# Patient Record
Sex: Male | Born: 1955 | Race: White | Hispanic: No | Marital: Single | Smoking: Current every day smoker
Health system: Southern US, Community
[De-identification: ages and names within clinical notes are randomized; demographics above are authoritative.]

## PROBLEM LIST (undated history)

## (undated) DIAGNOSIS — I4819 Other persistent atrial fibrillation: Secondary | ICD-10-CM

## (undated) DIAGNOSIS — K7460 Unspecified cirrhosis of liver: Secondary | ICD-10-CM

## (undated) DIAGNOSIS — I499 Cardiac arrhythmia, unspecified: Secondary | ICD-10-CM

## (undated) DIAGNOSIS — I10 Essential (primary) hypertension: Secondary | ICD-10-CM

## (undated) DIAGNOSIS — I495 Sick sinus syndrome: Secondary | ICD-10-CM

## (undated) DIAGNOSIS — B1920 Unspecified viral hepatitis C without hepatic coma: Secondary | ICD-10-CM

## (undated) DIAGNOSIS — S2249XA Multiple fractures of ribs, unspecified side, initial encounter for closed fracture: Secondary | ICD-10-CM

## (undated) DIAGNOSIS — J449 Chronic obstructive pulmonary disease, unspecified: Secondary | ICD-10-CM

## (undated) DIAGNOSIS — I4891 Unspecified atrial fibrillation: Secondary | ICD-10-CM

## (undated) DIAGNOSIS — I700 Atherosclerosis of aorta: Secondary | ICD-10-CM

## (undated) DIAGNOSIS — W19XXXA Unspecified fall, initial encounter: Secondary | ICD-10-CM

## (undated) DIAGNOSIS — J189 Pneumonia, unspecified organism: Secondary | ICD-10-CM

## (undated) DIAGNOSIS — I739 Peripheral vascular disease, unspecified: Secondary | ICD-10-CM

## (undated) DIAGNOSIS — R0600 Dyspnea, unspecified: Secondary | ICD-10-CM

## (undated) DIAGNOSIS — Z720 Tobacco use: Secondary | ICD-10-CM

## (undated) DIAGNOSIS — E278 Other specified disorders of adrenal gland: Secondary | ICD-10-CM

## (undated) DIAGNOSIS — C220 Liver cell carcinoma: Secondary | ICD-10-CM

## (undated) DIAGNOSIS — S2239XA Fracture of one rib, unspecified side, initial encounter for closed fracture: Secondary | ICD-10-CM

## (undated) HISTORY — DX: Unspecified viral hepatitis C without hepatic coma: B19.20

## (undated) HISTORY — DX: Tobacco use: Z72.0

## (undated) HISTORY — DX: Other persistent atrial fibrillation: I48.19

## (undated) HISTORY — DX: Unspecified cirrhosis of liver: K74.60

## (undated) SURGERY — Surgical Case
Anesthesia: *Unknown

---

## 1987-09-04 HISTORY — PX: OTHER SURGICAL HISTORY: SHX169

## 2009-09-03 HISTORY — PX: COLONOSCOPY: SHX174

## 2010-03-27 ENCOUNTER — Ambulatory Visit: Payer: Self-pay | Admitting: Gastroenterology

## 2010-03-27 DIAGNOSIS — M1990 Unspecified osteoarthritis, unspecified site: Secondary | ICD-10-CM

## 2010-03-27 DIAGNOSIS — R151 Fecal smearing: Secondary | ICD-10-CM | POA: Insufficient documentation

## 2010-03-27 DIAGNOSIS — Z8679 Personal history of other diseases of the circulatory system: Secondary | ICD-10-CM

## 2010-03-27 DIAGNOSIS — R972 Elevated prostate specific antigen [PSA]: Secondary | ICD-10-CM

## 2010-03-27 DIAGNOSIS — K625 Hemorrhage of anus and rectum: Secondary | ICD-10-CM

## 2010-03-28 ENCOUNTER — Encounter: Payer: Self-pay | Admitting: Gastroenterology

## 2010-03-28 ENCOUNTER — Encounter: Payer: Self-pay | Admitting: Urgent Care

## 2010-03-29 ENCOUNTER — Ambulatory Visit: Payer: Self-pay | Admitting: Gastroenterology

## 2010-03-29 ENCOUNTER — Ambulatory Visit (HOSPITAL_COMMUNITY): Admission: RE | Admit: 2010-03-29 | Discharge: 2010-03-29 | Payer: Self-pay | Admitting: Gastroenterology

## 2010-03-30 ENCOUNTER — Encounter: Payer: Self-pay | Admitting: Gastroenterology

## 2010-10-03 NOTE — Letter (Signed)
Summary: Internal Other Jarrett Ables for TCS  Internal Other Jarrett Ables for TCS   Imported By: Cloria Spring LPN 60/45/4098 11:91:47  _____________________________________________________________________  External Attachment:    Type:   Image     Comment:   External Document

## 2010-10-03 NOTE — Letter (Signed)
Summary: referral from debra allen,fnp  referral from debra allen,fnp   Imported By: Rosine Beat 03/28/2010 10:08:03  _____________________________________________________________________  External Attachment:    Type:   Image     Comment:   External Document
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
## 2010-10-03 NOTE — Medication Information (Signed)
Summary: Tax adviser   Imported By: Rexene Alberts 03/30/2010 10:35:02  _____________________________________________________________________  External Attachment:    Type:   Image     Comment:   External Document  Appended Document: RX Folder-change anusol    Prescriptions: HYDROCORTISONE ACETATE 25 MG SUPP (HYDROCORTISONE ACETATE) one pr two times a day for 30 days  #60 x 0   Entered and Authorized by:   Leanna Battles. Dixon Boos   Signed by:   Leanna Battles Aarion Metzgar PA-C on 03/30/2010   Method used:   Electronically to        [facility]  [address] (retail)       [address]       [place], [place]  [address]       Ph: [phone]       Fax: [phone]   RxID:   [id number]     Appended Document: RX Folder Please call pt. He had a two simple polyps removed from his colon. TCS in 5 years. High fiber diet.   Appended Document: RX Folder LM to call.  Appended Document: RX Folder Pt informed.  Appended Document: RX Folder reminder in computer

## 2013-05-27 ENCOUNTER — Emergency Department (HOSPITAL_COMMUNITY)
Admission: EM | Admit: 2013-05-27 | Discharge: 2013-05-28 | Disposition: A | Discharge status: Home / Self Care | Payer: Self-pay | Attending: Emergency Medicine | Admitting: Emergency Medicine

## 2013-05-27 ENCOUNTER — Encounter (HOSPITAL_COMMUNITY): Payer: Self-pay

## 2013-05-27 DIAGNOSIS — Y939 Activity, unspecified: Secondary | ICD-10-CM | POA: Insufficient documentation

## 2013-05-27 DIAGNOSIS — W010XXA Fall on same level from slipping, tripping and stumbling without subsequent striking against object, initial encounter: Secondary | ICD-10-CM | POA: Insufficient documentation

## 2013-05-27 DIAGNOSIS — S43109A Unspecified dislocation of unspecified acromioclavicular joint, initial encounter: Secondary | ICD-10-CM | POA: Insufficient documentation

## 2013-05-27 DIAGNOSIS — Y929 Unspecified place or not applicable: Secondary | ICD-10-CM | POA: Insufficient documentation

## 2013-05-27 DIAGNOSIS — S43004A Unspecified dislocation of right shoulder joint, initial encounter: Secondary | ICD-10-CM

## 2013-05-27 HISTORY — DX: Essential (primary) hypertension: I10
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
## 2013-05-27 NOTE — ED Notes (Signed)
Pt reports he fell onto his right shoulder area approx 1 hour ago, having pain and decreased range of motion to same.

## 2013-05-28 ENCOUNTER — Emergency Department (HOSPITAL_COMMUNITY): Payer: Self-pay

## 2013-05-28 MED ORDER — HYDROCODONE-ACETAMINOPHEN 5-325 MG PO TABS: 2.0000 | ORAL_TABLET | ORAL | Status: DC | PRN

## 2013-05-28 MED ORDER — HYDROCODONE-ACETAMINOPHEN 5-325 MG PO TABS
2.0000 | ORAL_TABLET | Freq: Once | ORAL | Status: AC
  Administered 2013-05-28: 2 via ORAL
  Filled 2013-05-28: qty 2

## 2015-02-05 ENCOUNTER — Emergency Department (HOSPITAL_COMMUNITY): Payer: Medicaid Other

## 2015-02-05 ENCOUNTER — Encounter (HOSPITAL_COMMUNITY): Payer: Self-pay | Admitting: *Deleted

## 2015-02-05 ENCOUNTER — Emergency Department (HOSPITAL_COMMUNITY)
Admission: EM | Admit: 2015-02-05 | Discharge: 2015-02-05 | Disposition: A | Discharge status: Home / Self Care | Payer: Medicaid Other | Attending: Emergency Medicine | Admitting: Emergency Medicine

## 2015-02-05 DIAGNOSIS — Z89512 Acquired absence of left leg below knee: Secondary | ICD-10-CM | POA: Diagnosis not present

## 2015-02-05 DIAGNOSIS — R079 Chest pain, unspecified: Secondary | ICD-10-CM

## 2015-02-05 DIAGNOSIS — I10 Essential (primary) hypertension: Secondary | ICD-10-CM | POA: Diagnosis not present

## 2015-02-05 DIAGNOSIS — S42002A Fracture of unspecified part of left clavicle, initial encounter for closed fracture: Secondary | ICD-10-CM

## 2015-02-05 DIAGNOSIS — S42022A Displaced fracture of shaft of left clavicle, initial encounter for closed fracture: Secondary | ICD-10-CM | POA: Diagnosis not present

## 2015-02-05 DIAGNOSIS — Y92410 Unspecified street and highway as the place of occurrence of the external cause: Secondary | ICD-10-CM | POA: Diagnosis not present

## 2015-02-05 DIAGNOSIS — Y9389 Activity, other specified: Secondary | ICD-10-CM | POA: Diagnosis not present

## 2015-02-05 DIAGNOSIS — Z720 Tobacco use: Secondary | ICD-10-CM | POA: Insufficient documentation

## 2015-02-05 DIAGNOSIS — S2242XA Multiple fractures of ribs, left side, initial encounter for closed fracture: Secondary | ICD-10-CM | POA: Insufficient documentation

## 2015-02-05 DIAGNOSIS — S2232XA Fracture of one rib, left side, initial encounter for closed fracture: Secondary | ICD-10-CM

## 2015-02-05 DIAGNOSIS — Y998 Other external cause status: Secondary | ICD-10-CM | POA: Insufficient documentation

## 2015-02-05 DIAGNOSIS — S29001A Unspecified injury of muscle and tendon of front wall of thorax, initial encounter: Secondary | ICD-10-CM | POA: Diagnosis present

## 2015-02-05 LAB — CBC WITH DIFFERENTIAL/PLATELET
BASOS PCT: 1 % (ref 0–1)
Basophils Absolute: 0.1 10*3/uL (ref 0.0–0.1)
Eosinophils Absolute: 0 10*3/uL (ref 0.0–0.7)
Eosinophils Relative: 0 % (ref 0–5)
HEMATOCRIT: 50.3 % (ref 39.0–52.0)
Hemoglobin: 17.4 g/dL — ABNORMAL HIGH (ref 13.0–17.0)
LYMPHS ABS: 1.5 10*3/uL (ref 0.7–4.0)
Lymphocytes Relative: 15 % (ref 12–46)
MCH: 38.2 pg — AB (ref 26.0–34.0)
MCHC: 34.6 g/dL (ref 30.0–36.0)
MCV: 110.5 fL — AB (ref 78.0–100.0)
Monocytes Absolute: 0.7 10*3/uL (ref 0.1–1.0)
Monocytes Relative: 7 % (ref 3–12)
Neutro Abs: 8 10*3/uL — ABNORMAL HIGH (ref 1.7–7.7)
Neutrophils Relative %: 78 % — ABNORMAL HIGH (ref 43–77)
PLATELETS: 162 10*3/uL (ref 150–400)
RBC: 4.55 MIL/uL (ref 4.22–5.81)
RDW: 13.1 % (ref 11.5–15.5)
WBC: 10.3 10*3/uL (ref 4.0–10.5)

## 2015-02-05 LAB — BASIC METABOLIC PANEL
Anion gap: 14 (ref 5–15)
BUN: 7 mg/dL (ref 6–20)
CALCIUM: 8.8 mg/dL — AB (ref 8.9–10.3)
CHLORIDE: 104 mmol/L (ref 101–111)
CO2: 22 mmol/L (ref 22–32)
CREATININE: 0.99 mg/dL (ref 0.61–1.24)
GFR calc Af Amer: >60 mL/min (ref >60)
GFR calc non Af Amer: >60 mL/min (ref >60)
GLUCOSE: 178 mg/dL — AB (ref 65–99)
POTASSIUM: 3.8 mmol/L (ref 3.5–5.1)
Sodium: 140 mmol/L (ref 135–145)

## 2015-02-05 LAB — ETHANOL: ALCOHOL ETHYL (B): 361 mg/dL — AB (ref <5)

## 2015-02-05 MED ORDER — MORPHINE SULFATE 4 MG/ML IJ SOLN
4.0000 mg | Freq: Once | INTRAMUSCULAR | Status: AC
  Administered 2015-02-05: 4 mg via INTRAVENOUS

## 2015-02-05 MED ORDER — MORPHINE SULFATE 4 MG/ML IJ SOLN
4.0000 mg | INTRAMUSCULAR | Status: DC | PRN
  Administered 2015-02-05: 4 mg via INTRAVENOUS
  Filled 2015-02-05 (×2): qty 1

## 2015-02-05 MED ORDER — OXYCODONE-ACETAMINOPHEN 5-325 MG PO TABS: 2.0000 | ORAL_TABLET | ORAL | Status: DC | PRN

## 2015-02-05 MED ORDER — ONDANSETRON HCL 4 MG/2ML IJ SOLN
4.0000 mg | Freq: Once | INTRAMUSCULAR | Status: AC
  Administered 2015-02-05: 4 mg via INTRAVENOUS
  Filled 2015-02-05: qty 2
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
## 2015-02-05 NOTE — ED Notes (Signed)
Crepitus noted on left side.
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
## 2015-02-05 NOTE — Discharge Instructions (Signed)
Clavicle Fracture °The clavicle, also called the collarbone, is the long bone that connects your shoulder to your rib cage. You can feel your collarbone at the top of your shoulders and rib cage. A clavicle fracture is a broken clavicle. It is a common injury that can happen at any age.  °CAUSES °Common causes of a clavicle fracture include: °· A direct blow to your shoulder. °· A car accident. °· A fall, especially if you try to break your fall with an outstretched arm. °RISK FACTORS °You may be at increased risk if: °· You are younger than 25 years or older than 75 years. Most clavicle fractures happen to people who are younger than 25 years. °· You are a male. °· You play contact sports. °SIGNS AND SYMPTOMS °A fractured clavicle is painful. It also makes it hard to move your arm. Other signs and symptoms may include: °· A shoulder that drops downward and forward. °· Pain when trying to lift your shoulder. °· Bruising, swelling, and tenderness over your clavicle. °· A grinding noise when you try to move your shoulder. °· A bump over your clavicle. °DIAGNOSIS °Your health care provider can usually diagnose a clavicle fracture by asking about your injury and examining your shoulder and clavicle. He or she may take an X-ray to determine the position of your clavicle. °TREATMENT °Treatment depends on the position of your clavicle after the fracture: °· If the broken ends of the bone are not out of place, your health care provider may put your arm in a sling or wrap a support bandage around your chest (figure-of-eight wrap). °· If the broken ends of the bone are out of place, you may need surgery. Surgery may involve placing screws, pins, or plates to keep your clavicle stable while it heals. Healing may take about 3 months. °When your health care provider thinks your fracture has healed enough, you may have to do physical therapy to regain normal movement and build up your arm strength. °HOME CARE INSTRUCTIONS   °· Apply ice to the injured area: °¨ Put ice in a plastic bag. °¨ Place a towel between your skin and the bag. °¨ Leave the ice on for 20 minutes, 2-3 times a day. °· If you have a wrap or splint: °¨ Wear it all the time, and remove it only to take a bath or shower. °¨ When you bathe or shower, keep your shoulder in the same position as when the sling or wrap is on. °¨ Do not lift your arm. °· If you have a figure-of-eight wrap: °¨ Another person must tighten it every day. °¨ It should be tight enough to hold your shoulders back. °¨ Allow enough room to place your index finger between your body and the strap. °¨ Loosen the wrap immediately if you feel numbness or tingling in your hands. °· Only take medicines as directed by your health care provider. °· Avoid activities that make the injury or pain worse for 4-6 weeks after surgery. °· Keep all follow-up appointments. °SEEK MEDICAL CARE IF:  °Your medicine is not helping to relieve pain and swelling. °SEEK IMMEDIATE MEDICAL CARE IF:  °Your arm is numb, cold, or pale, even when the splint is loose. °MAKE SURE YOU:  °· Understand these instructions. °· Will watch your condition. °· Will get help right away if you are not doing well or get worse. °Document Released: 05/30/2005 Document Revised: 08/25/2013 Document Reviewed: 07/13/2013 °[employer]® Patient Information ©2015 [employer]. This information is   not intended to replace advice given to you by your health care provider. Make sure you discuss any questions you have with your health care provider.  Rib Fracture A rib fracture is a break or crack in one of the bones of the ribs. The ribs are like a cage that goes around your upper chest. A broken or cracked rib is often painful, but most do not cause other problems. Most rib fractures heal on their own in 1-3 months. HOME CARE  Avoid activities that cause pain to the injured area. Protect your injured area.  Slowly increase activity as told by your  doctor.  Take medicine as told by your doctor.  Put ice on the injured area for the first 1-2 days after you have been treated or as told by your doctor.  Put ice in a plastic bag.  Place a towel between your skin and the bag.  Leave the ice on for 15-20 minutes at a time, every 2 hours while you are awake.  Do deep breathing as told by your doctor. You may be told to:  Take deep breaths many times a day.  Cough many times a day while hugging a pillow.  Use a device (incentive spirometer) to perform deep breathing many times a day.  Drink enough fluids to keep your pee (urine) clear or pale yellow.   Do not wear a rib belt or binder. These do not allow you to breathe deeply. GET HELP RIGHT AWAY IF:   You have a fever.  You have trouble breathing.   You cannot stop coughing.  You cough up thick or bloody spit (mucus).   You feel sick to your stomach (nauseous), throw up (vomit), or have belly (abdominal) pain.   Your pain gets worse and medicine does not help.  MAKE SURE YOU:   Understand these instructions.  Will watch your condition.  Will get help right away if you are not doing well or get worse. Document Released: 05/29/2008 Document Revised: 12/15/2012 Document Reviewed: 10/22/2012 [facility] Patient Information 2015 [place], [place]. This information is not intended to replace advice given to you by your health care provider. Make sure you discuss any questions you have with your health care provider.

## 2015-03-21 ENCOUNTER — Encounter: Payer: Self-pay | Admitting: Gastroenterology

## 2015-03-21 ENCOUNTER — Encounter: Payer: Self-pay | Admitting: Internal Medicine

## 2015-12-08 ENCOUNTER — Other Ambulatory Visit (HOSPITAL_COMMUNITY)
Admission: RE | Admit: 2015-12-08 | Discharge: 2015-12-08 | Disposition: A | Discharge status: Home / Self Care | Payer: Medicaid Other | Source: Ambulatory Visit | Attending: Internal Medicine | Admitting: Internal Medicine

## 2015-12-08 DIAGNOSIS — B1710 Acute hepatitis C without hepatic coma: Secondary | ICD-10-CM | POA: Insufficient documentation

## 2015-12-11 LAB — HEPATITIS C GENOTYPE

## 2016-03-23 ENCOUNTER — Emergency Department (HOSPITAL_COMMUNITY): Payer: Medicaid Other

## 2016-03-23 ENCOUNTER — Inpatient Hospital Stay (HOSPITAL_COMMUNITY)
Admission: EM | Admit: 2016-03-23 | Discharge: 2016-03-25 | DRG: 310 | Disposition: A | Discharge status: Home / Self Care | Payer: Medicaid Other | Attending: Internal Medicine | Admitting: Internal Medicine

## 2016-03-23 ENCOUNTER — Encounter (HOSPITAL_COMMUNITY): Payer: Self-pay | Admitting: Emergency Medicine

## 2016-03-23 DIAGNOSIS — M1990 Unspecified osteoarthritis, unspecified site: Secondary | ICD-10-CM | POA: Diagnosis present

## 2016-03-23 DIAGNOSIS — Z86711 Personal history of pulmonary embolism: Secondary | ICD-10-CM | POA: Diagnosis not present

## 2016-03-23 DIAGNOSIS — E876 Hypokalemia: Secondary | ICD-10-CM | POA: Diagnosis present

## 2016-03-23 DIAGNOSIS — Z8679 Personal history of other diseases of the circulatory system: Secondary | ICD-10-CM | POA: Diagnosis not present

## 2016-03-23 DIAGNOSIS — I952 Hypotension due to drugs: Secondary | ICD-10-CM | POA: Diagnosis present

## 2016-03-23 DIAGNOSIS — I4891 Unspecified atrial fibrillation: Principal | ICD-10-CM | POA: Diagnosis present

## 2016-03-23 DIAGNOSIS — Z8249 Family history of ischemic heart disease and other diseases of the circulatory system: Secondary | ICD-10-CM

## 2016-03-23 DIAGNOSIS — Z89512 Acquired absence of left leg below knee: Secondary | ICD-10-CM | POA: Diagnosis not present

## 2016-03-23 DIAGNOSIS — Z720 Tobacco use: Secondary | ICD-10-CM | POA: Diagnosis present

## 2016-03-23 DIAGNOSIS — I10 Essential (primary) hypertension: Secondary | ICD-10-CM | POA: Diagnosis present

## 2016-03-23 DIAGNOSIS — T461X5A Adverse effect of calcium-channel blockers, initial encounter: Secondary | ICD-10-CM | POA: Diagnosis present

## 2016-03-23 DIAGNOSIS — F17210 Nicotine dependence, cigarettes, uncomplicated: Secondary | ICD-10-CM | POA: Diagnosis present

## 2016-03-23 DIAGNOSIS — I959 Hypotension, unspecified: Secondary | ICD-10-CM | POA: Diagnosis not present

## 2016-03-23 HISTORY — DX: Unspecified atrial fibrillation: I48.91

## 2016-03-23 LAB — BASIC METABOLIC PANEL
ANION GAP: 6 (ref 5–15)
BUN: 12 mg/dL (ref 6–20)
CHLORIDE: 104 mmol/L (ref 101–111)
CO2: 24 mmol/L (ref 22–32)
Calcium: 8.6 mg/dL — ABNORMAL LOW (ref 8.9–10.3)
Creatinine, Ser: 1.27 mg/dL — ABNORMAL HIGH (ref 0.61–1.24)
GFR calc non Af Amer: 60 mL/min — ABNORMAL LOW (ref >60)
Glucose, Bld: 98 mg/dL (ref 65–99)
POTASSIUM: 3.2 mmol/L — AB (ref 3.5–5.1)
Sodium: 134 mmol/L — ABNORMAL LOW (ref 135–145)

## 2016-03-23 LAB — CBC
HCT: 45.6 % (ref 39.0–52.0)
HEMOGLOBIN: 16.1 g/dL (ref 13.0–17.0)
MCH: 35.3 pg — AB (ref 26.0–34.0)
MCHC: 35.3 g/dL (ref 30.0–36.0)
MCV: 100 fL (ref 78.0–100.0)
Platelets: 236 10*3/uL (ref 150–400)
RBC: 4.56 MIL/uL (ref 4.22–5.81)
RDW: 13.4 % (ref 11.5–15.5)
WBC: 7.3 10*3/uL (ref 4.0–10.5)

## 2016-03-23 LAB — TROPONIN I: Troponin I: <0.03 ng/mL (ref <0.03)

## 2016-03-23 MED ORDER — DILTIAZEM LOAD VIA INFUSION
10.0000 mg | Freq: Once | INTRAVENOUS | Status: AC
  Administered 2016-03-23: 10 mg via INTRAVENOUS

## 2016-03-23 MED ORDER — DILTIAZEM LOAD VIA INFUSION
10.0000 mg | Freq: Once | INTRAVENOUS | Status: AC
  Administered 2016-03-23: 10 mg via INTRAVENOUS
  Filled 2016-03-23: qty 10

## 2016-03-23 MED ORDER — SODIUM CHLORIDE 0.9% FLUSH
3.0000 mL | Freq: Two times a day (BID) | INTRAVENOUS | Status: DC
  Administered 2016-03-24 – 2016-03-25 (×2): 3 mL via INTRAVENOUS

## 2016-03-23 MED ORDER — VITAMIN D 1000 UNITS PO TABS
2000.0000 [IU] | ORAL_TABLET | Freq: Every day | ORAL | Status: DC
Start: 2016-03-24 — End: 2016-03-25
  Administered 2016-03-24 – 2016-03-25 (×2): 2000 [IU] via ORAL
  Filled 2016-03-23 (×2): qty 2

## 2016-03-23 MED ORDER — MONTELUKAST SODIUM 10 MG PO TABS
10.0000 mg | ORAL_TABLET | Freq: Every day | ORAL | Status: DC
  Administered 2016-03-24 – 2016-03-25 (×2): 10 mg via ORAL
  Filled 2016-03-23 (×2): qty 1

## 2016-03-23 MED ORDER — SODIUM CHLORIDE 0.9 % IV SOLN
INTRAVENOUS | Status: DC
  Administered 2016-03-23: 23:00:00 via INTRAVENOUS

## 2016-03-23 MED ORDER — ENOXAPARIN SODIUM 80 MG/0.8ML ~~LOC~~ SOLN
1.0000 mg/kg | Freq: Two times a day (BID) | SUBCUTANEOUS | Status: DC
  Administered 2016-03-23 – 2016-03-24 (×2): 70 mg via SUBCUTANEOUS
  Filled 2016-03-23 (×2): qty 0.8

## 2016-03-23 MED ORDER — ONDANSETRON HCL 4 MG PO TABS: 4.0000 mg | ORAL_TABLET | Freq: Four times a day (QID) | ORAL | Status: DC | PRN

## 2016-03-23 MED ORDER — ATENOLOL 25 MG PO TABS
100.0000 mg | ORAL_TABLET | Freq: Every day | ORAL | Status: DC
  Filled 2016-03-23: qty 4

## 2016-03-23 MED ORDER — VITAMIN D 50 MCG (2000 UT) PO CAPS: 1.0000 | ORAL_CAPSULE | Freq: Every day | ORAL | Status: DC

## 2016-03-23 MED ORDER — ONDANSETRON HCL 4 MG/2ML IJ SOLN: 4.0000 mg | Freq: Four times a day (QID) | INTRAMUSCULAR | Status: DC | PRN

## 2016-03-23 MED ORDER — PNEUMOCOCCAL VAC POLYVALENT 25 MCG/0.5ML IJ INJ
0.5000 mL | INJECTION | INTRAMUSCULAR | Status: AC
Start: 2016-03-24 — End: 2016-03-24
  Administered 2016-03-24: 0.5 mL via INTRAMUSCULAR
  Filled 2016-03-23: qty 0.5

## 2016-03-23 MED ORDER — ENOXAPARIN SODIUM 80 MG/0.8ML ~~LOC~~ SOLN: 1.0000 mg/kg | Freq: Two times a day (BID) | SUBCUTANEOUS | Status: DC

## 2016-03-23 MED ORDER — DEXTROSE 5 % IV SOLN
5.0000 mg/h | INTRAVENOUS | Status: DC
  Administered 2016-03-23 – 2016-03-24 (×2): 5 mg/h via INTRAVENOUS
  Filled 2016-03-23 (×2): qty 100

## 2016-03-23 MED ORDER — POTASSIUM CHLORIDE CRYS ER 20 MEQ PO TBCR
40.0000 meq | EXTENDED_RELEASE_TABLET | Freq: Once | ORAL | Status: AC
  Administered 2016-03-23: 40 meq via ORAL
  Filled 2016-03-23: qty 2

## 2016-03-23 MED ORDER — ALBUTEROL SULFATE (2.5 MG/3ML) 0.083% IN NEBU: 3.0000 mL | INHALATION_SOLUTION | RESPIRATORY_TRACT | Status: DC | PRN

## 2016-03-23 MED ORDER — SODIUM CHLORIDE 0.9 % IV BOLUS (SEPSIS)
1000.0000 mL | Freq: Once | INTRAVENOUS | Status: AC
  Administered 2016-03-23: 1000 mL via INTRAVENOUS

## 2016-03-23 MED ORDER — ASPIRIN EC 81 MG PO TBEC
81.0000 mg | DELAYED_RELEASE_TABLET | Freq: Every day | ORAL | Status: DC
  Administered 2016-03-23 – 2016-03-25 (×3): 81 mg via ORAL
  Filled 2016-03-23 (×3): qty 1

## 2016-03-23 NOTE — ED Provider Notes (Signed)
CSN: [id number]     Arrival date & time 03/23/16  1821 History   First MD Initiated Contact with Patient 03/23/16 1846     Chief Complaint  Patient presents with  . Weakness     (Consider location/radiation/quality/duration/timing/severity/associated sxs/prior Treatment) HPI...Alejandro KitchenMarland KitchenPatient went to primary care doctor this week for routine testing for generalized weakness for 2 months. An EKG was performed. He was instructed to come to the emergency department for an abnormal EKG. He has never been in atrial fibrillation before. No chest pain or dyspnea. No prodromal illnesses. Nothing makes symptoms better or worse.  Past Medical History  Diagnosis Date  . Hypertension    Past Surgical History  Procedure Laterality Date  . Left bka     Family History  Problem Relation Age of Onset  . Hypertension Mother    Social History  Substance Use Topics  . Smoking status: Current Every Day Smoker -- 1.00 packs/day    Types: Cigarettes  . Smokeless tobacco: None  . Alcohol Use: No    Review of Systems    Allergies  Review of patient's allergies indicates no known allergies.  Home Medications   Prior to Admission medications   Medication Sig Start Date End Date Taking? Authorizing Provider  atenolol-chlorthalidone (TENORETIC) 100-25 MG tablet Take 1 tablet by mouth daily.   Yes Historical Provider, MD  Cholecalciferol (VITAMIN D) 2000 units CAPS Take 1 capsule by mouth daily.   Yes Historical Provider, MD  hydrOXYzine (VISTARIL) 50 MG capsule Take 50 mg by mouth 2 (two) times daily as needed (for rest).  03/19/16  Yes Historical Provider, MD  montelukast (SINGULAIR) 10 MG tablet Take 10 mg by mouth daily.   Yes Historical Provider, MD  Multiple Vitamins-Minerals (CENTRUM SILVER 50+MEN) TABS Take 1 tablet by mouth daily.   Yes Historical Provider, MD  PROAIR HFA 108 (90 Base) MCG/ACT inhaler Inhale 1-2 puffs into the lungs every 4 (four) hours as needed. 03/19/16  Yes Historical  Provider, MD   BP 105/84 mmHg  Pulse 138  Temp(Src) 98.5 F (36.9 C) (Oral)  Resp 15  Ht 5\' 9"  (1.753 m)  Wt 159 lb (72.122 kg)  BMI 23.47 kg/m2  SpO2 96% Physical Exam  Constitutional: He is oriented to person, place, and time. He appears well-developed and well-nourished.  HENT:  Head: Normocephalic and atraumatic.  Eyes: Conjunctivae are normal.  Neck: Neck supple.  Cardiovascular:  Tachycardic, irregularly irregular rhythm.  Pulmonary/Chest: Effort normal and breath sounds normal.  Abdominal: Soft. Bowel sounds are normal.  Musculoskeletal: Normal range of motion.  Neurological: He is alert and oriented to person, place, and time.  Skin: Skin is warm and dry.  Psychiatric: He has a normal mood and affect. His behavior is normal.  Nursing note and vitals reviewed.   ED Course  Procedures (including critical care time) Labs Review Labs Reviewed  BASIC METABOLIC PANEL - Abnormal; Notable for the following:    Sodium 134 (*)    Potassium 3.2 (*)    Creatinine, Ser 1.27 (*)    Calcium 8.6 (*)    GFR calc non Af Amer 60 (*)    All other components within normal limits  CBC - Abnormal; Notable for the following:    MCH 35.3 (*)    All other components within normal limits  TROPONIN I    Imaging Review No results found. I have personally reviewed and evaluated these images and lab results as part of my medical decision-making.   EKG  Interpretation None     CRITICAL CARE Performed by: Nat Christen Total critical care time: 30 minutes Critical care time was exclusive of separately billable procedures and treating other patients. Critical care was necessary to treat or prevent imminent or life-threatening deterioration. Critical care was time spent personally by me on the following activities: development of treatment plan with patient and/or surrogate as well as nursing, discussions with consultants, evaluation of patient's response to treatment, examination of  patient, obtaining history from patient or surrogate, ordering and performing treatments and interventions, ordering and review of laboratory studies, ordering and review of radiographic studies, pulse oximetry and re-evaluation of patient's condition. MDM   Final diagnoses:  Atrial fibrillation with rapid ventricular response ([place])    Patient is in atrial fibrillation with RVR. We'll start intravenous Cardizem with bolus and drip. Admit to general medicine. He is hemodynamically stable.    Nat Christen, MD 03/23/16 2023
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
## 2016-03-23 NOTE — ED Notes (Signed)
Sent by Dr Marlou Sa.  Pt states MD told him to come to er today, that he saw something that didn't look right on his ekg.  Pt denies cp or sob.  States he went to dr for generalized weakness he has been having for approx 2 months.

## 2016-03-24 ENCOUNTER — Inpatient Hospital Stay (HOSPITAL_COMMUNITY): Payer: Medicaid Other

## 2016-03-24 DIAGNOSIS — Z720 Tobacco use: Secondary | ICD-10-CM | POA: Diagnosis present

## 2016-03-24 DIAGNOSIS — I4891 Unspecified atrial fibrillation: Secondary | ICD-10-CM

## 2016-03-24 DIAGNOSIS — I10 Essential (primary) hypertension: Secondary | ICD-10-CM | POA: Diagnosis present

## 2016-03-24 DIAGNOSIS — I959 Hypotension, unspecified: Secondary | ICD-10-CM | POA: Diagnosis not present

## 2016-03-24 DIAGNOSIS — I952 Hypotension due to drugs: Secondary | ICD-10-CM

## 2016-03-24 DIAGNOSIS — E876 Hypokalemia: Secondary | ICD-10-CM

## 2016-03-24 LAB — CBC
HCT: 40.3 % (ref 39.0–52.0)
HEMOGLOBIN: 14.1 g/dL (ref 13.0–17.0)
MCH: 35.4 pg — AB (ref 26.0–34.0)
MCHC: 35 g/dL (ref 30.0–36.0)
MCV: 101.3 fL — ABNORMAL HIGH (ref 78.0–100.0)
PLATELETS: 177 10*3/uL (ref 150–400)
RBC: 3.98 MIL/uL — ABNORMAL LOW (ref 4.22–5.81)
RDW: 13.7 % (ref 11.5–15.5)
WBC: 6.3 10*3/uL (ref 4.0–10.5)

## 2016-03-24 LAB — COMPREHENSIVE METABOLIC PANEL
ALBUMIN: 2.9 g/dL — AB (ref 3.5–5.0)
ALK PHOS: 67 U/L (ref 38–126)
ALT: 26 U/L (ref 17–63)
ANION GAP: 3 — AB (ref 5–15)
AST: 33 U/L (ref 15–41)
BILIRUBIN TOTAL: 0.5 mg/dL (ref 0.3–1.2)
BUN: 9 mg/dL (ref 6–20)
CALCIUM: 8.1 mg/dL — AB (ref 8.9–10.3)
CO2: 28 mmol/L (ref 22–32)
CREATININE: 0.84 mg/dL (ref 0.61–1.24)
Chloride: 106 mmol/L (ref 101–111)
GFR calc Af Amer: >60 mL/min (ref >60)
GFR calc non Af Amer: >60 mL/min (ref >60)
GLUCOSE: 74 mg/dL (ref 65–99)
Potassium: 3.4 mmol/L — ABNORMAL LOW (ref 3.5–5.1)
Sodium: 137 mmol/L (ref 135–145)
TOTAL PROTEIN: 6.1 g/dL — AB (ref 6.5–8.1)

## 2016-03-24 LAB — ECHOCARDIOGRAM COMPLETE
CHL CUP MV DEC (S): 116
E decel time: 116 msec
E/e' ratio: 6.6
FS: 19 % — AB (ref 28–44)
Height: 69 in
IV/PV OW: 0.97
LA diam index: 1.83 cm/m2
LA vol A4C: 46 ml
LA vol: 39.4 mL
LASIZE: 34 mm
LAVOLIN: 21.2 mL/m2
LEFT ATRIUM END SYS DIAM: 34 mm
LV TDI E'MEDIAL: 11.2
LV e' LATERAL: 12.2 cm/s
LVEEAVG: 6.6
LVEEMED: 6.6
MV pk E vel: 80.5 m/s
MVPG: 3 mmHg
PW: 14.4 mm — AB (ref 0.6–1.1)
TAPSE: 18.9 mm
TDI e' lateral: 12.2
Weight: 2504.43 oz

## 2016-03-24 LAB — MAGNESIUM: MAGNESIUM: 1.6 mg/dL — AB (ref 1.7–2.4)

## 2016-03-24 LAB — MRSA PCR SCREENING: MRSA by PCR: NEGATIVE

## 2016-03-24 LAB — TROPONIN I

## 2016-03-24 LAB — TSH: TSH: 0.811 u[IU]/mL (ref 0.350–4.500)

## 2016-03-24 MED ORDER — ENOXAPARIN SODIUM 40 MG/0.4ML ~~LOC~~ SOLN
40.0000 mg | SUBCUTANEOUS | Status: DC
  Administered 2016-03-25: 40 mg via SUBCUTANEOUS
  Filled 2016-03-24: qty 0.4

## 2016-03-24 MED ORDER — POTASSIUM CHLORIDE IN NACL 20-0.9 MEQ/L-% IV SOLN
INTRAVENOUS | Status: DC
  Administered 2016-03-24 – 2016-03-25 (×2): via INTRAVENOUS

## 2016-03-24 MED ORDER — POTASSIUM CHLORIDE CRYS ER 20 MEQ PO TBCR
20.0000 meq | EXTENDED_RELEASE_TABLET | Freq: Two times a day (BID) | ORAL | Status: DC
  Administered 2016-03-24 – 2016-03-25 (×3): 20 meq via ORAL
  Filled 2016-03-24 (×3): qty 1

## 2016-03-24 MED ORDER — NICOTINE 21 MG/24HR TD PT24
21.0000 mg | MEDICATED_PATCH | Freq: Every day | TRANSDERMAL | Status: DC
  Administered 2016-03-24 – 2016-03-25 (×2): 21 mg via TRANSDERMAL
  Filled 2016-03-24 (×2): qty 1

## 2016-03-24 MED ORDER — SODIUM CHLORIDE 0.9 % IV SOLN
INTRAVENOUS | Status: DC
  Administered 2016-03-24: 01:00:00 via INTRAVENOUS

## 2016-03-24 MED ORDER — SODIUM CHLORIDE 0.9 % IV BOLUS (SEPSIS)
1000.0000 mL | Freq: Once | INTRAVENOUS | Status: AC
  Administered 2016-03-24: 1000 mL via INTRAVENOUS

## 2016-03-24 MED ORDER — DIGOXIN 0.25 MG/ML IJ SOLN
0.2500 mg | Freq: Once | INTRAMUSCULAR | Status: AC
  Administered 2016-03-24: 0.25 mg via INTRAVENOUS
  Filled 2016-03-24: qty 2

## 2016-03-24 NOTE — Progress Notes (Signed)
**Note De-Identified Jasan Doughtie Obfuscation** EKG complete and reported to RN
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
## 2016-03-24 NOTE — Progress Notes (Signed)
*  PRELIMINARY RESULTS* Echocardiogram 2D Echocardiogram has been performed.  Alejandro Rice 03/24/2016, 10:00 AM

## 2016-03-25 ENCOUNTER — Encounter (HOSPITAL_COMMUNITY): Payer: Self-pay | Admitting: Internal Medicine

## 2016-03-25 LAB — BASIC METABOLIC PANEL
Anion gap: 3 — ABNORMAL LOW (ref 5–15)
BUN: 10 mg/dL (ref 6–20)
CO2: 28 mmol/L (ref 22–32)
CREATININE: 0.82 mg/dL (ref 0.61–1.24)
Calcium: 8.5 mg/dL — ABNORMAL LOW (ref 8.9–10.3)
Chloride: 107 mmol/L (ref 101–111)
Glucose, Bld: 80 mg/dL (ref 65–99)
POTASSIUM: 4.1 mmol/L (ref 3.5–5.1)
SODIUM: 138 mmol/L (ref 135–145)

## 2016-03-25 MED ORDER — ATENOLOL 25 MG PO TABS: 25.0000 mg | ORAL_TABLET | Freq: Every day | ORAL | 3 refills | Status: DC

## 2016-03-25 MED ORDER — DILTIAZEM HCL ER COATED BEADS 120 MG PO CP24
120.0000 mg | ORAL_CAPSULE | Freq: Every day | ORAL | Status: DC
  Administered 2016-03-25: 120 mg via ORAL
  Filled 2016-03-25: qty 1

## 2016-03-25 MED ORDER — ASPIRIN 81 MG PO TBEC: 81.0000 mg | DELAYED_RELEASE_TABLET | Freq: Every day | ORAL | Status: AC

## 2016-03-25 MED ORDER — MAGNESIUM OXIDE 400 (241.3 MG) MG PO TABS
200.0000 mg | ORAL_TABLET | Freq: Two times a day (BID) | ORAL | Status: DC
  Administered 2016-03-25: 200 mg via ORAL
  Filled 2016-03-25: qty 1

## 2016-03-25 MED ORDER — MAGNESIUM OXIDE 400 (241.3 MG) MG PO TABS: 400.0000 mg | ORAL_TABLET | Freq: Every day | ORAL | Status: DC

## 2016-03-25 MED ORDER — DILTIAZEM HCL ER COATED BEADS 180 MG PO CP24: 180.0000 mg | ORAL_CAPSULE | Freq: Every day | ORAL | 3 refills | Status: DC
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
## 2016-03-25 NOTE — Progress Notes (Signed)
**Note De-identified Binnie Droessler Obfuscation** EKG complete; reported to RN 

## 2016-03-25 NOTE — Progress Notes (Signed)
Pt given after discussion & understanding discharge instructions. IV discontinued. Awaiting family to arrive for transportation home

## 2016-03-25 NOTE — Care Management Note (Signed)
Case Management Note  Patient Details  Name: Alejandro Rice MRN: [id number] Date of Birth: May 14, 1956  Subjective/Objective:          Spoke with patient for discharge planning.  Patient will return home and provide self care.  No CM needs identified.            Action/Plan:   Expected Discharge Date:   03/25/16               Expected Discharge Plan:  Home/Self Care  In-House Referral:  NA  Discharge planning Services  CM Consult  Post Acute Care Choice:  NA Choice offered to:     DME Arranged:    DME Agency:     HH Arranged:    HH Agency:     Status of Service:  Completed, signed off  If discussed at [employer] of Stay Meetings, dates discussed:    Additional Comments:  [name], RN 03/25/2016, 2:46 PM

## 2016-04-29 ENCOUNTER — Observation Stay (HOSPITAL_COMMUNITY)
Admission: EM | Admit: 2016-04-29 | Discharge: 2016-05-01 | Disposition: A | Discharge status: Home / Self Care | Payer: Medicaid Other | Attending: Surgery | Admitting: Surgery

## 2016-04-29 ENCOUNTER — Encounter (HOSPITAL_COMMUNITY): Payer: Self-pay | Admitting: Emergency Medicine

## 2016-04-29 ENCOUNTER — Emergency Department (HOSPITAL_COMMUNITY): Payer: Medicaid Other

## 2016-04-29 DIAGNOSIS — M1990 Unspecified osteoarthritis, unspecified site: Secondary | ICD-10-CM | POA: Insufficient documentation

## 2016-04-29 DIAGNOSIS — Z86711 Personal history of pulmonary embolism: Secondary | ICD-10-CM | POA: Diagnosis not present

## 2016-04-29 DIAGNOSIS — I4891 Unspecified atrial fibrillation: Secondary | ICD-10-CM | POA: Insufficient documentation

## 2016-04-29 DIAGNOSIS — Z79899 Other long term (current) drug therapy: Secondary | ICD-10-CM | POA: Insufficient documentation

## 2016-04-29 DIAGNOSIS — I10 Essential (primary) hypertension: Secondary | ICD-10-CM | POA: Insufficient documentation

## 2016-04-29 DIAGNOSIS — F17210 Nicotine dependence, cigarettes, uncomplicated: Secondary | ICD-10-CM | POA: Insufficient documentation

## 2016-04-29 DIAGNOSIS — Z7982 Long term (current) use of aspirin: Secondary | ICD-10-CM | POA: Insufficient documentation

## 2016-04-29 DIAGNOSIS — K353 Acute appendicitis with localized peritonitis: Secondary | ICD-10-CM | POA: Diagnosis not present

## 2016-04-29 DIAGNOSIS — Z89512 Acquired absence of left leg below knee: Secondary | ICD-10-CM | POA: Diagnosis not present

## 2016-04-29 DIAGNOSIS — R109 Unspecified abdominal pain: Secondary | ICD-10-CM | POA: Diagnosis present

## 2016-04-29 DIAGNOSIS — K3580 Unspecified acute appendicitis: Secondary | ICD-10-CM

## 2016-04-29 MED ORDER — ONDANSETRON HCL 4 MG/2ML IJ SOLN
4.0000 mg | Freq: Once | INTRAMUSCULAR | Status: AC
  Administered 2016-04-29: 4 mg via INTRAVENOUS
  Filled 2016-04-29: qty 2

## 2016-04-29 MED ORDER — FENTANYL CITRATE (PF) 100 MCG/2ML IJ SOLN
50.0000 ug | Freq: Once | INTRAMUSCULAR | Status: AC
  Administered 2016-04-29: 50 ug via INTRAVENOUS
  Filled 2016-04-29: qty 2

## 2016-04-29 MED ORDER — DIATRIZOATE MEGLUMINE & SODIUM 66-10 % PO SOLN
ORAL | Status: AC
  Administered 2016-04-30: 02:00:00
  Filled 2016-04-29: qty 30

## 2016-04-29 MED ORDER — SODIUM CHLORIDE 0.9 % IV BOLUS (SEPSIS)
500.0000 mL | Freq: Once | INTRAVENOUS | Status: AC
  Administered 2016-04-29: 500 mL via INTRAVENOUS

## 2016-04-29 MED ORDER — SODIUM CHLORIDE 0.9 % IV BOLUS (SEPSIS)
1000.0000 mL | Freq: Once | INTRAVENOUS | Status: AC
  Administered 2016-04-29: 1000 mL via INTRAVENOUS

## 2016-04-29 NOTE — ED Triage Notes (Signed)
Pt c/o lower abdominal pain for about 1 hr.

## 2016-04-29 NOTE — ED Provider Notes (Signed)
[facility] DEPT Provider Note   CSN: [id number] Arrival date & time: 04/29/16  2245  By signing my name below, I, Irene Pap, attest that this documentation has been prepared under the direction and in the presence of Rolland Porter, MD. Electronically Signed: Irene Pap, ED Scribe. 04/29/16. 3:23 AM.  Time seen 23:32 PM   History   Chief Complaint Chief Complaint  Patient presents with  . Abdominal Pain   The history is provided by the patient. No language interpreter was used.   HPI Comments: Alejandro Rice is a 60 y.o. male with a hx of A-Fib, tobacco abuse, and HTN who presents to the Emergency Department complaining of gradually worsening bilateral lower abdominal pain that radiates to the left flank onset 3 hours ago around 8 pm. He reports decreased appetite that began today. Pt reports worsening pain with palpation, movement, and coughing. He reports mild relief with laying down. Pt states that he has not eaten anything today. He has tried Pepto-Bismol for his symptoms to no relief. Pt had two normal BMs today. He denies hx of similar symptoms, hx of abdominal surgeries, fever, nausea, vomiting, or diarrhea.  PCP Dr Mont Dutton  Past Medical History:  Diagnosis Date  . Atrial fibrillation with rapid ventricular response ([place]) 03/23/2016  . Hypertension     Patient Active Problem List   Diagnosis Date Noted  . Appendicitis, acute 04/30/2016  . Essential hypertension 03/24/2016  . Tobacco abuse 03/24/2016  . Hypokalemia 03/24/2016  . Hypotension 03/24/2016  . Atrial fibrillation with rapid ventricular response ([place]) 03/23/2016  . Hx pulmonary embolism 03/23/2016  . RECTAL BLEEDING 03/27/2010  . Osteoarthritis 03/27/2010  . FECAL SMEARING 03/27/2010  . ELEVATED PROSTATE SPECIFIC ANTIGEN 03/27/2010  . History of cardiovascular disorder 03/27/2010    Past Surgical History:  Procedure Laterality Date  . left bka         Home Medications    Prior to  Admission medications   Medication Sig Start Date End Date Taking? Authorizing Provider  aspirin EC 81 MG EC tablet Take 1 tablet (81 mg total) by mouth daily. 03/25/16   Rexene Alberts, MD  atenolol (TENORMIN) 25 MG tablet Take 1 tablet (25 mg total) by mouth daily. 03/25/16   Rexene Alberts, MD  Cholecalciferol (VITAMIN D) 2000 units CAPS Take 1 capsule by mouth daily.    Historical Provider, MD  diltiazem (CARDIZEM CD) 180 MG 24 hr capsule Take 1 capsule (180 mg total) by mouth daily. 03/25/16   Rexene Alberts, MD  hydrOXYzine (VISTARIL) 50 MG capsule Take 50 mg by mouth 2 (two) times daily as needed (for rest).  03/19/16   Historical Provider, MD  magnesium oxide (MAG-OX) 400 (241.3 Mg) MG tablet Take 1 tablet (400 mg total) by mouth daily. 03/25/16   Rexene Alberts, MD  montelukast (SINGULAIR) 10 MG tablet Take 10 mg by mouth daily.    Historical Provider, MD  Multiple Vitamins-Minerals (CENTRUM SILVER 50+MEN) TABS Take 1 tablet by mouth daily.    Historical Provider, MD  PROAIR HFA 108 [phone] Base) MCG/ACT inhaler Inhale 1-2 puffs into the lungs every 4 (four) hours as needed. 03/19/16   Historical Provider, MD    Family History Family History  Problem Relation Age of Onset  . Hypertension Mother     Social History Social History  Substance Use Topics  . Smoking status: Current Every Day Smoker    Packs/day: 1.00    Types: Cigarettes  . Smokeless tobacco: Never Used  .  Alcohol use No   Pt smokes 0.25-0.5 ppd.  Pt does not drink.  Pt is on disability for his left BKA.    Allergies   Review of patient's allergies indicates no known allergies.   Review of Systems Review of Systems  Constitutional: Positive for appetite change. Negative for fever.  Gastrointestinal: Positive for abdominal pain. Negative for diarrhea, nausea and vomiting.  All other systems reviewed and are negative.    Physical Exam Updated Vital Signs BP 140/84   Pulse 84   Resp (!) 28   Ht 5\' 9"  (1.753 m)    Wt 165 lb (74.8 kg)   SpO2 93%   BMI 24.37 kg/m   Physical Exam  Constitutional: He is oriented to person, place, and time. He appears well-developed and well-nourished.  Non-toxic appearance. He does not appear ill. No distress.     HENT:  Head: Normocephalic and atraumatic.  Right Ear: External ear normal.  Left Ear: External ear normal.  Nose: Nose normal. No mucosal edema or rhinorrhea.  Mouth/Throat: Oropharynx is clear and moist and mucous membranes are normal. No dental abscesses or uvula swelling.  Tongue is dry, edentulous   Eyes: Conjunctivae and EOM are normal. Pupils are equal, round, and reactive to light.  Neck: Normal range of motion and full passive range of motion without pain. Neck supple.  Cardiovascular: Normal rate, regular rhythm and normal heart sounds.  Exam reveals no gallop and no friction rub.   No murmur heard. Pulmonary/Chest: Effort normal and breath sounds normal. No respiratory distress. He has no wheezes. He has no rhonchi. He has no rales. He exhibits no tenderness and no crepitus.  Abdominal: Soft. Normal appearance and bowel sounds are normal. He exhibits no distension. There is no tenderness. There is no rebound and no guarding.    Mild suprapubic tenderness to palpation Very tender in RLQ No guarding or rebound No Rovsing's sign No pain with right knee tapping No psoasis sign    Musculoskeletal: Normal range of motion. He exhibits no edema or tenderness.  Moves all extremities well. Is s/p left BKA  Neurological: He is alert and oriented to person, place, and time. He has normal strength. No cranial nerve deficit.  Skin: Skin is warm, dry and intact. No rash noted. No erythema. No pallor.  Psychiatric: He has a normal mood and affect. His speech is normal and behavior is normal. His mood appears not anxious.  Nursing note and vitals reviewed.    ED Treatments / Results      Labs (all labs ordered are listed, but only abnormal results  are displayed) Results for orders placed or performed during the hospital encounter of 04/29/16  CBC with Differential  Result Value Ref Range   WBC 15.1 (H) 4.0 - 10.5 K/uL   RBC 4.77 4.22 - 5.81 MIL/uL   Hemoglobin 17.2 (H) 13.0 - 17.0 g/dL   HCT 49.7 39.0 - 52.0 %   MCV 104.2 (H) 78.0 - 100.0 fL   MCH 36.1 (H) 26.0 - 34.0 pg   MCHC 34.6 30.0 - 36.0 g/dL   RDW 13.8 11.5 - 15.5 %   Platelets 220 150 - 400 K/uL   Neutrophils Relative % 86 %   Neutro Abs 13.0 (H) 1.7 - 7.7 K/uL   Lymphocytes Relative 7 %   Lymphs Abs 1.1 0.7 - 4.0 K/uL   Monocytes Relative 7 %   Monocytes Absolute 1.1 (H) 0.1 - 1.0 K/uL   Eosinophils Relative 0 %  Eosinophils Absolute 0.0 0.0 - 0.7 K/uL   Basophils Relative 0 %   Basophils Absolute 0.0 0.0 - 0.1 K/uL  Basic metabolic panel  Result Value Ref Range   Sodium 138 135 - 145 mmol/L   Potassium 4.2 3.5 - 5.1 mmol/L   Chloride 99 (L) 101 - 111 mmol/L   CO2 25 22 - 32 mmol/L   Glucose, Bld 92 65 - 99 mg/dL   BUN 14 6 - 20 mg/dL   Creatinine, Ser 0.87 0.61 - 1.24 mg/dL   Calcium 9.4 8.9 - 10.3 mg/dL   GFR calc non Af Amer >60 >60 mL/min   GFR calc Af Amer >60 >60 mL/min   Anion gap 14 5 - 15   Laboratory interpretation all normal except leukocytosis    EKG  EKG Interpretation  Date/Time:  Monday April 30 2016 03:08:39 EDT Ventricular Rate:  86 PR Interval:    QRS Duration: 80 QT Interval:  364 QTC Calculation: 436 R Axis:   72 Text Interpretation:  Sinus rhythm Low voltage, precordial leads Borderline T abnormalities, anterior leads No significant change since last tracing 25 Mar 2016 Confirmed by Zaeden Lastinger  MD-I, Willmar Stockinger ([id number]) on 04/30/2016 3:23:36 AM       Radiology Ct Abdomen Pelvis W Contrast  Result Date: 04/30/2016 CLINICAL DATA:  RIGHT lower quadrant pain beginning today, assess for appendicitis. Gradually worsening bilateral lower abdominal pain radiating to LEFT flank, intermittent nausea and dizziness. History of hypertension.  EXAM: CT ABDOMEN AND PELVIS WITH CONTRAST TECHNIQUE: Multidetector CT imaging of the abdomen and pelvis was performed using the standard protocol following bolus administration of intravenous contrast. CONTRAST:  138mL ISOVUE-300 IOPAMIDOL (ISOVUE-300) INJECTION 61% COMPARISON:  None. FINDINGS: LUNG BASES:  Bronchial wall thickening and bibasilar bronchiectasis. SOLID ORGANS: Punctate splenic calcified granulomas. The liver is mildly nodular GASTROINTESTINAL TRACT: The appendix is enlarged, 11 mm with proximal appendix 5 mm appendicolith. Mild periappendiceal inflammation, however the there are few adjacent bubbles of extra luminal gas. Small hiatal hernia. The stomach, small and large bowel are normal in course and caliber without inflammatory changes. KIDNEYS/ URINARY TRACT: Kidneys are orthotopic, demonstrating symmetric enhancement. 2 mm RIGHT upper pole and punctate LEFT interpolar. No hydronephrosis or solid renal masses. The unopacified ureters are normal in course and caliber. Delayed imaging through the kidneys demonstrates symmetric prompt contrast excretion within the proximal urinary collecting system. Urinary bladder is partially distended and unremarkable. PERITONEUM/RETROPERITONEUM: Aortoiliac vessels are normal in course and caliber, moderate to severe calcific atherosclerosis. No lymphadenopathy by CT size criteria. Prostate size is mildly enlarged. Trace free fluid in the RIGHT pelvis is likely reactive. No focal fluid collection. SOFT TISSUE/OSSEOUS STRUCTURES: Non-suspicious. 14 mm intramuscular cyst about the RIGHT hip, possible bursal fluid collection. Moderate fat containing RIGHT inguinal hernia. Partially imaged LEFT femur intra medullary rod. IMPRESSION: 1. Acute appendicitis, with suspected perforation. Small amount of free fluid, no abscess. 2. Early cirrhosis, recommend correlation with liver function test. 3. Bibasilar bronchiectasis, this would be better characterized on dedicated CT  chest on a nonemergent basis. Moderate to severe calcific atherosclerosis. 4. Bilateral nonobstructing nephrolithiasis. Acute findings discussed with and reconfirmed by Dr.Ellamay Fors on 04/30/2016 at 2:32 am. Electronically Signed   By: Elon Alas M.D.   On: 04/30/2016 02:34    Procedures Procedures (including critical care time)  Medications Ordered in ED Medications  piperacillin-tazobactam (ZOSYN) IVPB 3.375 g (3.375 g Intravenous New Bag/Given 04/30/16 0254)  sodium chloride 0.9 % bolus 1,000 mL (0 mLs Intravenous Stopped  04/30/16 0100)  sodium chloride 0.9 % bolus 500 mL (0 mLs Intravenous Stopped 04/30/16 0100)  fentaNYL (SUBLIMAZE) injection 50 mcg (50 mcg Intravenous Given 04/29/16 2353)  ondansetron (ZOFRAN) injection 4 mg (4 mg Intravenous Given 04/29/16 2353)  diatrizoate meglumine-sodium (GASTROGRAFIN) 66-10 % solution (  Given 04/30/16 0151)  iopamidol (ISOVUE-300) 61 % injection 100 mL (100 mLs Intravenous Contrast Given 04/30/16 0151)     Initial Impression / Assessment and Plan / ED Course   11:42 PM-Discussed treatment plan which includes IV fluids, fentanyl,  Zofran and CT scan with pt at bedside and pt agreed to plan.   I have reviewed the triage vital signs and the nursing notes.  Pertinent labs & imaging results that were available during my care of the patient were reviewed by me and considered in my medical decision making (see chart for details).  Clinical Course   2:31 AM radiologist called the results of his CT scan, there is concern because of extraluminal gas. Pt was started on zosyn IV.   2:43 AM discussed with Dr. Rosana Hoes, he has asked that medicine admit patient due to history of atrial fib and possible cardiac artery disease.  2:50 AM Patient was given his test results. He states he had asymptomatic atrial fibrillation. He denies being on a blood thinner, only aspirin 81 mg daily.  2:59 AM Dr. Rosana Hoes called back, he has reviewed his chart, states to admit  the patient to his service, med surgical bed and he will do surgery later today.    Final Clinical Impressions(s) / ED Diagnoses   Final diagnoses:  Acute appendicitis, unspecified acute appendicitis type    Plan admission  Rolland Porter, MD, FACEP    I personally performed the services described in this documentation, which was scribed in my presence. The recorded information has been reviewed and considered.  Rolland Porter, MD, Barbette Or, MD 04/30/16 [phone]

## 2016-04-30 ENCOUNTER — Observation Stay (HOSPITAL_COMMUNITY): Payer: Medicaid Other | Admitting: Anesthesiology

## 2016-04-30 ENCOUNTER — Encounter (HOSPITAL_COMMUNITY)
Admission: EM | Disposition: A | Discharge status: Home / Self Care | Payer: Self-pay | Source: Home / Self Care | Attending: Emergency Medicine

## 2016-04-30 ENCOUNTER — Emergency Department (HOSPITAL_COMMUNITY): Payer: Medicaid Other

## 2016-04-30 ENCOUNTER — Encounter (HOSPITAL_COMMUNITY): Payer: Self-pay | Admitting: Radiology

## 2016-04-30 DIAGNOSIS — I10 Essential (primary) hypertension: Secondary | ICD-10-CM | POA: Diagnosis not present

## 2016-04-30 DIAGNOSIS — I4891 Unspecified atrial fibrillation: Secondary | ICD-10-CM | POA: Diagnosis not present

## 2016-04-30 DIAGNOSIS — Z86711 Personal history of pulmonary embolism: Secondary | ICD-10-CM | POA: Diagnosis not present

## 2016-04-30 DIAGNOSIS — K3580 Unspecified acute appendicitis: Secondary | ICD-10-CM | POA: Diagnosis present

## 2016-04-30 DIAGNOSIS — K353 Acute appendicitis with localized peritonitis: Secondary | ICD-10-CM | POA: Diagnosis not present

## 2016-04-30 HISTORY — PX: LAPAROSCOPIC APPENDECTOMY: SHX408

## 2016-04-30 LAB — CBC WITH DIFFERENTIAL/PLATELET
BASOS PCT: 0 %
Basophils Absolute: 0 10*3/uL (ref 0.0–0.1)
EOS PCT: 0 %
Eosinophils Absolute: 0 10*3/uL (ref 0.0–0.7)
HCT: 49.7 % (ref 39.0–52.0)
Hemoglobin: 17.2 g/dL — ABNORMAL HIGH (ref 13.0–17.0)
Lymphocytes Relative: 7 %
Lymphs Abs: 1.1 10*3/uL (ref 0.7–4.0)
MCH: 36.1 pg — ABNORMAL HIGH (ref 26.0–34.0)
MCHC: 34.6 g/dL (ref 30.0–36.0)
MCV: 104.2 fL — ABNORMAL HIGH (ref 78.0–100.0)
MONO ABS: 1.1 10*3/uL — AB (ref 0.1–1.0)
Monocytes Relative: 7 %
Neutro Abs: 13 10*3/uL — ABNORMAL HIGH (ref 1.7–7.7)
Neutrophils Relative %: 86 %
PLATELETS: 220 10*3/uL (ref 150–400)
RBC: 4.77 MIL/uL (ref 4.22–5.81)
RDW: 13.8 % (ref 11.5–15.5)
WBC: 15.1 10*3/uL — ABNORMAL HIGH (ref 4.0–10.5)

## 2016-04-30 LAB — BASIC METABOLIC PANEL
ANION GAP: 14 (ref 5–15)
BUN: 14 mg/dL (ref 6–20)
CHLORIDE: 99 mmol/L — AB (ref 101–111)
CO2: 25 mmol/L (ref 22–32)
Calcium: 9.4 mg/dL (ref 8.9–10.3)
Creatinine, Ser: 0.87 mg/dL (ref 0.61–1.24)
GFR calc Af Amer: >60 mL/min (ref >60)
Glucose, Bld: 92 mg/dL (ref 65–99)
POTASSIUM: 4.2 mmol/L (ref 3.5–5.1)
SODIUM: 138 mmol/L (ref 135–145)

## 2016-04-30 LAB — MRSA PCR SCREENING: MRSA BY PCR: NEGATIVE

## 2016-04-30 SURGERY — APPENDECTOMY, LAPAROSCOPIC
Anesthesia: General | Site: Abdomen

## 2016-04-30 MED ORDER — NEOSTIGMINE METHYLSULFATE 10 MG/10ML IV SOLN
INTRAVENOUS | Status: DC | PRN
  Administered 2016-04-30: 4 mg via INTRAVENOUS

## 2016-04-30 MED ORDER — LACTATED RINGERS IV SOLN
INTRAVENOUS | Status: DC
  Administered 2016-04-30 (×2): via INTRAVENOUS

## 2016-04-30 MED ORDER — DEXAMETHASONE SODIUM PHOSPHATE 4 MG/ML IJ SOLN
4.0000 mg | Freq: Once | INTRAMUSCULAR | Status: AC
  Administered 2016-04-30: 4 mg via INTRAVENOUS

## 2016-04-30 MED ORDER — IBUPROFEN 600 MG PO TABS: 600.0000 mg | ORAL_TABLET | Freq: Four times a day (QID) | ORAL | Status: DC | PRN

## 2016-04-30 MED ORDER — GLYCOPYRROLATE 0.2 MG/ML IJ SOLN
INTRAMUSCULAR | Status: AC
  Filled 2016-04-30: qty 1

## 2016-04-30 MED ORDER — CHLORHEXIDINE GLUCONATE 4 % EX LIQD: 1.0000 "application " | Freq: Once | CUTANEOUS | Status: DC

## 2016-04-30 MED ORDER — ROCURONIUM BROMIDE 100 MG/10ML IV SOLN
INTRAVENOUS | Status: DC | PRN
  Administered 2016-04-30: 10 mg via INTRAVENOUS
  Administered 2016-04-30: 5 mg via INTRAVENOUS
  Administered 2016-04-30: 25 mg via INTRAVENOUS

## 2016-04-30 MED ORDER — SODIUM CHLORIDE 0.9 % IR SOLN
Status: DC | PRN
  Administered 2016-04-30: 500 mL

## 2016-04-30 MED ORDER — BUPIVACAINE IN DEXTROSE 0.75-8.25 % IT SOLN
INTRATHECAL | Status: AC
  Filled 2016-04-30: qty 2

## 2016-04-30 MED ORDER — KCL IN DEXTROSE-NACL 20-5-0.45 MEQ/L-%-% IV SOLN
INTRAVENOUS | Status: DC
  Administered 2016-04-30 – 2016-05-01 (×2): via INTRAVENOUS

## 2016-04-30 MED ORDER — PROPOFOL 10 MG/ML IV BOLUS
INTRAVENOUS | Status: DC | PRN
  Administered 2016-04-30: 130 mg via INTRAVENOUS

## 2016-04-30 MED ORDER — MIDAZOLAM HCL 2 MG/2ML IJ SOLN
1.0000 mg | INTRAMUSCULAR | Status: DC | PRN
  Administered 2016-04-30: 2 mg via INTRAVENOUS

## 2016-04-30 MED ORDER — GLYCOPYRROLATE 0.2 MG/ML IJ SOLN
INTRAMUSCULAR | Status: DC | PRN
  Administered 2016-04-30: 0.6 mg via INTRAVENOUS

## 2016-04-30 MED ORDER — LIDOCAINE HCL 1 % IJ SOLN
INTRAMUSCULAR | Status: DC | PRN
  Administered 2016-04-30: 20 mL via INTRAMUSCULAR

## 2016-04-30 MED ORDER — OXYCODONE HCL 5 MG PO TABS
5.0000 mg | ORAL_TABLET | ORAL | Status: DC | PRN
  Administered 2016-04-30 – 2016-05-01 (×3): 10 mg via ORAL
  Filled 2016-04-30 (×3): qty 2

## 2016-04-30 MED ORDER — FENTANYL CITRATE (PF) 250 MCG/5ML IJ SOLN
INTRAMUSCULAR | Status: AC
  Filled 2016-04-30: qty 5

## 2016-04-30 MED ORDER — EPHEDRINE SULFATE 50 MG/ML IJ SOLN
INTRAMUSCULAR | Status: DC | PRN
  Administered 2016-04-30 (×2): 10 mg via INTRAVENOUS

## 2016-04-30 MED ORDER — ONDANSETRON HCL 4 MG/2ML IJ SOLN
INTRAMUSCULAR | Status: AC
  Filled 2016-04-30: qty 2

## 2016-04-30 MED ORDER — FENTANYL CITRATE (PF) 100 MCG/2ML IJ SOLN
INTRAMUSCULAR | Status: AC
  Filled 2016-04-30: qty 2

## 2016-04-30 MED ORDER — PIPERACILLIN-TAZOBACTAM 3.375 G IVPB 30 MIN
3.3750 g | Freq: Once | INTRAVENOUS | Status: AC
  Administered 2016-04-30: 3.375 g via INTRAVENOUS
  Filled 2016-04-30: qty 50

## 2016-04-30 MED ORDER — ONDANSETRON HCL 4 MG/2ML IJ SOLN
4.0000 mg | Freq: Three times a day (TID) | INTRAMUSCULAR | Status: AC | PRN
  Filled 2016-04-30: qty 2

## 2016-04-30 MED ORDER — DEXTROSE 5 % IV SOLN
INTRAVENOUS | Status: AC
  Filled 2016-04-30: qty 10

## 2016-04-30 MED ORDER — LIDOCAINE HCL (CARDIAC) 10 MG/ML IV SOLN
INTRAVENOUS | Status: DC | PRN
  Administered 2016-04-30: 50 mg via INTRAVENOUS

## 2016-04-30 MED ORDER — IBUPROFEN 600 MG PO TABS
600.0000 mg | ORAL_TABLET | ORAL | Status: DC | PRN
  Administered 2016-04-30: 600 mg via ORAL
  Filled 2016-04-30 (×2): qty 1

## 2016-04-30 MED ORDER — FENTANYL CITRATE (PF) 100 MCG/2ML IJ SOLN
INTRAMUSCULAR | Status: DC | PRN
  Administered 2016-04-30 (×3): 50 ug via INTRAVENOUS
  Administered 2016-04-30: 25 ug via INTRAVENOUS
  Administered 2016-04-30: 50 ug via INTRAVENOUS
  Administered 2016-04-30 (×2): 25 ug via INTRAVENOUS
  Administered 2016-04-30: 50 ug via INTRAVENOUS
  Administered 2016-04-30: 25 ug via INTRAVENOUS

## 2016-04-30 MED ORDER — LIDOCAINE HCL (PF) 1 % IJ SOLN
INTRAMUSCULAR | Status: AC
  Filled 2016-04-30: qty 30

## 2016-04-30 MED ORDER — ATROPINE SULFATE 0.4 MG/ML IJ SOLN
INTRAMUSCULAR | Status: AC
  Filled 2016-04-30: qty 1

## 2016-04-30 MED ORDER — SODIUM CHLORIDE 0.9 % IJ SOLN
INTRAMUSCULAR | Status: AC
Start: 2016-04-30 — End: 2016-04-30
  Filled 2016-04-30: qty 10

## 2016-04-30 MED ORDER — DEXTROSE 5 % IV SOLN
1.0000 g | Freq: Once | INTRAVENOUS | Status: AC
  Administered 2016-04-30: 1 g via INTRAVENOUS

## 2016-04-30 MED ORDER — CHLORHEXIDINE GLUCONATE CLOTH 2 % EX PADS: 6.0000 | MEDICATED_PAD | Freq: Once | CUTANEOUS | Status: DC

## 2016-04-30 MED ORDER — IOPAMIDOL (ISOVUE-300) INJECTION 61%
100.0000 mL | Freq: Once | INTRAVENOUS | Status: AC | PRN
  Administered 2016-04-30: 100 mL via INTRAVENOUS

## 2016-04-30 MED ORDER — SODIUM CHLORIDE 0.9 % IV SOLN
INTRAVENOUS | Status: DC
  Administered 2016-04-30: 04:00:00 via INTRAVENOUS

## 2016-04-30 MED ORDER — FENTANYL CITRATE (PF) 100 MCG/2ML IJ SOLN
50.0000 ug | INTRAMUSCULAR | Status: DC | PRN
  Administered 2016-04-30: 50 ug via INTRAVENOUS
  Filled 2016-04-30: qty 2

## 2016-04-30 MED ORDER — ONDANSETRON HCL 4 MG/2ML IJ SOLN
4.0000 mg | Freq: Once | INTRAMUSCULAR | Status: AC
  Administered 2016-04-30: 4 mg via INTRAVENOUS

## 2016-04-30 MED ORDER — MIDAZOLAM HCL 2 MG/2ML IJ SOLN
INTRAMUSCULAR | Status: AC
  Filled 2016-04-30: qty 2

## 2016-04-30 MED ORDER — BUPIVACAINE HCL (PF) 0.5 % IJ SOLN
INTRAMUSCULAR | Status: AC
  Filled 2016-04-30: qty 30

## 2016-04-30 MED ORDER — DEXTROSE 5 % IV SOLN
2.0000 g | INTRAVENOUS | Status: DC
  Administered 2016-04-30: 2 g via INTRAVENOUS
  Filled 2016-04-30 (×3): qty 2

## 2016-04-30 MED ORDER — METRONIDAZOLE IN NACL 5-0.79 MG/ML-% IV SOLN
500.0000 mg | Freq: Three times a day (TID) | INTRAVENOUS | Status: DC
  Administered 2016-04-30 – 2016-05-01 (×3): 500 mg via INTRAVENOUS
  Filled 2016-04-30 (×3): qty 100

## 2016-04-30 MED ORDER — DEXAMETHASONE SODIUM PHOSPHATE 4 MG/ML IJ SOLN
INTRAMUSCULAR | Status: AC
  Filled 2016-04-30: qty 1

## 2016-04-30 SURGICAL SUPPLY — 46 items
BAG HAMPER (MISCELLANEOUS) ×3 IMPLANT
CHLORAPREP W/TINT 26ML (MISCELLANEOUS) ×3 IMPLANT
CLOTH BEACON ORANGE TIMEOUT ST (SAFETY) ×3 IMPLANT
COVER LIGHT HANDLE STERIS (MISCELLANEOUS) ×6 IMPLANT
CUTTER FLEX LINEAR 45M (STAPLE) ×3 IMPLANT
CUTTER LINEAR ENDO 35 ART FLEX (STAPLE) IMPLANT
DECANTER SPIKE VIAL GLASS SM (MISCELLANEOUS) ×6 IMPLANT
DERMABOND ADVANCED (GAUZE/BANDAGES/DRESSINGS) ×2
DERMABOND ADVANCED .7 DNX12 (GAUZE/BANDAGES/DRESSINGS) ×1 IMPLANT
DEVICE TROCAR PUNCTURE CLOSURE (ENDOMECHANICALS) ×3 IMPLANT
ELECT REM PT RETURN 9FT ADLT (ELECTROSURGICAL) ×3
ELECTRODE REM PT RTRN 9FT ADLT (ELECTROSURGICAL) ×1 IMPLANT
EVACUATOR SMOKE 8.L (FILTER) ×3 IMPLANT
FORMALIN 10 PREFIL 120ML (MISCELLANEOUS) ×3 IMPLANT
GLOVE BIOGEL PI IND STRL 7.0 (GLOVE) ×1 IMPLANT
GLOVE BIOGEL PI IND STRL 7.5 (GLOVE) ×1 IMPLANT
GLOVE BIOGEL PI INDICATOR 7.0 (GLOVE) ×2
GLOVE BIOGEL PI INDICATOR 7.5 (GLOVE) ×2
GLOVE ECLIPSE 7.0 STRL STRAW (GLOVE) ×3 IMPLANT
GOWN STRL REUS W/ TWL XL LVL3 (GOWN DISPOSABLE) ×1 IMPLANT
GOWN STRL REUS W/TWL LRG LVL3 (GOWN DISPOSABLE) ×3 IMPLANT
GOWN STRL REUS W/TWL XL LVL3 (GOWN DISPOSABLE) ×2
INST SET LAPROSCOPIC AP (KITS) ×3 IMPLANT
IV NS IRRIG 3000ML ARTHROMATIC (IV SOLUTION) IMPLANT
KIT ROOM TURNOVER APOR (KITS) ×3 IMPLANT
MANIFOLD NEPTUNE II (INSTRUMENTS) ×3 IMPLANT
NEEDLE INSUFFLATION 14GA 120MM (NEEDLE) ×3 IMPLANT
NS IRRIG 1000ML POUR BTL (IV SOLUTION) ×3 IMPLANT
PACK LAP CHOLE LZT030E (CUSTOM PROCEDURE TRAY) ×3 IMPLANT
PAD ARMBOARD 7.5X6 YLW CONV (MISCELLANEOUS) ×3 IMPLANT
POUCH SPECIMEN RETRIEVAL 10MM (ENDOMECHANICALS) ×3 IMPLANT
RELOAD 45 VASCULAR/THIN (ENDOMECHANICALS) IMPLANT
RELOAD STAPLE TA45 3.5 REG BLU (ENDOMECHANICALS) ×3 IMPLANT
SET BASIN LINEN APH (SET/KITS/TRAYS/PACK) ×3 IMPLANT
SET TUBE IRRIG SUCTION NO TIP (IRRIGATION / IRRIGATOR) IMPLANT
SHEARS HARMONIC ACE PLUS 36CM (ENDOMECHANICALS) ×3 IMPLANT
SLEEVE ENDOPATH XCEL 5M (ENDOMECHANICALS) ×3 IMPLANT
SUT VIC AB 4-0 PS2 27 (SUTURE) ×3 IMPLANT
SUT VICRYL 0 UR6 27IN ABS (SUTURE) ×3 IMPLANT
SUT VICRYL AB 3-0 FS1 BRD 27IN (SUTURE) ×3 IMPLANT
TRAY FOLEY CATH SILVER 16FR (SET/KITS/TRAYS/PACK) ×3 IMPLANT
TROCAR ENDO BLADELESS 12MM (ENDOMECHANICALS) ×3 IMPLANT
TROCAR XCEL NON-BLD 5MMX100MML (ENDOMECHANICALS) ×3 IMPLANT
TUBING INSUFFLATION (TUBING) ×3 IMPLANT
WARMER LAPAROSCOPE (MISCELLANEOUS) ×3 IMPLANT
YANKAUER SUCT 12FT TUBE ARGYLE (SUCTIONS) ×3 IMPLANT

## 2016-04-30 NOTE — Transfer of Care (Signed)
Immediate Anesthesia Transfer of Care Note  Patient: Alejandro Rice  Procedure(s) Performed: Procedure(s): APPENDECTOMY LAPAROSCOPIC (N/A)  Patient Location: PACU  Anesthesia Type:General  Level of Consciousness: awake and patient cooperative  Airway & Oxygen Therapy: Patient Spontanous Breathing and non-rebreather face mask  Post-op Assessment: Report given to RN and Patient moving all extremities  Post vital signs: stable  Last Vitals:  Vitals:   04/30/16 0900 04/30/16 0905  BP: 106/73   Pulse:    Resp: (!) 33 14  Temp:      Last Pain:  Vitals:   04/30/16 0810  TempSrc: Oral  PainSc:       Patients Stated Pain Goal: 2 ([phone])  Complications: No apparent anesthesia complications

## 2016-04-30 NOTE — Anesthesia Procedure Notes (Signed)
Procedure Name: Intubation Date/Time: 04/30/2016 9:18 AM Performed by: Vista Deck Pre-anesthesia Checklist: Patient identified, Patient being monitored, Timeout performed, Emergency Drugs available and Suction available Patient Re-evaluated:Patient Re-evaluated prior to inductionOxygen Delivery Method: Circle System Utilized Preoxygenation: Pre-oxygenation with 100% oxygen Intubation Type: IV induction, Rapid sequence and Cricoid Pressure applied Ventilation: Mask ventilation without difficulty Laryngoscope Size: Mac and 3 Grade View: Grade I Tube type: Oral Tube size: 7.0 mm Number of attempts: 1 Airway Equipment and Method: Stylet Placement Confirmation: ETT inserted through vocal cords under direct vision,  positive ETCO2 and breath sounds checked- equal and bilateral Secured at: 21 cm Tube secured with: Tape Dental Injury: Teeth and Oropharynx as per pre-operative assessment

## 2016-04-30 NOTE — Op Note (Signed)
SURGICAL OPERATIVE REPORT  DATE OF PROCEDURE: 04/30/2016  ATTENDING Surgeon(s): Vickie Epley, MD  ANESTHESIA: GETA (General)  PRE-OPERATIVE DIAGNOSIS: Acute perforated appendicitis with localized peritonitis (K35.3)  POST-OPERATIVE DIAGNOSIS: Acute perforated appendicitis with localized peritonitis (K35.3)  PROCEDURE(S):  1.) Laparoscopic appendectomy (cpt: [id number]) with abscess drainage for perforated appendicitis  INTRAOPERATIVE FINDINGS: Inflamed appendix with moderate inflammation and pus-draining perforation at the appendiceal base and overt pus collection adjacent to the appendix in addition to murky ascites  INTRAVENOUS FLUIDS: 1000 mL crystalloid   ESTIMATED BLOOD LOSS: Minimal (<20 mL)  URINE OUTPUT: 200 mL   SPECIMENS: Appendix  IMPLANTS: None  DRAINS: None  COMPLICATIONS: None apparent  CONDITION AT END OF PROCEDURE: Hemodynamically stable and extubated  DISPOSITION OF PATIENT: PACU  INDICATIONS FOR PROCEDURE:  Patient is a 60 y.o.  male who presented with < 24 hours lower abdominal pain that started ~9 pm last night, not associated with eating, that was reduced by laying flat without moving and became worse and focused in his RLQ > suprapubic region when he attempted to stand and walk to the bathroom. CT demonstrated acute appendicitis with extraluminal air, suggesting perforation. Patient denied any N/V, change in bowel habits, fever/chills, CP, or SOB and reported his pain was well-controlled in the ED. All risks, benefits, and alternatives to above procedure were discussed with the patient, all of patient's questions were answered to his expressed satisfaction, and informed consent was obtained and documented.  DETAILS OF PROCEDURE: Patient was brought to the operating suite and appropriately identified. General anesthesia was administered along with confirmation of appropriate pre-operative antibiotics, and endotracheal intubation was performed by  anesthetist, along with NG/OG tube for gastric decompression. In supine position, operative site was prepped and draped in usual sterile fashion, and following a brief time out, initial 5 mm incision was made in a natural skin crease just below the umbilicus. Fascia was then elevated, and a Verress needle was inserted and its proper position confirmed using saline meniscus test prior to abdominal insufflation.  Upon insufflation of the abdominal cavity with carbon dioxide to a well-tolerated pressure of 12-15 mmHg, a 5 mm peri-umbilical port followed by laparoscope were inserted and used to inspect the abdominal cavity and its contents with no injuries from insertion of the first trochar noted. Two additional trocars were inserted, a 12 mm port at the Left lower quadrant position and another 5 mm port at the suprapubic position. The table was then placed in Trendelenburg position with the Right side up, and blunt graspers were gently used to retract the bowel overlying a clearly inflamed appendix surrounded by murky ascites and overt foul-smelling pus with moderate appendiceal inflammation at the appendiceal base with a pus-draining perforation ~0.5 - 1 cm from the appendiceal-cecal junction. The appendix was gently retracted by near its tip, and the base of the appendix and mesoappendix were identified in relation to the cecum. The mesoappendix was dissected from the visceral appendix and hemostasis was achieved using a harmonic scalpel. Upon freeing the visceral appendix from the mesoappendix, an endostapler loaded with a 45 mm standard tissue load was advanced across the base of the visceral appendix, which was compressed for several seconds, and the stapler was deployed and removed from the abdominal cavity. Hemostasis was confirmed, and the specimen was extracted from the abdominal cavity in a laparoscopic specimen bag. The abscess cavity was then suctioned, irrigated, and suctioned again until clear.  The  intraperitoneal cavity was inspected with no additional findings. Endoclose laparoscopic fascial  closure device was then used to re-approximate fascia at the 12 mm Left lower quadrant port site. All ports were then removed under direct visualization, and the abdominal cavity was desuflated. All port sites were irrigated/cleaned, additional local anesthetic was injected at each incision, 3-0 Vicryl was used to re-approximate dermis at 12 mm port site(s), and subcuticular 4-0 Vicryl suture was used to re-approximate skin. Skin was then cleaned, dried, and sterile skin glue was applied. Patient was then safely able to be awakened, extubated, and transferred to PACU for post-operative monitoring and care.   I was present for all aspects of procedure, and there were no intra-operative complications apparent.
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
## 2016-04-30 NOTE — Progress Notes (Signed)
Pt resting quite in bed. Pain at 3/10 to abdominal area. Received prn Motrin. Surgical punctures clean without drainage or signs of infection. Will continue to monitor.

## 2016-04-30 NOTE — Anesthesia Postprocedure Evaluation (Signed)
Anesthesia Post Note  Patient: Alejandro Rice  Procedure(s) Performed: Procedure(s) (LRB): APPENDECTOMY LAPAROSCOPIC (N/A)  Patient location during evaluation: PACU Anesthesia Type: General Level of consciousness: awake and alert Pain management: pain level controlled Vital Signs Assessment: post-procedure vital signs reviewed and stable Respiratory status: spontaneous breathing and non-rebreather facemask Cardiovascular status: stable Anesthetic complications: no    Last Vitals:  Vitals:   04/30/16 0900 04/30/16 0905  BP: 106/73   Pulse:    Resp: (!) 33 14  Temp:      Last Pain:  Vitals:   04/30/16 0810  TempSrc: Oral  PainSc:                  Drucie Opitz

## 2016-04-30 NOTE — Discharge Instructions (Signed)
In addition to included general post-operative instructions for Laparoscopic Appendectomy,  Diet: Resume home heart healthy diet.   Activity: No heavy lifting (children, pets, laundry) or strenuous activity until follow-up, but light activity and walking are encouraged. Do not drive or drink alcohol if taking narcotic pain medications.   Wound care: May shower/get incision wet with soapy water and pat dry (do not rub incisions), but no baths or submerging incision underwater until follow-up.   Medications: Resume all home medications. For mild to moderate pain: acetaminophen Ibuprofen (Motrin, Advil) or naproxen (Alleve, Naprosyn). Minimize use of acetaminophen (Tylenol) with cirrhosis (seen on CT). Narcotic pain medications, if prescribed, can be used for severe pain, though may cause nausea, constipation, and drowsiness. If you do not need the narcotic pain medication, you do not need to fill the prescription.  Call office [phone]) at any time if any questions, worsening pain, fevers/chills, bleeding, drainage from incision site, or other concerns.

## 2016-04-30 NOTE — Anesthesia Preprocedure Evaluation (Signed)
Anesthesia Evaluation  Patient identified by MRN, date of birth, ID band Patient awake    Reviewed: Allergy & Precautions, NPO status , Patient's Chart, lab work & pertinent test results  Airway Mallampati: II  TM Distance: >3 FB     Dental  (+) Edentulous Upper, Edentulous Lower   Pulmonary Current Smoker, PE   breath sounds clear to auscultation       Cardiovascular hypertension, Pt. on home beta blockers and Pt. on medications + dysrhythmias (now rate controlled, no anticoagulation) Atrial Fibrillation  Rhythm:Regular Rate:Normal     Neuro/Psych    GI/Hepatic negative GI ROS,   Endo/Other    Renal/GU      Musculoskeletal   Abdominal   Peds  Hematology   Anesthesia Other Findings raspy smoker's voice  Reproductive/Obstetrics                             Anesthesia Physical Anesthesia Plan  ASA: III  Anesthesia Plan: General   Post-op Pain Management:    Induction: Intravenous, Rapid sequence and Cricoid pressure planned  Airway Management Planned: Oral ETT  Additional Equipment:   Intra-op Plan:   Post-operative Plan: Extubation in OR  Informed Consent: I have reviewed the patients History and Physical, chart, labs and discussed the procedure including the risks, benefits and alternatives for the proposed anesthesia with the patient or authorized representative who has indicated his/her understanding and acceptance.     Plan Discussed with:   Anesthesia Plan Comments:         Anesthesia Quick Evaluation

## 2016-05-01 MED ORDER — OXYCODONE HCL 5 MG PO TABS: 5.0000 mg | ORAL_TABLET | ORAL | 0 refills | Status: DC | PRN

## 2016-05-01 NOTE — Progress Notes (Signed)
Pt's IV catheter removed and intact. Pt's IV site clean dry and intact. Discharge instructions including medications and follow up appointments were reviewed and discussed with patient. All questions were answered and no further questions at this time. Pt verbalized understanding of discharge instructions including medications and follow up appointments. Pt in stable condition and in no acute distress at time of discharge. Pt will be escorted by nurse tech.  

## 2016-05-01 NOTE — Discharge Summary (Signed)
Physician Discharge Summary  Patient ID: Alejandro Rice MRN: [id number] DOB/AGE: [birth year] 60 y.o.  Admit date: 04/29/2016 Discharge date: 05/01/2016  Admission Diagnoses:  Discharge Diagnoses:  Active Problems:   Appendicitis, acute   Discharged Condition: good  Hospital Course: 60 year old Male presented with acute onset of abdominal pain, was found on CT imaging to have perforated acute appendicitis. Patient was admitted to observation and underwent uneventful laparoscopic appendectomy. Patient's pain has been well-controlled, diet has been well-tolerated, and discharge planning was initiated. POD #1, patient appears to be safe for discharge home with appropriate instructions and follow-up.  Consults: None  Significant Diagnostic Studies: radiology: CT scan: acute perforated appendicitis  Treatments: surgery: laparoscopic appendectomy  Discharge Exam: Blood pressure 109/76, pulse 80, temperature 98 F (36.7 C), temperature source Oral, resp. rate 18, height 5\' 9"  (1.753 m), weight 74.8 kg (165 lb), SpO2 94 %. General appearance: alert, cooperative and no distress GI: soft, non-tender; bowel sounds normal; no masses,  no organomegaly, incisions well-approximated without erythema or drainage  Disposition: 01-Home or Self Care     Medication List    TAKE these medications   aspirin 81 MG EC tablet Take 1 tablet (81 mg total) by mouth daily.   atenolol 25 MG tablet Commonly known as:  TENORMIN Take 1 tablet (25 mg total) by mouth daily.   CENTRUM SILVER 50+MEN Tabs Take 1 tablet by mouth daily.   diltiazem 180 MG 24 hr capsule Commonly known as:  CARDIZEM CD Take 1 capsule (180 mg total) by mouth daily.   hydrOXYzine 50 MG capsule Commonly known as:  VISTARIL Take 50 mg by mouth 2 (two) times daily as needed (for rest).   magnesium oxide 400 (241.3 Mg) MG tablet Commonly known as:  MAG-OX Take 1 tablet (400 mg total) by mouth daily.   montelukast 10 MG  tablet Commonly known as:  SINGULAIR Take 10 mg by mouth daily.   oxyCODONE 5 MG immediate release tablet Commonly known as:  Oxy IR/ROXICODONE Take 1-2 tablets (5-10 mg total) by mouth every 4 (four) hours as needed for moderate pain.   PROAIR HFA 108 (90 Base) MCG/ACT inhaler Generic drug:  albuterol Inhale 1-2 puffs into the lungs every 4 (four) hours as needed.   Vitamin D 2000 units Caps Take 1 capsule by mouth daily.      Follow-up Information    Vickie Epley, MD. Schedule an appointment as soon as possible for a visit in 2 week(s).   Specialty:  General Surgery Contact information: [address] [facility] [address] [phone]           Signed: Vickie Epley 05/01/2016, 9:11 AM

## 2016-05-03 ENCOUNTER — Encounter (HOSPITAL_COMMUNITY): Payer: Self-pay | Admitting: Surgery

## 2016-06-06 ENCOUNTER — Encounter: Payer: Self-pay | Admitting: Cardiology

## 2016-06-13 ENCOUNTER — Encounter: Payer: Self-pay | Admitting: Cardiovascular Disease

## 2016-06-14 ENCOUNTER — Encounter: Payer: Self-pay | Admitting: Cardiovascular Disease

## 2016-06-14 ENCOUNTER — Ambulatory Visit (INDEPENDENT_AMBULATORY_CARE_PROVIDER_SITE_OTHER): Payer: Commercial Managed Care - HMO | Admitting: Cardiovascular Disease

## 2016-06-14 VITALS — BP 122/60 | HR 59 | Ht 69.0 in | Wt 157.0 lb

## 2016-06-14 DIAGNOSIS — I480 Paroxysmal atrial fibrillation: Secondary | ICD-10-CM | POA: Diagnosis not present

## 2016-06-14 NOTE — Patient Instructions (Signed)
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
## 2016-10-02 ENCOUNTER — Observation Stay (HOSPITAL_COMMUNITY)
Admission: EM | Admit: 2016-10-02 | Discharge: 2016-10-04 | Disposition: A | Discharge status: Home / Self Care | Payer: Medicare HMO | Attending: Family Medicine | Admitting: Family Medicine

## 2016-10-02 ENCOUNTER — Emergency Department (HOSPITAL_COMMUNITY): Payer: Medicare HMO

## 2016-10-02 ENCOUNTER — Encounter (HOSPITAL_COMMUNITY): Payer: Self-pay

## 2016-10-02 DIAGNOSIS — I480 Paroxysmal atrial fibrillation: Secondary | ICD-10-CM | POA: Diagnosis not present

## 2016-10-02 DIAGNOSIS — Z89512 Acquired absence of left leg below knee: Secondary | ICD-10-CM | POA: Diagnosis not present

## 2016-10-02 DIAGNOSIS — D72829 Elevated white blood cell count, unspecified: Secondary | ICD-10-CM | POA: Insufficient documentation

## 2016-10-02 DIAGNOSIS — F17210 Nicotine dependence, cigarettes, uncomplicated: Secondary | ICD-10-CM | POA: Insufficient documentation

## 2016-10-02 DIAGNOSIS — J441 Chronic obstructive pulmonary disease with (acute) exacerbation: Secondary | ICD-10-CM | POA: Diagnosis not present

## 2016-10-02 DIAGNOSIS — I10 Essential (primary) hypertension: Secondary | ICD-10-CM | POA: Diagnosis not present

## 2016-10-02 DIAGNOSIS — R05 Cough: Secondary | ICD-10-CM | POA: Diagnosis not present

## 2016-10-02 DIAGNOSIS — J069 Acute upper respiratory infection, unspecified: Secondary | ICD-10-CM | POA: Diagnosis not present

## 2016-10-02 DIAGNOSIS — Z7982 Long term (current) use of aspirin: Secondary | ICD-10-CM | POA: Diagnosis not present

## 2016-10-02 DIAGNOSIS — R059 Cough, unspecified: Secondary | ICD-10-CM | POA: Diagnosis present

## 2016-10-02 DIAGNOSIS — Z8679 Personal history of other diseases of the circulatory system: Secondary | ICD-10-CM

## 2016-10-02 DIAGNOSIS — I4891 Unspecified atrial fibrillation: Secondary | ICD-10-CM

## 2016-10-02 DIAGNOSIS — Z8249 Family history of ischemic heart disease and other diseases of the circulatory system: Secondary | ICD-10-CM | POA: Insufficient documentation

## 2016-10-02 LAB — CBC WITH DIFFERENTIAL/PLATELET
Basophils Absolute: 0 10*3/uL (ref 0.0–0.1)
Basophils Relative: 0 %
EOS ABS: 0 10*3/uL (ref 0.0–0.7)
EOS PCT: 0 %
HCT: 51.9 % (ref 39.0–52.0)
Hemoglobin: 18.3 g/dL — ABNORMAL HIGH (ref 13.0–17.0)
LYMPHS ABS: 2 10*3/uL (ref 0.7–4.0)
Lymphocytes Relative: 10 %
MCH: 36 pg — AB (ref 26.0–34.0)
MCHC: 35.3 g/dL (ref 30.0–36.0)
MCV: 102 fL — ABNORMAL HIGH (ref 78.0–100.0)
MONO ABS: 2.2 10*3/uL — AB (ref 0.1–1.0)
MONOS PCT: 11 %
Neutro Abs: 16.6 10*3/uL — ABNORMAL HIGH (ref 1.7–7.7)
Neutrophils Relative %: 79 %
PLATELETS: 227 10*3/uL (ref 150–400)
RBC: 5.09 MIL/uL (ref 4.22–5.81)
RDW: 13.3 % (ref 11.5–15.5)
WBC: 20.9 10*3/uL — AB (ref 4.0–10.5)

## 2016-10-02 LAB — COMPREHENSIVE METABOLIC PANEL
ALT: 26 U/L (ref 17–63)
ANION GAP: 11 (ref 5–15)
AST: 27 U/L (ref 15–41)
Albumin: 3.8 g/dL (ref 3.5–5.0)
Alkaline Phosphatase: 76 U/L (ref 38–126)
BUN: 21 mg/dL — ABNORMAL HIGH (ref 6–20)
CALCIUM: 9.6 mg/dL (ref 8.9–10.3)
CHLORIDE: 96 mmol/L — AB (ref 101–111)
CO2: 26 mmol/L (ref 22–32)
Creatinine, Ser: 1.15 mg/dL (ref 0.61–1.24)
Glucose, Bld: 115 mg/dL — ABNORMAL HIGH (ref 65–99)
Potassium: 4.5 mmol/L (ref 3.5–5.1)
SODIUM: 133 mmol/L — AB (ref 135–145)
Total Bilirubin: 1.2 mg/dL (ref 0.3–1.2)
Total Protein: 8.4 g/dL — ABNORMAL HIGH (ref 6.5–8.1)

## 2016-10-02 LAB — TROPONIN I: Troponin I: <0.03 ng/mL (ref <0.03)

## 2016-10-02 MED ORDER — LEVOFLOXACIN 500 MG PO TABS
500.0000 mg | ORAL_TABLET | Freq: Once | ORAL | Status: AC
  Administered 2016-10-02: 500 mg via ORAL
  Filled 2016-10-02: qty 1

## 2016-10-02 MED ORDER — DILTIAZEM HCL 100 MG IV SOLR
5.0000 mg/h | Freq: Once | INTRAVENOUS | Status: AC
  Administered 2016-10-02: 5 mg/h via INTRAVENOUS
  Filled 2016-10-02: qty 100

## 2016-10-02 MED ORDER — DILTIAZEM HCL 25 MG/5ML IV SOLN
10.0000 mg | Freq: Once | INTRAVENOUS | Status: AC
  Administered 2016-10-02: 10 mg via INTRAVENOUS
  Filled 2016-10-02: qty 5

## 2016-10-02 MED ORDER — DILTIAZEM HCL 25 MG/5ML IV SOLN
10.0000 mg | Freq: Once | INTRAVENOUS | Status: AC
  Administered 2016-10-02: 10 mg via INTRAVENOUS

## 2016-10-02 MED ORDER — ALBUTEROL SULFATE (2.5 MG/3ML) 0.083% IN NEBU: 5.0000 mg | INHALATION_SOLUTION | Freq: Once | RESPIRATORY_TRACT | Status: DC

## 2016-10-02 MED ORDER — SODIUM CHLORIDE 0.9 % IV BOLUS (SEPSIS)
500.0000 mL | Freq: Once | INTRAVENOUS | Status: AC
  Administered 2016-10-02: 500 mL via INTRAVENOUS

## 2016-10-02 NOTE — H&P (Signed)
History and Physical    Alejandro Rice [id number] DOB: 28-Apr-1956 DOA: 10/02/2016  PCP: Rogers Blocker, MD  Patient coming from:  home  Chief Complaint:   cough  HPI: Alejandro Rice is a 61 y.o. male with medical history significant of  Afib, htn comes to ED tonight for 4 days of coughing and not feeling well.  He denies sob or chest pain.  Denies any fevers.  His cough has been productive.  Denies any nasal congestion or flu contacts.  Pt found to be in afib with rvr and referred for admission for rate control.   Review of Systems: As per HPI otherwise 10 point review of systems negative.   Past Medical History:  Diagnosis Date  . Atrial fibrillation with rapid ventricular response ([place]) 03/23/2016  . Hypertension     Past Surgical History:  Procedure Laterality Date  . LAPAROSCOPIC APPENDECTOMY N/A 04/30/2016   Procedure: APPENDECTOMY LAPAROSCOPIC;  Surgeon: Vickie Epley, MD;  Location: [facility];  Service: General;  Laterality: N/A;  . left bka       reports that he has been smoking Cigarettes.  He started smoking about 45 years ago. He has been smoking about 0.50 packs per day. He has never used smokeless tobacco. He reports that he does not drink alcohol or use drugs.  No Known Allergies  Family History  Problem Relation Age of Onset  . Hypertension Mother     Prior to Admission medications   Medication Sig Start Date End Date Taking? Authorizing Provider  aspirin EC 81 MG EC tablet Take 1 tablet (81 mg total) by mouth daily. 03/25/16  Yes Rexene Alberts, MD  Aspirin-Acetaminophen-Caffeine (GOODY HEADACHE PO) Take 1 packet by mouth daily as needed (for pain).   Yes Historical Provider, MD  atenolol (TENORMIN) 25 MG tablet Take 1 tablet (25 mg total) by mouth daily. 03/25/16  Yes Rexene Alberts, MD  Cholecalciferol (VITAMIN D) 2000 units CAPS Take 1 capsule by mouth daily.   Yes Historical Provider, MD  diltiazem (CARDIZEM CD) 180 MG 24 hr capsule Take 1 capsule (180 mg  total) by mouth daily. 03/25/16  Yes Rexene Alberts, MD  hydrOXYzine (VISTARIL) 50 MG capsule Take 50-100 mg by mouth at bedtime as needed (for rest).  03/19/16  Yes Historical Provider, MD  Magnesium 500 MG CAPS Take 1 capsule by mouth daily.   Yes Historical Provider, MD  montelukast (SINGULAIR) 10 MG tablet Take 10 mg by mouth daily.   Yes Historical Provider, MD  Multiple Vitamins-Minerals (CENTRUM SILVER 50+MEN) TABS Take 1 tablet by mouth daily.   Yes Historical Provider, MD  PROAIR HFA 108 [phone] Base) MCG/ACT inhaler Inhale 1-2 puffs into the lungs every 4 (four) hours as needed for wheezing or shortness of breath.  03/19/16  Yes Historical Provider, MD  oxyCODONE (OXY IR/ROXICODONE) 5 MG immediate release tablet Take 1-2 tablets (5-10 mg total) by mouth every 4 (four) hours as needed for moderate pain. Patient not taking: Reported on 10/02/2016 05/01/16   Vickie Epley, MD    Physical Exam: Vitals:   10/02/16 1930 10/02/16 2000 10/02/16 2030 10/02/16 2045  BP: 101/78 116/95 117/71   Pulse: 114 (!) 41 (!) 131 95  Resp: 19 25 16 21   Temp:      TempSrc:      SpO2: 93% 93% 94% 95%  Weight:      Height:        Constitutional: NAD, calm, comfortable Vitals:   10/02/16  1930 10/02/16 2000 10/02/16 2030 10/02/16 2045  BP: 101/78 116/95 117/71   Pulse: 114 (!) 41 (!) 131 95  Resp: 19 25 16 21   Temp:      TempSrc:      SpO2: 93% 93% 94% 95%  Weight:      Height:       Eyes: PERRL, lids and conjunctivae normal ENMT: Mucous membranes are moist. Posterior pharynx clear of any exudate or lesions.Normal dentition.  Neck: normal, supple, no masses, no thyromegaly Respiratory: clear to auscultation bilaterally, no wheezing, no crackles. Normal respiratory effort. No accessory muscle use.  Cardiovascular: Regular rate and rhythm, no murmurs / rubs / gallops. No extremity edema. 2+ pedal pulses. No carotid bruits.  Abdomen: no tenderness, no masses palpated. No hepatosplenomegaly. Bowel sounds  positive.  Musculoskeletal: no clubbing / cyanosis. No joint deformity upper and lower extremities. Good ROM, no contractures. Normal muscle tone.  Skin: no rashes, lesions, ulcers. No induration Neurologic: CN 2-12 grossly intact. Sensation intact, DTR normal. Strength 5/5 in all 4.  Psychiatric: Normal judgment and insight. Alert and oriented x 3. Normal mood.    Labs on Admission: I have personally reviewed following labs and imaging studies  CBC:  Recent Labs Lab 10/02/16 1702  WBC 20.9*  NEUTROABS 16.6*  HGB 18.3*  HCT 51.9  MCV 102.0*  PLT [id number]   Basic Metabolic Panel:  Recent Labs Lab 10/02/16 1702  NA 133*  K 4.5  CL 96*  CO2 26  GLUCOSE 115*  BUN 21*  CREATININE 1.15  CALCIUM 9.6   GFR: Estimated Creatinine Clearance: 65.7 mL/min (by C-G formula based on SCr of 1.15 mg/dL). Liver Function Tests:  Recent Labs Lab 10/02/16 1702  AST 27  ALT 26  ALKPHOS 76  BILITOT 1.2  PROT 8.4*  ALBUMIN 3.8    Cardiac Enzymes:  Recent Labs Lab 10/02/16 1702  TROPONINI <0.03   Radiological Exams on Admission: Dg Chest 2 View  Result Date: 10/02/2016 CLINICAL DATA:  Onset of coughing Sunday, coughing up white mucus which has become green, shortness of breath, chest tightness, hypertension, atrial fibrillation, smoker EXAM: CHEST  2 VIEW COMPARISON:  03/23/2016 FINDINGS: Normal heart size, mediastinal contours, and pulmonary vascularity. Atherosclerotic calcification aorta. Emphysematous and mild bronchitic changes compatible with COPD. Scarring at RIGHT base unchanged. No acute infiltrate, pleural effusion or pneumothorax. Probable LEFT nipple shadow, unchanged Multiple old LEFT rib fractures and nonunion of prior LEFT clavicular fracture. Bones demineralized. Fisher IMPRESSION: COPD changes with RIGHT basilar scarring. No acute abnormalities. Electronically Signed   By: Lavonia Dana M.D.   On: 10/02/2016 17:17    EKG: Independently reviewed. afib with rvr cxr  reviwed no edema or infiltrate  Assessment/Plan 61 yo male with likely CAP and afib with rvr  Principal Problem:   Atrial fibrillation with rapid ventricular response ([place])- dilt drip wean as HR comes down.  Likely due to his coughing.  Active Problems:   History of cardiovascular disorder- noted   Cough- treat empirically for CAP with levaquin   Hypertension- noted     DVT prophylaxis: scds Code Status:  full Family Communication: none  Disposition Plan:  Per day team Consults called:  none Admission status:  observation   Alanta Scobey A MD [facility]  If 7PM-7AM, please contact night-coverage [email or web address] Password [facility]  10/02/2016, 9:41 PM

## 2016-10-02 NOTE — ED Triage Notes (Signed)
Pt reports that he began coughing Sunday and was coughing up white mucous, now green mucous. Reports sob. Chest is tight from coughing

## 2016-10-02 NOTE — ED Notes (Signed)
No Cardizem in pyxis. AC made aware and bringing dose down from pharmacy.

## 2016-10-02 NOTE — ED Provider Notes (Signed)
[facility] DEPT Provider Note   CSN: [id number] Arrival date & time: 10/02/16  1627     History   Chief Complaint Chief Complaint  Patient presents with  . Cough    HPI CORDAY VALEK is a 61 y.o. male.  Patient complains of cough for a few days and some weakness   The history is provided by the patient. No language interpreter was used.  Cough  This is a new problem. The current episode started more than 2 days ago. The problem occurs constantly. The cough is productive of sputum. There has been no fever. Pertinent negatives include no chest pain and no headaches. He has tried nothing for the symptoms.    Past Medical History:  Diagnosis Date  . Atrial fibrillation with rapid ventricular response ([place]) 03/23/2016  . Hypertension     Patient Active Problem List   Diagnosis Date Noted  . Atrial fibrillation ([place]) 10/02/2016  . Appendicitis, acute 04/30/2016  . Essential hypertension 03/24/2016  . Tobacco abuse 03/24/2016  . Hypokalemia 03/24/2016  . Hypotension 03/24/2016  . Atrial fibrillation with rapid ventricular response ([place]) 03/23/2016  . Hx pulmonary embolism 03/23/2016  . RECTAL BLEEDING 03/27/2010  . Osteoarthritis 03/27/2010  . FECAL SMEARING 03/27/2010  . ELEVATED PROSTATE SPECIFIC ANTIGEN 03/27/2010  . History of cardiovascular disorder 03/27/2010    Past Surgical History:  Procedure Laterality Date  . LAPAROSCOPIC APPENDECTOMY N/A 04/30/2016   Procedure: APPENDECTOMY LAPAROSCOPIC;  Surgeon: Vickie Epley, MD;  Location: [facility];  Service: General;  Laterality: N/A;  . left bka         Home Medications    Prior to Admission medications   Medication Sig Start Date End Date Taking? Authorizing Provider  aspirin EC 81 MG EC tablet Take 1 tablet (81 mg total) by mouth daily. 03/25/16  Yes Rexene Alberts, MD  Aspirin-Acetaminophen-Caffeine (GOODY HEADACHE PO) Take 1 packet by mouth daily as needed (for pain).   Yes Historical Provider, MD    atenolol (TENORMIN) 25 MG tablet Take 1 tablet (25 mg total) by mouth daily. 03/25/16  Yes Rexene Alberts, MD  Cholecalciferol (VITAMIN D) 2000 units CAPS Take 1 capsule by mouth daily.   Yes Historical Provider, MD  diltiazem (CARDIZEM CD) 180 MG 24 hr capsule Take 1 capsule (180 mg total) by mouth daily. 03/25/16  Yes Rexene Alberts, MD  hydrOXYzine (VISTARIL) 50 MG capsule Take 50-100 mg by mouth at bedtime as needed (for rest).  03/19/16  Yes Historical Provider, MD  Magnesium 500 MG CAPS Take 1 capsule by mouth daily.   Yes Historical Provider, MD  montelukast (SINGULAIR) 10 MG tablet Take 10 mg by mouth daily.   Yes Historical Provider, MD  Multiple Vitamins-Minerals (CENTRUM SILVER 50+MEN) TABS Take 1 tablet by mouth daily.   Yes Historical Provider, MD  PROAIR HFA 108 [phone] Base) MCG/ACT inhaler Inhale 1-2 puffs into the lungs every 4 (four) hours as needed for wheezing or shortness of breath.  03/19/16  Yes Historical Provider, MD  oxyCODONE (OXY IR/ROXICODONE) 5 MG immediate release tablet Take 1-2 tablets (5-10 mg total) by mouth every 4 (four) hours as needed for moderate pain. Patient not taking: Reported on 10/02/2016 05/01/16   Vickie Epley, MD    Family History Family History  Problem Relation Age of Onset  . Hypertension Mother     Social History Social History  Substance Use Topics  . Smoking status: Current Every Day Smoker    Packs/day: 0.50  Types: Cigarettes    Start date: 06/15/1971  . Smokeless tobacco: Never Used  . Alcohol use No     Allergies   Patient has no known allergies.   Review of Systems Review of Systems  Constitutional: Negative for appetite change and fatigue.  HENT: Negative for congestion, ear discharge and sinus pressure.   Eyes: Negative for discharge.  Respiratory: Positive for cough.   Cardiovascular: Negative for chest pain.  Gastrointestinal: Negative for abdominal pain and diarrhea.  Genitourinary: Negative for frequency and  hematuria.  Musculoskeletal: Negative for back pain.  Skin: Negative for rash.  Neurological: Negative for seizures and headaches.  Psychiatric/Behavioral: Negative for hallucinations.     Physical Exam Updated Vital Signs BP 117/71   Pulse (!) 131   Temp 98.1 F (36.7 C) (Oral)   Resp 16   Ht 5\' 9"  (1.753 m)   Wt 150 lb (68 kg)   SpO2 94%   BMI 22.15 kg/m   Physical Exam  Constitutional: He is oriented to person, place, and time. He appears well-developed.  HENT:  Head: Normocephalic.  Eyes: Conjunctivae and EOM are normal. No scleral icterus.  Neck: Neck supple. No thyromegaly present.  Cardiovascular: Exam reveals no gallop and no friction rub.   No murmur heard. Patient has a rapid irregular heartbeat  Pulmonary/Chest: No stridor. He has no wheezes. He has no rales. He exhibits no tenderness.  Abdominal: He exhibits no distension. There is no tenderness. There is no rebound.  Musculoskeletal: Normal range of motion. He exhibits no edema.  Lymphadenopathy:    He has no cervical adenopathy.  Neurological: He is oriented to person, place, and time. He exhibits normal muscle tone. Coordination normal.  Skin: No rash noted. No erythema.  Psychiatric: He has a normal mood and affect. His behavior is normal.     ED Treatments / Results  Labs (all labs ordered are listed, but only abnormal results are displayed) Labs Reviewed  CBC WITH DIFFERENTIAL/PLATELET - Abnormal; Notable for the following:       Result Value   WBC 20.9 (*)    Hemoglobin 18.3 (*)    MCV 102.0 (*)    MCH 36.0 (*)    Neutro Abs 16.6 (*)    Monocytes Absolute 2.2 (*)    All other components within normal limits  COMPREHENSIVE METABOLIC PANEL - Abnormal; Notable for the following:    Sodium 133 (*)    Chloride 96 (*)    Glucose, Bld 115 (*)    BUN 21 (*)    Total Protein 8.4 (*)    All other components within normal limits  TROPONIN I    EKG  EKG Interpretation  Date/Time:  Tuesday  October 02 2016 16:44:49 EST Ventricular Rate:  147 PR Interval:    QRS Duration: 59 QT Interval:  280 QTC Calculation: 438 R Axis:   70 Text Interpretation:  Atrial fibrillation Anteroseptal infarct, old Repol abnrm suggests ischemia, diffuse leads Minimal ST elevation, lateral leads Confirmed by Manar Smalling  MD, Letticia Bhattacharyya [phone]) on 10/02/2016 7:16:19 PM       Radiology Dg Chest 2 View  Result Date: 10/02/2016 CLINICAL DATA:  Onset of coughing Sunday, coughing up white mucus which has become green, shortness of breath, chest tightness, hypertension, atrial fibrillation, smoker EXAM: CHEST  2 VIEW COMPARISON:  03/23/2016 FINDINGS: Normal heart size, mediastinal contours, and pulmonary vascularity. Atherosclerotic calcification aorta. Emphysematous and mild bronchitic changes compatible with COPD. Scarring at RIGHT base unchanged. No acute infiltrate, pleural  effusion or pneumothorax. Probable LEFT nipple shadow, unchanged Multiple old LEFT rib fractures and nonunion of prior LEFT clavicular fracture. Bones demineralized. Fisher IMPRESSION: COPD changes with RIGHT basilar scarring. No acute abnormalities. Electronically Signed   By: Lavonia Dana M.D.   On: 10/02/2016 17:17    Procedures Procedures (including critical care time)  Medications Ordered in ED Medications  sodium chloride 0.9 % bolus 500 mL (0 mLs Intravenous Stopped 10/02/16 1901)  diltiazem (CARDIZEM) 100 mg in dextrose 5 % 100 mL (1 mg/mL) infusion (10 mg/hr Intravenous Rate/Dose Change 10/02/16 1918)  diltiazem (CARDIZEM) injection 10 mg (10 mg Intravenous Given 10/02/16 1748)  diltiazem (CARDIZEM) injection 10 mg (10 mg Intravenous Given 10/02/16 1910)  levofloxacin (LEVAQUIN) tablet 500 mg (500 mg Oral Given 10/02/16 2038)     Initial Impression / Assessment and Plan / ED Course  I have reviewed the triage vital signs and the nursing notes.  Pertinent labs & imaging results that were available during my care of the patient were  reviewed by me and considered in my medical decision making (see chart for details). ,edcr CRITICAL CARE Performed by: Dontrel Smethers L Total critical care time:40 minutes Critical care time was exclusive of separately billable procedures and treating other patients. Critical care was necessary to treat or prevent imminent or life-threatening deterioration. Critical care was time spent personally by me on the following activities: development of treatment plan with patient and/or surrogate as well as nursing, discussions with consultants, evaluation of patient's response to treatment, examination of patient, obtaining history from patient or surrogate, ordering and performing treatments and interventions, ordering and review of laboratory studies, ordering and review of radiographic studies, pulse oximetry and re-evaluation of patient's condition. Patient with atrial fib with rapid rate. Patient's rate is controlled on IV Cardizem. He also has history of alcohol shortness of breath and yellow sputum production he will be treated for pneumonia. Patient is admitted to stepdown      Final Clinical Impressions(s) / ED Diagnoses   Final diagnoses:  Atrial fibrillation with RVR [facility])    New Prescriptions New Prescriptions   No medications on file     Milton Ferguson, MD 10/02/16 2044

## 2016-10-03 DIAGNOSIS — R079 Chest pain, unspecified: Secondary | ICD-10-CM

## 2016-10-03 DIAGNOSIS — I480 Paroxysmal atrial fibrillation: Secondary | ICD-10-CM | POA: Diagnosis not present

## 2016-10-03 DIAGNOSIS — Z8679 Personal history of other diseases of the circulatory system: Secondary | ICD-10-CM | POA: Diagnosis not present

## 2016-10-03 DIAGNOSIS — R05 Cough: Secondary | ICD-10-CM | POA: Diagnosis not present

## 2016-10-03 DIAGNOSIS — I4891 Unspecified atrial fibrillation: Secondary | ICD-10-CM | POA: Diagnosis not present

## 2016-10-03 DIAGNOSIS — J441 Chronic obstructive pulmonary disease with (acute) exacerbation: Secondary | ICD-10-CM

## 2016-10-03 DIAGNOSIS — I10 Essential (primary) hypertension: Secondary | ICD-10-CM

## 2016-10-03 DIAGNOSIS — R0609 Other forms of dyspnea: Secondary | ICD-10-CM

## 2016-10-03 LAB — COMPREHENSIVE METABOLIC PANEL
ALT: 22 U/L (ref 17–63)
AST: 23 U/L (ref 15–41)
Albumin: 3.4 g/dL — ABNORMAL LOW (ref 3.5–5.0)
Alkaline Phosphatase: 66 U/L (ref 38–126)
Anion gap: 9 (ref 5–15)
BILIRUBIN TOTAL: 1.5 mg/dL — AB (ref 0.3–1.2)
BUN: 17 mg/dL (ref 6–20)
CO2: 23 mmol/L (ref 22–32)
CREATININE: 0.88 mg/dL (ref 0.61–1.24)
Calcium: 8.8 mg/dL — ABNORMAL LOW (ref 8.9–10.3)
Chloride: 99 mmol/L — ABNORMAL LOW (ref 101–111)
GFR calc Af Amer: >60 mL/min (ref >60)
Glucose, Bld: 99 mg/dL (ref 65–99)
POTASSIUM: 4.3 mmol/L (ref 3.5–5.1)
Sodium: 131 mmol/L — ABNORMAL LOW (ref 135–145)
TOTAL PROTEIN: 7.3 g/dL (ref 6.5–8.1)

## 2016-10-03 LAB — CBC WITH DIFFERENTIAL/PLATELET
BASOS ABS: 0 10*3/uL (ref 0.0–0.1)
Basophils Relative: 0 %
Eosinophils Absolute: 0 10*3/uL (ref 0.0–0.7)
Eosinophils Relative: 0 %
HEMATOCRIT: 46.1 % (ref 39.0–52.0)
Hemoglobin: 15.9 g/dL (ref 13.0–17.0)
LYMPHS ABS: 1.8 10*3/uL (ref 0.7–4.0)
LYMPHS PCT: 11 %
MCH: 35 pg — AB (ref 26.0–34.0)
MCHC: 34.5 g/dL (ref 30.0–36.0)
MCV: 101.5 fL — AB (ref 78.0–100.0)
MONO ABS: 1.9 10*3/uL — AB (ref 0.1–1.0)
MONOS PCT: 11 %
NEUTROS ABS: 13.1 10*3/uL — AB (ref 1.7–7.7)
Neutrophils Relative %: 78 %
Platelets: 201 10*3/uL (ref 150–400)
RBC: 4.54 MIL/uL (ref 4.22–5.81)
RDW: 13.3 % (ref 11.5–15.5)
WBC: 16.8 10*3/uL — ABNORMAL HIGH (ref 4.0–10.5)

## 2016-10-03 LAB — MRSA PCR SCREENING: MRSA BY PCR: NEGATIVE

## 2016-10-03 LAB — PROCALCITONIN: PROCALCITONIN: 0.16 ng/mL

## 2016-10-03 MED ORDER — DILTIAZEM HCL ER COATED BEADS 180 MG PO CP24
180.0000 mg | ORAL_CAPSULE | Freq: Every day | ORAL | Status: DC
  Administered 2016-10-03 – 2016-10-04 (×2): 180 mg via ORAL
  Filled 2016-10-03 (×2): qty 1

## 2016-10-03 MED ORDER — IPRATROPIUM-ALBUTEROL 0.5-2.5 (3) MG/3ML IN SOLN: 3.0000 mL | RESPIRATORY_TRACT | Status: DC | PRN

## 2016-10-03 MED ORDER — SODIUM CHLORIDE 0.9% FLUSH
3.0000 mL | Freq: Two times a day (BID) | INTRAVENOUS | Status: DC
  Administered 2016-10-03 – 2016-10-04 (×4): 3 mL via INTRAVENOUS

## 2016-10-03 MED ORDER — SODIUM CHLORIDE 0.9 % IV BOLUS (SEPSIS)
1000.0000 mL | Freq: Once | INTRAVENOUS | Status: AC
  Administered 2016-10-03: 1000 mL via INTRAVENOUS

## 2016-10-03 MED ORDER — ASPIRIN EC 81 MG PO TBEC
81.0000 mg | DELAYED_RELEASE_TABLET | Freq: Every day | ORAL | Status: DC
  Administered 2016-10-03 – 2016-10-04 (×2): 81 mg via ORAL
  Filled 2016-10-03 (×2): qty 1

## 2016-10-03 MED ORDER — ALBUTEROL SULFATE (2.5 MG/3ML) 0.083% IN NEBU: 3.0000 mL | INHALATION_SOLUTION | RESPIRATORY_TRACT | Status: DC | PRN

## 2016-10-03 MED ORDER — ACETAMINOPHEN 650 MG RE SUPP: 650.0000 mg | Freq: Four times a day (QID) | RECTAL | Status: DC | PRN

## 2016-10-03 MED ORDER — IPRATROPIUM-ALBUTEROL 0.5-2.5 (3) MG/3ML IN SOLN
3.0000 mL | Freq: Four times a day (QID) | RESPIRATORY_TRACT | Status: DC
Start: 2016-10-03 — End: 2016-10-03
  Administered 2016-10-03: 3 mL via RESPIRATORY_TRACT
  Filled 2016-10-03: qty 3

## 2016-10-03 MED ORDER — GUAIFENESIN-DM 100-10 MG/5ML PO SYRP
5.0000 mL | ORAL_SOLUTION | ORAL | Status: DC | PRN
  Administered 2016-10-03 – 2016-10-04 (×2): 5 mL via ORAL
  Filled 2016-10-03 (×2): qty 5

## 2016-10-03 MED ORDER — IPRATROPIUM-ALBUTEROL 0.5-2.5 (3) MG/3ML IN SOLN: 3.0000 mL | Freq: Four times a day (QID) | RESPIRATORY_TRACT | Status: DC | PRN

## 2016-10-03 MED ORDER — LEVOFLOXACIN IN D5W 750 MG/150ML IV SOLN
750.0000 mg | INTRAVENOUS | Status: DC
  Administered 2016-10-03: 750 mg via INTRAVENOUS
  Filled 2016-10-03: qty 150

## 2016-10-03 MED ORDER — SODIUM CHLORIDE 0.9% FLUSH: 3.0000 mL | INTRAVENOUS | Status: DC | PRN

## 2016-10-03 MED ORDER — ACETAMINOPHEN 325 MG PO TABS: 650.0000 mg | ORAL_TABLET | Freq: Four times a day (QID) | ORAL | Status: DC | PRN

## 2016-10-03 MED ORDER — DILTIAZEM LOAD VIA INFUSION
10.0000 mg | Freq: Once | INTRAVENOUS | Status: AC
  Administered 2016-10-03: 10 mg via INTRAVENOUS
  Filled 2016-10-03: qty 10

## 2016-10-03 MED ORDER — SODIUM CHLORIDE 0.9 % IV SOLN: 250.0000 mL | INTRAVENOUS | Status: DC | PRN

## 2016-10-03 MED ORDER — ATENOLOL 25 MG PO TABS
25.0000 mg | ORAL_TABLET | Freq: Every day | ORAL | Status: DC
  Administered 2016-10-03 – 2016-10-04 (×2): 25 mg via ORAL
  Filled 2016-10-03 (×2): qty 1

## 2016-10-03 MED ORDER — DILTIAZEM HCL-DEXTROSE 100-5 MG/100ML-% IV SOLN (PREMIX)
5.0000 mg/h | INTRAVENOUS | Status: DC
  Administered 2016-10-03: 15 mg/h via INTRAVENOUS

## 2016-10-03 NOTE — Progress Notes (Signed)
Patient had 5.13s pause, after which one he converted to SR with HR in 70s, BP 107/70. Cardizem was turned off due to pause.  Patient is alert and oriented. Dr. Shanon Brow was paged about recent changes in the patient's HR.

## 2016-10-03 NOTE — Progress Notes (Signed)
PROGRESS NOTE    Alejandro Rice  [id number]  DOB: 1956-01-14  DOA: 10/02/2016 PCP: Rogers Blocker, MD  Hospital course: Alejandro Rice is a 61 y.o. male with medical history significant of  Afib, htn comes to ED tonight for 4 days of coughing and not feeling well.  He denies sob or chest pain.  Denies any fevers.  His cough has been productive.  Denies any nasal congestion or flu contacts.  Pt found to be in afib with rvr and referred for admission for rate control.  Assessment & Plan:   1. afib with RVR - Pt is off the IV diltiazem at this time, had to stop it because of 5 second pause, HR now controlled, resuming oral diltiazem home dose, requested cardiology consultation.  Transfer to telemetry.  2. Leukocytosis - secondary to URI, recheck this AM. Continue levofloxacin.  3. URI - continue levofloxacin.  4. Essential Hypertension - stable, controlled.  5. COPD with mild acute exacerbation - order nebs, levofloxacin.  6. Tobacco Abuser- counseled at bedside on cessation.   DVT prophylaxis: SCDs Code Status: full Disposition Plan: possibly home later today after cardiology sees   Consultants:  cardiology  Subjective: Pt still has productive cough but denies palpitations. Denies SOB and chest pain  Objective: Vitals:   10/03/16 0029 10/03/16 0045 10/03/16 0331 10/03/16 0400  BP:  110/85 107/70   Pulse:  (!) 152    Resp:  (!) 33    Temp: 99.5 F (37.5 C)   99.4 F (37.4 C)  TempSrc: Oral   Oral  SpO2:  94%    Weight: 68.9 kg (151 lb 14.4 oz)     Height: 5\' 9"  (1.753 m)       Intake/Output Summary (Last 24 hours) at 10/03/16 0717 Last data filed at 10/03/16 0252  Gross per 24 hour  Intake              265 ml  Output              200 ml  Net               65 ml   Filed Weights   10/02/16 1633 10/03/16 0029  Weight: 68 kg (150 lb) 68.9 kg (151 lb 14.4 oz)    Exam:  General exam: awake, alert, NAD, cooperative Respiratory system: tight bilateral with upper  airway congestion noise. No increased work of breathing. Cardiovascular system: S1 & S2 heard, RRR. No JVD, murmurs, gallops, clicks or pedal edema. Gastrointestinal system: Abdomen is nondistended, soft and nontender. Normal bowel sounds heard. Central nervous system: Alert and oriented. No focal neurological deficits. Extremities: no CCE.  Data Reviewed: Basic Metabolic Panel:  Recent Labs Lab 10/02/16 1702  NA 133*  K 4.5  CL 96*  CO2 26  GLUCOSE 115*  BUN 21*  CREATININE 1.15  CALCIUM 9.6   Liver Function Tests:  Recent Labs Lab 10/02/16 1702  AST 27  ALT 26  ALKPHOS 76  BILITOT 1.2  PROT 8.4*  ALBUMIN 3.8   No results for input(s): LIPASE, AMYLASE in the last 168 hours. No results for input(s): AMMONIA in the last 168 hours. CBC:  Recent Labs Lab 10/02/16 1702  WBC 20.9*  NEUTROABS 16.6*  HGB 18.3*  HCT 51.9  MCV 102.0*  PLT 227   Cardiac Enzymes:  Recent Labs Lab 10/02/16 1702  TROPONINI <0.03   CBG (last 3)  No results for input(s): GLUCAP in the last 72  hours. Recent Results (from the past 240 hour(s))  MRSA PCR Screening     Status: None   Collection Time: 10/03/16 12:26 AM  Result Value Ref Range Status   MRSA by PCR NEGATIVE NEGATIVE Final    Comment:        The GeneXpert MRSA Assay (FDA approved for NASAL specimens only), is one component of a comprehensive MRSA colonization surveillance program. It is not intended to diagnose MRSA infection nor to guide or monitor treatment for MRSA infections.      Studies: Dg Chest 2 View  Result Date: 10/02/2016 CLINICAL DATA:  Onset of coughing Sunday, coughing up white mucus which has become green, shortness of breath, chest tightness, hypertension, atrial fibrillation, smoker EXAM: CHEST  2 VIEW COMPARISON:  03/23/2016 FINDINGS: Normal heart size, mediastinal contours, and pulmonary vascularity. Atherosclerotic calcification aorta. Emphysematous and mild bronchitic changes compatible with  COPD. Scarring at RIGHT base unchanged. No acute infiltrate, pleural effusion or pneumothorax. Probable LEFT nipple shadow, unchanged Multiple old LEFT rib fractures and nonunion of prior LEFT clavicular fracture. Bones demineralized. Fisher IMPRESSION: COPD changes with RIGHT basilar scarring. No acute abnormalities. Electronically Signed   By: Lavonia Dana M.D.   On: 10/02/2016 17:17   Scheduled Meds: . aspirin EC  81 mg Oral Daily  . atenolol  25 mg Oral Daily  . diltiazem  180 mg Oral Daily  . sodium chloride flush  3 mL Intravenous Q12H   Continuous Infusions: . diltiazem (CARDIZEM) infusion Stopped (10/03/16 0313)    Principal Problem:   Atrial fibrillation with rapid ventricular response ([facility]) Active Problems:   History of cardiovascular disorder   Cough   Hypertension   Critical Care Time spent: 40 mins   Irwin Brakeman, MD, FAAFP [facility] Pager [phone]  If 7PM-7AM, please contact night-coverage [email or web address] Password [email or web address] 10/03/2016, 7:17 AM    LOS: 0 days

## 2016-10-03 NOTE — Progress Notes (Signed)
Patient's HR continues to stay in 150s., BP 117/ 84. Pt denies any pain or discomfort at this time. Dr. Shanon Brow is paged, awaiting for further orders.

## 2016-10-03 NOTE — Care Management Note (Signed)
Case Management Note  Patient Details  Name: Alejandro Rice MRN: [id number] Date of Birth: October 04, 1955  Subjective/Objective: Patient adm from home with a fib with RVR/? PNA. He lives alone is ind with ADL's, has cane, uses occasionally. Has PCP, friends/family drive him to appointments, he reports no issues affording medications.                    Action/Plan: Anticipate DC home with self care. CM following for any needs regarding new medications due to A fib.   Expected Discharge Date:           10/05/2016       Expected Discharge Plan:  Home/Self Care  In-House Referral:  NA  Discharge planning Services  CM Consult  Post Acute Care Choice:    Choice offered to:     DME Arranged:    DME Agency:     HH Arranged:    HH Agency:     Status of Service:  In process, will continue to follow  If discussed at Long Length of Stay Meetings, dates discussed:    Additional Comments:  Rebeckah Masih, Chauncey Reading, RN 10/03/2016, 2:33 PM

## 2016-10-03 NOTE — ED Notes (Signed)
Patient experienced coughing spell.  Patient's heart rate increased to 150.  Dr. Shanon Brow notified of patient's heart rate.  Instructed to transport patient to the floor.
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
## 2016-10-03 NOTE — Progress Notes (Signed)
Pharmacy Antibiotic Note  Alejandro Rice is a 61 y.o. male admitted on 10/02/2016 with pneumonia.  Pharmacy has been consulted for [facility] dosing.  Plan: Levaquin 750mg  IV q24hrs Switch to PO when improved / appropriate  Height: 5\' 9"  (175.3 cm) Weight: 151 lb 14.4 oz (68.9 kg) IBW/kg (Calculated) : 70.7  Temp (24hrs), Avg:99.4 F (37.4 C), Min:98.1 F (36.7 C), Max:100.1 F (37.8 C)   Recent Labs Lab 10/02/16 1702 10/03/16 0750  WBC 20.9* 16.8*  CREATININE 1.15 0.88    Estimated Creatinine Clearance: 87 mL/min (by C-G formula based on SCr of 0.88 mg/dL).    No Known Allergies  Antimicrobials this admission: Levaquin 1/30 >>    Dose adjustments this admission:  Microbiology results:  BCx: pending  UCx: pending   Sputum:    MRSA PCR:   Thank you for allowing pharmacy to be a part of this patient's care.  Hart Robinsons A 10/03/2016 11:00 AM

## 2016-10-03 NOTE — Care Management Obs Status (Signed)
[place] NOTIFICATION   Patient Details  Name: Alejandro Rice MRN: [id number] Date of Birth: 11/10/1955   Medicare Observation Status Notification Given:  Yes    Denelda Akerley, Chauncey Reading, RN 10/03/2016, 2:30 PM

## 2016-10-03 NOTE — Plan of Care (Signed)
Problem: Education: Goal: Knowledge of disease or condition will improve Outcome: Progressing Patient understands AFIB and what it can cause.

## 2016-10-04 DIAGNOSIS — Z8679 Personal history of other diseases of the circulatory system: Secondary | ICD-10-CM | POA: Diagnosis not present

## 2016-10-04 DIAGNOSIS — R05 Cough: Secondary | ICD-10-CM | POA: Diagnosis not present

## 2016-10-04 DIAGNOSIS — I10 Essential (primary) hypertension: Secondary | ICD-10-CM | POA: Diagnosis not present

## 2016-10-04 DIAGNOSIS — I480 Paroxysmal atrial fibrillation: Secondary | ICD-10-CM | POA: Diagnosis not present

## 2016-10-04 DIAGNOSIS — I4891 Unspecified atrial fibrillation: Secondary | ICD-10-CM | POA: Diagnosis not present

## 2016-10-04 LAB — CBC
HCT: 46.8 % (ref 39.0–52.0)
HEMOGLOBIN: 15.8 g/dL (ref 13.0–17.0)
MCH: 34.3 pg — AB (ref 26.0–34.0)
MCHC: 33.8 g/dL (ref 30.0–36.0)
MCV: 101.5 fL — ABNORMAL HIGH (ref 78.0–100.0)
PLATELETS: 185 10*3/uL (ref 150–400)
RBC: 4.61 MIL/uL (ref 4.22–5.81)
RDW: 12.5 % (ref 11.5–15.5)
WBC: 10.7 10*3/uL — AB (ref 4.0–10.5)

## 2016-10-04 LAB — COMPREHENSIVE METABOLIC PANEL
ALK PHOS: 74 U/L (ref 38–126)
ALT: 21 U/L (ref 17–63)
ANION GAP: 8 (ref 5–15)
AST: 26 U/L (ref 15–41)
Albumin: 3.3 g/dL — ABNORMAL LOW (ref 3.5–5.0)
BUN: 14 mg/dL (ref 6–20)
CO2: 25 mmol/L (ref 22–32)
CREATININE: 0.8 mg/dL (ref 0.61–1.24)
Calcium: 9 mg/dL (ref 8.9–10.3)
Chloride: 100 mmol/L — ABNORMAL LOW (ref 101–111)
Glucose, Bld: 85 mg/dL (ref 65–99)
Potassium: 3.9 mmol/L (ref 3.5–5.1)
SODIUM: 133 mmol/L — AB (ref 135–145)
TOTAL PROTEIN: 7.2 g/dL (ref 6.5–8.1)
Total Bilirubin: 1.1 mg/dL (ref 0.3–1.2)

## 2016-10-04 LAB — PROCALCITONIN: PROCALCITONIN: 0.17 ng/mL

## 2016-10-04 MED ORDER — GUAIFENESIN-DM 100-10 MG/5ML PO SYRP: 5.0000 mL | ORAL_SOLUTION | ORAL | 0 refills | Status: DC | PRN

## 2016-10-04 MED ORDER — PROAIR HFA 108 (90 BASE) MCG/ACT IN AERS: 1.0000 | INHALATION_SPRAY | RESPIRATORY_TRACT | 0 refills | Status: DC | PRN

## 2016-10-04 MED ORDER — LEVOFLOXACIN 500 MG PO TABS: 500.0000 mg | ORAL_TABLET | Freq: Every day | ORAL | 0 refills | Status: AC

## 2016-10-04 NOTE — Plan of Care (Signed)
Problem: Cardiac: Goal: Ability to achieve and maintain adequate cardiopulmonary perfusion will improve Outcome: Progressing Patient has maintained NSR for the last 24 hours.

## 2016-10-04 NOTE — Discharge Summary (Signed)
Physician Discharge Summary  Alejandro Rice [id number] DOB: Feb 12, 1956 DOA: 10/02/2016  PCP: Rogers Blocker, MD  Admit date: 10/02/2016 Discharge date: 10/04/2016  Admitted From: Home  Disposition:  Home   Recommendations for Outpatient Follow-up:  1. Follow up with PCP in 1 weeks and please follow up with cardiology in 2 weeks 2. Please help patient with smoking cessation program  Discharge Condition: STABLE  CODE STATUS: FULL  Diet recommendation: Heart Healthy   Brief/Interim Summary:  HPI: Alejandro Rice is a 61 y.o. male with medical history significant of  Afib, htn comes to ED tonight for 4 days of coughing and not feeling well.  He denies sob or chest pain.  Denies any fevers.  His cough has been productive.  Denies any nasal congestion or flu contacts.  Pt found to be in afib with rvr and referred for admission for rate control.  Hospital course: Alejandro Rice a 61 y.o.malewith medical history significant of Afib, htn comes to ED tonight for 4 days of coughing and not feeling well. He denies sob or chest pain. Denies any fevers. His cough has been productive. Denies any nasal congestion or flu contacts. Pt found to be in afib with rvr and referred for admission for rate control.  Assessment & Plan:   1. afib with RVR - Pt is off the IV diltiazem.  HR now controlled, resumed oral diltiazem home dose and oral atenolol 25 mg daily, cardiology consultation appreciated - see notes.   2. Leukocytosis - secondary to URI,improved with treatment and trending down. Continue levofloxacin 5 more doses.  3. URI - continue levofloxacin and cough syrup as needed.  4. Essential Hypertension - stable, controlled.  5. COPD with mild acute exacerbation - given nebs, levofloxacin in hospital. 6. Tobacco Abuser- counseled at bedside on cessation.   DVT prophylaxis: SCDs Code Status: full Disposition Plan: home with close follow up recommended  Discharge Diagnoses:  Principal  Problem:   Atrial fibrillation with rapid ventricular response (HCC) Active Problems:   History of cardiovascular disorder   Cough   Hypertension  Discharge Instructions  Discharge Instructions    Diet - low sodium heart healthy    Complete by:  As directed    Increase activity slowly    Complete by:  As directed      Allergies as of 10/04/2016   No Known Allergies     Medication List    STOP taking these medications   GOODY HEADACHE PO   oxyCODONE 5 MG immediate release tablet Commonly known as:  Oxy IR/ROXICODONE     TAKE these medications   aspirin 81 MG EC tablet Take 1 tablet (81 mg total) by mouth daily.   atenolol 25 MG tablet Commonly known as:  TENORMIN Take 1 tablet (25 mg total) by mouth daily.   CENTRUM SILVER 50+MEN Tabs Take 1 tablet by mouth daily.   diltiazem 180 MG 24 hr capsule Commonly known as:  CARDIZEM CD Take 1 capsule (180 mg total) by mouth daily.   guaiFENesin-dextromethorphan 100-10 MG/5ML syrup Commonly known as:  ROBITUSSIN DM Take 5 mLs by mouth every 4 (four) hours as needed for cough.   hydrOXYzine 50 MG capsule Commonly known as:  VISTARIL Take 50-100 mg by mouth at bedtime as needed (for rest).   levofloxacin 500 MG tablet Commonly known as:  LEVAQUIN Take 1 tablet (500 mg total) by mouth daily.   Magnesium 500 MG Caps Take 1 capsule by mouth daily.  montelukast 10 MG tablet Commonly known as:  SINGULAIR Take 10 mg by mouth daily.   PROAIR HFA 108 (90 Base) MCG/ACT inhaler Generic drug:  albuterol Inhale 1-2 puffs into the lungs every 4 (four) hours as needed for wheezing or shortness of breath.   Vitamin D 2000 units Caps Take 1 capsule by mouth daily.      Follow-up Information    Rogers Blocker, MD. Schedule an appointment as soon as possible for a visit in 1 week(s).   Specialty:  Internal Medicine Contact information: [place] [address] [phone]        Jenkins Rouge, MD. Schedule an  appointment as soon as possible for a visit in 2 week(s).   Specialty:  Cardiology Why:  Hospital Follow Up  Contact information: [id number] [place] [address] [phone]          No Known Allergies   Procedures/Studies: Dg Chest 2 View  Result Date: 10/02/2016 CLINICAL DATA:  Onset of coughing Sunday, coughing up white mucus which has become green, shortness of breath, chest tightness, hypertension, atrial fibrillation, smoker EXAM: CHEST  2 VIEW COMPARISON:  03/23/2016 FINDINGS: Normal heart size, mediastinal contours, and pulmonary vascularity. Atherosclerotic calcification aorta. Emphysematous and mild bronchitic changes compatible with COPD. Scarring at RIGHT base unchanged. No acute infiltrate, pleural effusion or pneumothorax. Probable LEFT nipple shadow, unchanged Multiple old LEFT rib fractures and nonunion of prior LEFT clavicular fracture. Bones demineralized. Fisher IMPRESSION: COPD changes with RIGHT basilar scarring. No acute abnormalities. Electronically Signed   By: Lavonia Dana M.D.   On: 10/02/2016 17:17     Subjective: Pt says he is feeling much better today.   Discharge Exam: Vitals:   10/04/16 0300 10/04/16 0400  BP: 106/79 109/73  Pulse: 71 74  Resp: (!) 34 (!) 34  Temp:  100 F (37.8 C)   Vitals:   10/04/16 0200 10/04/16 0300 10/04/16 0400 10/04/16 0500  BP: 115/77 106/79 109/73   Pulse: 71 71 74   Resp: (!) 30 (!) 34 (!) 34   Temp:   100 F (37.8 C)   TempSrc:   Oral   SpO2: 94% 94% 92%   Weight:    69.6 kg (153 lb 7 oz)  Height:       General: Pt is alert, awake, not in acute distress Cardiovascular: RRR, S1/S2 +, no rubs, no gallops Respiratory: CTA bilaterally, no wheezing, no rhonchi Abdominal: Soft, NT, ND, bowel sounds + Extremities: no edema, no cyanosis  The results of significant diagnostics from this hospitalization (including imaging, microbiology, ancillary and laboratory) are listed below for reference.      Microbiology: Recent Results (from the past 240 hour(s))  MRSA PCR Screening     Status: None   Collection Time: 10/03/16 12:26 AM  Result Value Ref Range Status   MRSA by PCR NEGATIVE NEGATIVE Final    Comment:        The GeneXpert MRSA Assay (FDA approved for NASAL specimens only), is one component of a comprehensive MRSA colonization surveillance program. It is not intended to diagnose MRSA infection nor to guide or monitor treatment for MRSA infections.      Labs: BNP (last 3 results) No results for input(s): BNP in the last 8760 hours. Basic Metabolic Panel:  Recent Labs Lab 10/02/16 1702 10/03/16 0750 10/04/16 0435  NA 133* 131* 133*  K 4.5 4.3 3.9  CL 96* 99* 100*  CO2 26 23 25  GLUCOSE 115* 99 85  BUN 21* 17 14  CREATININE 1.15 0.88 0.80  CALCIUM 9.6 8.8* 9.0   Liver Function Tests:  Recent Labs Lab 10/02/16 1702 10/03/16 0750 10/04/16 0435  AST 27 23 26   ALT 26 22 21   ALKPHOS 76 66 74  BILITOT 1.2 1.5* 1.1  PROT 8.4* 7.3 7.2  ALBUMIN 3.8 3.4* 3.3*   No results for input(s): LIPASE, AMYLASE in the last 168 hours. No results for input(s): AMMONIA in the last 168 hours. CBC:  Recent Labs Lab 10/02/16 1702 10/03/16 0750 10/04/16 0435  WBC 20.9* 16.8* 10.7*  NEUTROABS 16.6* 13.1*  --   HGB 18.3* 15.9 15.8  HCT 51.9 46.1 46.8  MCV 102.0* 101.5* 101.5*  PLT 227 201 185   Cardiac Enzymes:  Recent Labs Lab 10/02/16 1702  TROPONINI <0.03   BNP: Invalid input(s): POCBNP CBG: No results for input(s): GLUCAP in the last 168 hours. D-Dimer No results for input(s): DDIMER in the last 72 hours. Hgb A1c No results for input(s): HGBA1C in the last 72 hours. Lipid Profile No results for input(s): CHOL, HDL, LDLCALC, TRIG, CHOLHDL, LDLDIRECT in the last 72 hours. Thyroid function studies No results for input(s): TSH, T4TOTAL, T3FREE, THYROIDAB in the last 72 hours.  Invalid input(s): FREET3 Anemia work up No results for input(s):  VITAMINB12, FOLATE, FERRITIN, TIBC, IRON, RETICCTPCT in the last 72 hours. Urinalysis No results found for: COLORURINE, APPEARANCEUR, [place], [place], [place], [place], [place], [place], PROTEINUR, UROBILINOGEN, NITRITE, LEUKOCYTESUR Sepsis Labs Invalid input(s): PROCALCITONIN,  WBC,  LACTICIDVEN Microbiology Recent Results (from the past 240 hour(s))  MRSA PCR Screening     Status: None   Collection Time: 10/03/16 12:26 AM  Result Value Ref Range Status   MRSA by PCR NEGATIVE NEGATIVE Final    Comment:        The GeneXpert MRSA Assay (FDA approved for NASAL specimens only), is one component of a comprehensive MRSA colonization surveillance program. It is not intended to diagnose MRSA infection nor to guide or monitor treatment for MRSA infections.     Time coordinating discharge: Over 30 minutes  SIGNED:   Irwin Brakeman, MD  [facility] 10/04/2016, 7:41 AM Pager   If 7PM-7AM, please contact night-coverage [email or web address] Password [email or web address]

## 2016-11-15 ENCOUNTER — Encounter: Payer: Self-pay | Admitting: Gastroenterology

## 2016-12-07 ENCOUNTER — Encounter: Payer: Self-pay | Admitting: Gastroenterology

## 2016-12-07 ENCOUNTER — Ambulatory Visit (INDEPENDENT_AMBULATORY_CARE_PROVIDER_SITE_OTHER): Payer: Medicare HMO | Admitting: Gastroenterology

## 2016-12-07 VITALS — BP 156/91 | HR 93 | Temp 98.0°F | Ht 69.0 in | Wt 156.6 lb

## 2016-12-07 DIAGNOSIS — K7460 Unspecified cirrhosis of liver: Secondary | ICD-10-CM | POA: Diagnosis not present

## 2016-12-07 DIAGNOSIS — B182 Chronic viral hepatitis C: Secondary | ICD-10-CM | POA: Diagnosis not present
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
## 2016-12-07 NOTE — Patient Instructions (Signed)
1. Please have your labs and ultrasound done. We will contact you with results within 5-7 business days with results.  2. Please do not share razor blades, nail clippers, toothbrushes, needles.  3. Continue to avoid drugs and alcohol.
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
## 2016-12-10 ENCOUNTER — Telehealth: Payer: Self-pay

## 2016-12-10 NOTE — Progress Notes (Signed)
cc'd to pcp 

## 2016-12-10 NOTE — Telephone Encounter (Signed)
Pt called office and LMOVM that he received message about ultrasound scheduled for 12/14/16.

## 2016-12-10 NOTE — Telephone Encounter (Signed)
[place] abd w/elastorgraphy scheduled for 12/14/16 at 10:30am, pt to arrive at 10:15am. NPO after midnight. LMOVM and informed pt. Letter also mailed.

## 2016-12-12 ENCOUNTER — Other Ambulatory Visit: Payer: Self-pay | Admitting: Gastroenterology

## 2016-12-13 LAB — HEPATITIS B SURFACE ANTIGEN: HEP B S AG: NEGATIVE

## 2016-12-13 LAB — CBC WITH DIFFERENTIAL/PLATELET
BASOS PCT: 1 %
Basophils Absolute: 88 cells/uL (ref 0–200)
EOS PCT: 2 %
Eosinophils Absolute: 176 cells/uL (ref 15–500)
HCT: 49 % (ref 38.5–50.0)
Hemoglobin: 16.9 g/dL (ref 13.2–17.1)
LYMPHS ABS: 2024 {cells}/uL (ref 850–3900)
Lymphocytes Relative: 23 %
MCH: 35.7 pg — ABNORMAL HIGH (ref 27.0–33.0)
MCHC: 34.5 g/dL (ref 32.0–36.0)
MCV: 103.6 fL — ABNORMAL HIGH (ref 80.0–100.0)
MONOS PCT: 11 %
MPV: 9.8 fL (ref 7.5–12.5)
Monocytes Absolute: 968 cells/uL — ABNORMAL HIGH (ref 200–950)
NEUTROS ABS: 5544 {cells}/uL (ref 1500–7800)
Neutrophils Relative %: 63 %
Platelets: 254 10*3/uL (ref 140–400)
RBC: 4.73 MIL/uL (ref 4.20–5.80)
RDW: 14.4 % (ref 11.0–15.0)
WBC: 8.8 10*3/uL (ref 3.8–10.8)

## 2016-12-13 LAB — PROTIME-INR
INR: 1.1
PROTHROMBIN TIME: 12.1 s — AB (ref 9.0–11.5)

## 2016-12-13 LAB — COMPREHENSIVE METABOLIC PANEL
ALK PHOS: 81 U/L (ref 40–115)
ALT: 21 U/L (ref 9–46)
AST: 31 U/L (ref 10–35)
Albumin: 3.8 g/dL (ref 3.6–5.1)
BILIRUBIN TOTAL: 0.5 mg/dL (ref 0.2–1.2)
BUN: 10 mg/dL (ref 7–25)
CO2: 28 mmol/L (ref 20–31)
CREATININE: 1.01 mg/dL (ref 0.70–1.25)
Calcium: 9.7 mg/dL (ref 8.6–10.3)
Chloride: 100 mmol/L (ref 98–110)
Glucose, Bld: 99 mg/dL (ref 65–99)
Potassium: 4.4 mmol/L (ref 3.5–5.3)
SODIUM: 138 mmol/L (ref 135–146)
TOTAL PROTEIN: 7.5 g/dL (ref 6.1–8.1)

## 2016-12-13 LAB — HEPATITIS B SURFACE ANTIBODY,QUALITATIVE: Hep B S Ab: NEGATIVE

## 2016-12-13 LAB — HIV ANTIBODY (ROUTINE TESTING W REFLEX): HIV 1&2 Ab, 4th Generation: NONREACTIVE

## 2016-12-13 LAB — HEPATITIS B CORE ANTIBODY, TOTAL: Hep B Core Total Ab: NONREACTIVE

## 2016-12-13 LAB — HEPATITIS A ANTIBODY, TOTAL: Hep A Total Ab: NONREACTIVE

## 2016-12-14 ENCOUNTER — Ambulatory Visit (HOSPITAL_COMMUNITY)
Admission: RE | Admit: 2016-12-14 | Discharge: 2016-12-14 | Disposition: A | Discharge status: Home / Self Care | Payer: Medicare HMO | Source: Ambulatory Visit | Attending: Gastroenterology | Admitting: Gastroenterology

## 2016-12-14 DIAGNOSIS — K7460 Unspecified cirrhosis of liver: Secondary | ICD-10-CM | POA: Insufficient documentation

## 2016-12-14 DIAGNOSIS — B182 Chronic viral hepatitis C: Secondary | ICD-10-CM | POA: Diagnosis present

## 2016-12-14 DIAGNOSIS — K769 Liver disease, unspecified: Secondary | ICD-10-CM | POA: Insufficient documentation

## 2016-12-15 LAB — HCV RNA, QN PCR RFLX GENO, LIPA
HCV RNA, PCR, QN: 11200000 [IU]/mL — AB
HCV RNA, PCR, QN: 7.05 {Log_IU}/mL — AB

## 2016-12-15 LAB — HEPATITIS C GENOTYPE

## 2016-12-17 ENCOUNTER — Other Ambulatory Visit: Payer: Self-pay

## 2016-12-17 ENCOUNTER — Telehealth: Payer: Self-pay | Admitting: Gastroenterology

## 2016-12-17 DIAGNOSIS — K7460 Unspecified cirrhosis of liver: Secondary | ICD-10-CM

## 2016-12-17 LAB — FOLATE: Folate: 3.6 ng/mL — ABNORMAL LOW (ref >5.4)

## 2016-12-17 LAB — VITAMIN B12: Vitamin B-12: 562 pg/mL (ref 200–1100)

## 2016-12-17 NOTE — Progress Notes (Signed)
LMOM for a return call. B12 and folate added ( spoke with Taffy @ [facility]). AFP could not be added. Orders entered and released and pt can go anytime for that lab.

## 2016-12-17 NOTE — Telephone Encounter (Signed)
Pt was returning a call to DS about his results. [phone]

## 2016-12-17 NOTE — Progress Notes (Signed)
Please let patient know that he has a 2.3 cm lesion in the right liver lobe that needs further evaluation by MRI as it could not be seen adequately by ultrasound.   Please schedule MRI liver protocol (eovist) Dx: right liver lesion on u/s. Please add AFP tumor marker, B12, folate to labs. Dx: cirrhosis, macrocytosis.

## 2016-12-17 NOTE — Progress Notes (Signed)
REVIEWED-NO ADDITIONAL RECOMMENDATIONS. 

## 2016-12-17 NOTE — Progress Notes (Signed)
I called the lab and spoke to Taffy, who added the B12 and folate. AFP tumor marker could not be added.

## 2016-12-18 ENCOUNTER — Other Ambulatory Visit: Payer: Self-pay

## 2016-12-18 DIAGNOSIS — K769 Liver disease, unspecified: Secondary | ICD-10-CM

## 2016-12-18 NOTE — Progress Notes (Signed)
Pt is aware, ok to schedule MRI, and he will go to the lab for the AFP tumor marker.

## 2016-12-18 NOTE — Telephone Encounter (Signed)
See result note. Spoke to pt earlier today.

## 2016-12-20 ENCOUNTER — Telehealth: Payer: Self-pay

## 2016-12-20 NOTE — Telephone Encounter (Signed)
Alejandro Rice at pre-service center called office this morning. Pt is scheduled for MRI Liver tomorrow at [facility]. PA is for wrong imaging site. Called [employer] and corrected imaging site for [facility]. PA# stays the same. LMOVM and informed Alejandro Rice.

## 2016-12-21 ENCOUNTER — Ambulatory Visit (HOSPITAL_COMMUNITY)
Admission: RE | Admit: 2016-12-21 | Discharge: 2016-12-21 | Disposition: A | Discharge status: Home / Self Care | Payer: Medicare HMO | Source: Ambulatory Visit | Attending: Gastroenterology | Admitting: Gastroenterology

## 2016-12-21 DIAGNOSIS — K7689 Other specified diseases of liver: Secondary | ICD-10-CM | POA: Diagnosis not present

## 2016-12-21 DIAGNOSIS — K769 Liver disease, unspecified: Secondary | ICD-10-CM

## 2016-12-21 LAB — AFP TUMOR MARKER: AFP-Tumor Marker: 8.1 ng/mL — ABNORMAL HIGH (ref <6.1)

## 2016-12-21 MED ORDER — GADOXETATE DISODIUM 0.25 MMOL/ML IV SOLN
10.0000 mL | Freq: Once | INTRAVENOUS | Status: AC | PRN
  Administered 2016-12-21: 7 mL via INTRAVENOUS

## 2016-12-24 NOTE — Progress Notes (Signed)
See other result note.

## 2016-12-24 NOTE — Progress Notes (Signed)
Lots of stuff to address.  #1 HCV gentoype 1a. Await work up of liver lesion before consider hcv tx. #2 he has borderline low folate. Suggest OTC folic acid 0.8mg  daily.  #3 needs Hep A and B vaccines #4 His liver lesion was not well seen on MRI due to motion artifact. His AFP is elevated. Need "dedicated liver protocol pre and post contrast abdominal CT." per radiologist. Please make sure we order correctly.

## 2016-12-25 NOTE — Progress Notes (Signed)
LMOM to call.

## 2016-12-26 ENCOUNTER — Other Ambulatory Visit: Payer: Self-pay

## 2016-12-26 DIAGNOSIS — K769 Liver disease, unspecified: Secondary | ICD-10-CM

## 2016-12-26 NOTE — Progress Notes (Signed)
PT is aware of results and plan. I am leaving the order for the vaccines at the front desk. OK to schedule the CT.

## 2017-01-09 ENCOUNTER — Ambulatory Visit (HOSPITAL_COMMUNITY)
Admission: RE | Admit: 2017-01-09 | Discharge: 2017-01-09 | Disposition: A | Discharge status: Home / Self Care | Payer: Medicare HMO | Source: Ambulatory Visit | Attending: Gastroenterology | Admitting: Gastroenterology

## 2017-01-09 DIAGNOSIS — J439 Emphysema, unspecified: Secondary | ICD-10-CM | POA: Insufficient documentation

## 2017-01-09 DIAGNOSIS — N200 Calculus of kidney: Secondary | ICD-10-CM | POA: Diagnosis not present

## 2017-01-09 DIAGNOSIS — I700 Atherosclerosis of aorta: Secondary | ICD-10-CM | POA: Insufficient documentation

## 2017-01-09 DIAGNOSIS — K769 Liver disease, unspecified: Secondary | ICD-10-CM | POA: Diagnosis present

## 2017-01-09 DIAGNOSIS — K7460 Unspecified cirrhosis of liver: Secondary | ICD-10-CM | POA: Diagnosis not present

## 2017-01-09 MED ORDER — IOPAMIDOL (ISOVUE-370) INJECTION 76%
100.0000 mL | Freq: Once | INTRAVENOUS | Status: AC | PRN
  Administered 2017-01-09: 100 mL via INTRAVENOUS

## 2017-01-10 ENCOUNTER — Other Ambulatory Visit: Payer: Self-pay

## 2017-01-10 ENCOUNTER — Other Ambulatory Visit: Payer: Self-pay | Admitting: Gastroenterology

## 2017-01-10 DIAGNOSIS — C220 Liver cell carcinoma: Secondary | ICD-10-CM

## 2017-01-10 DIAGNOSIS — K7469 Other cirrhosis of liver: Secondary | ICD-10-CM

## 2017-01-10 DIAGNOSIS — B1710 Acute hepatitis C without hepatic coma: Secondary | ICD-10-CM

## 2017-01-10 NOTE — Progress Notes (Signed)
Spoke with Dr. Misty Stanley regarding CT findings. He states that liver lesion is in the location that can be biopsied but looks consistent with [place]. Spoke to Dr. Corrie Mckusick, radiologist with interventional radiology. He looked at prior CT in August 2017, current CT, recent MR and states that liver lesion consistent with [place]. He recommends interventional radiology referral. He will have Jocelyn Lamer call to schedule the appointment. He states they will usually reach out with surgeons to make sure patient is not a candidate for resection and if not they will proceed with IR management of hepatoma.  I also reached out to [facility] with [facility] liver care. She recommends seeing him first for liver transplant evaluation as [place] will get him exception points and best long term recurrence free survival for [facility] is with OLT.   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  LET IR KNOW THAT WE ARE SENDING FOR CONSULTATION FOR OLT (LIVER TRANSPLANT) EVALUATION FIRST.   Discussed with patient.

## 2017-01-11 NOTE — Progress Notes (Signed)
Patient reports he has received first dose of vaccines.

## 2017-01-11 NOTE — Progress Notes (Signed)
Please clarify which referral has been faxed. See bold lettering from input 01/10/17. 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 We need to advise IR that we are sending patient to [facility] liver care first for liver transplant evaluation and if not a candidate then we will initiate referral to IR for hepatoma management.

## 2017-01-14 ENCOUNTER — Other Ambulatory Visit: Payer: Self-pay

## 2017-01-14 DIAGNOSIS — C220 Liver cell carcinoma: Secondary | ICD-10-CM

## 2017-01-14 NOTE — Progress Notes (Signed)
Forwarding to G[facility] who is working on this referral.

## 2017-01-14 NOTE — Progress Notes (Signed)
I WOULD LIKE FOR YOU TO CANCEL THE INTERVENTIONAL RADIOLOGY CONSULT FOR NOW. WE ARE GOING TO WAIT UNTIL PATIENT SEES [facility]. DO WE HAVE AN APPOINTMENT DATE YET?

## 2017-01-16 ENCOUNTER — Telehealth: Payer: Self-pay

## 2017-01-16 NOTE — Telephone Encounter (Signed)
Pt has an appointment at [facility] on 02/06/17 @ 2:15 pm

## 2017-01-22 ENCOUNTER — Other Ambulatory Visit: Payer: Medicare HMO

## 2017-02-25 NOTE — Telephone Encounter (Signed)
Reviewed [facility] note.   Patient preferred ablation of [place] as opposed to liver transplantation at this time. Is will f/u with [facility] for HCV treatment once [place] is treated.  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 PLEASE MAKE APPT WITH [facility] for "?egd for esophageal variceal screen/banding AND discuss nonselective beta blocker".

## 2017-02-26 ENCOUNTER — Encounter: Payer: Self-pay | Admitting: Gastroenterology

## 2017-02-26 NOTE — Telephone Encounter (Signed)
PT is aware. Routing to [place] to make appt with Dr. Oneida Alar.

## 2017-02-26 NOTE — Telephone Encounter (Signed)
APPT MADE AND LETTER SENT  °

## 2017-02-26 NOTE — Telephone Encounter (Signed)
LMOM to call.

## 2017-02-28 ENCOUNTER — Other Ambulatory Visit: Payer: Self-pay | Admitting: Interventional Radiology

## 2017-02-28 ENCOUNTER — Ambulatory Visit
Admission: RE | Admit: 2017-02-28 | Discharge: 2017-02-28 | Disposition: A | Discharge status: Home / Self Care | Payer: Medicare HMO | Source: Ambulatory Visit | Attending: Gastroenterology | Admitting: Gastroenterology

## 2017-02-28 DIAGNOSIS — C220 Liver cell carcinoma: Secondary | ICD-10-CM

## 2017-02-28 HISTORY — PX: IR RADIOLOGIST EVAL & MGMT: IMG5224

## 2017-02-28 NOTE — Consult Note (Signed)
Chief Complaint: Patient was seen in consultation today for liver cancer at the request of [place] S  Referring Physician(s): Lewis,Leslie S  History of Present Illness: Alejandro Rice is a 61 y.o. 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 His exposure was likely during a remote history of injectable drug use. He underwent abdominal ultrasound with the last atrophy to evaluate for hepatic cirrhosis and was found to have a 2.3 cm hypoechoic lesion and hepatic segment 5. Follow-up MRI imaging in April 2018 confirmed a 2.4 cm mass, however the patient had difficulty holding his breath during the examination and the study was severely limited. Subsequently, a intrahepatic protocol CT scan was performed in May 2018 demonstrating a 2 cm arterially enhancing lesion with rapid washouts and hepatic segment 5. These imaging characteristics are consistent with hepatocellular carcinoma in the setting of hepatic cirrhosis.  The patient was subsequently referred to [place] with [facility] for evaluation for transplant candidacy and discussion of hepatitis C treatment. He reports that he is not currently interested in undergoing a transplant. He does not drive and would have difficulty with transportation to and from [place]. He prefers to consider locoregional therapy followed by medical treatment for his hepatitis C.  Alejandro Rice is doing well today and in his usual state of health. He denies abdominal pain, nausea, vomiting, hematemesis or hematochezia. On the review of systems, he notes only intermittent fatigue. Today he is feeling well.  Past Medical History:  Diagnosis Date  . Atrial fibrillation with rapid ventricular response ([place]) 03/23/2016  . Cirrhosis ([place])    on ct  . Hepatitis C   . Hypertension     Past Surgical History:  Procedure Laterality Date  . COLONOSCOPY  2011   Dr. Oneida Alar: hemorrhoids, simple adenomas,  diverticulosis. next tcs 2021.   [name] LAPAROSCOPIC APPENDECTOMY N/A 04/30/2016   Procedure: APPENDECTOMY LAPAROSCOPIC;  Surgeon: Vickie Epley, MD;  Location: [facility];  Service: General;  Laterality: N/A;  . left bka  1989   9 operations in 59 days. then opted on bka    Allergies: Patient has no known allergies.  Medications: Prior to Admission medications   Medication Sig Start Date End Date Taking? Authorizing Provider  aspirin EC 81 MG EC tablet Take 1 tablet (81 mg total) by mouth daily. 03/25/16  Yes Rexene Alberts, MD  atenolol (TENORMIN) 25 MG tablet Take 1 tablet (25 mg total) by mouth daily. 03/25/16  Yes Rexene Alberts, MD  Cholecalciferol (VITAMIN D) 2000 units CAPS Take 1 capsule by mouth daily.   Yes [provider]  diltiazem (CARDIZEM CD) 180 MG 24 hr capsule Take 1 capsule (180 mg total) by mouth daily. 03/25/16  Yes Rexene Alberts, MD  folic acid (FOLVITE) 1 MG tablet Take 1 mg by mouth daily.   Yes [provider]  hydrOXYzine (VISTARIL) 50 MG capsule Take 50-100 mg by mouth at bedtime as needed (for rest).  03/19/16  Yes [provider]  Magnesium 500 MG CAPS Take 1 capsule by mouth daily.   Yes [provider]  montelukast (SINGULAIR) 10 MG tablet Take 10 mg by mouth daily.   Yes [provider]  Multiple Vitamins-Minerals (CENTRUM SILVER 50+MEN) TABS Take 1 tablet by mouth daily.   Yes [provider]  oxycodone (OXY-IR) 5 MG capsule Take 5 mg by mouth every 4 (four) hours as needed.   Yes [provider]  [facility] HFA  108 (90 Base) MCG/ACT inhaler Inhale 1-2 puffs into the lungs every 4 (four) hours as needed for wheezing or shortness of breath. 10/04/16  Yes Murlean Iba, MD     Family History  Problem Relation Age of Onset  . Hypertension Mother   . Colon cancer Neg Hx   . Liver disease Neg Hx     Social History   Social History  . Marital status: Single    Spouse name: N/A  . Number of children:  N/A  . Years of education: N/A   Social History Main Topics  . Smoking status: Current Every Day Smoker    Packs/day: 0.50    Types: Cigarettes    Start date: 06/15/1971  . Smokeless tobacco: Never Used  . Alcohol use No     Comment: quit 2016  . Drug use: No     Comment: remote iv/intranasal drug use  . Sexual activity: Not Asked   Other Topics Concern  . None   Social History Narrative  . None    ECOG Status: 0 - Asymptomatic  Review of Systems: A 12 point ROS discussed and pertinent positives are indicated in the HPI above.  All other systems are negative.  Review of Systems  Vital Signs: BP 119/73 (BP Location: Right Arm, Patient Position: Sitting, Cuff Size: Normal)   Pulse (!) 50   Temp 97.7 F (36.5 C)   Resp 18   Ht 5\' 9"  (1.753 m)   Wt 157 lb (71.2 kg)   SpO2 95%   BMI 23.18 kg/m   Physical Exam  Constitutional: He is oriented to person, place, and time. He appears well-developed and well-nourished. No distress.  HENT:  Head: Normocephalic and atraumatic.  Eyes: Left eye exhibits no discharge. No scleral icterus.  Cardiovascular: Normal rate and regular rhythm.   Pulmonary/Chest: Effort normal.  Abdominal: He exhibits no distension and no mass.  Neurological: He is alert and oriented to person, place, and time.  Skin: Skin is warm and dry.  Psychiatric: He has a normal mood and affect. His behavior is normal.  Nursing note and vitals reviewed.   Imaging: No results found.  Labs:  CBC:  Recent Labs  10/02/16 1702 10/03/16 0750 10/04/16 0435 12/12/16 1359  WBC 20.9* 16.8* 10.7* 8.8  HGB 18.3* 15.9 15.8 16.9  HCT 51.9 46.1 46.8 49.0  PLT 227 201 185 254    COAGS:  Recent Labs  12/12/16 1359  INR 1.1    BMP:  Recent Labs  04/30/16 0026 10/02/16 1702 10/03/16 0750 10/04/16 0435 12/12/16 1359  NA 138 133* 131* 133* 138  K 4.2 4.5 4.3 3.9 4.4  CL 99* 96* 99* 100* 100  CO2 25 26 23 25 28   GLUCOSE 92 115* 99 85 99  BUN 14  21* 17 14 10   CALCIUM 9.4 9.6 8.8* 9.0 9.7  CREATININE 0.87 1.15 0.88 0.80 1.01  GFRNONAA >60 >60 >60 >60  --   GFRAA >60 >60 >60 >60  --     LIVER FUNCTION TESTS:  Recent Labs  10/02/16 1702 10/03/16 0750 10/04/16 0435 12/12/16 1359  BILITOT 1.2 1.5* 1.1 0.5  AST 27 23 26 31   ALT 26 22 21 21   ALKPHOS 76 66 74 81  PROT 8.4* 7.3 7.2 7.5  ALBUMIN 3.8 3.4* 3.3* 3.8    TUMOR MARKERS:  Recent Labs  12/20/16 1442  AFPTM 8.1*    Assessment and Plan:  61 year old male with 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 He has been evaluated by [facility] and had a discussion on the possibility of transplantation. He is currently not interested in transportation. Given his cirrhosis and evidence of mild portal hypertension, he is not a good candidate for surgical resection.  He is an excellent candidate for local regional therapy. Given that the lesion size is less than 3 cm, percutaneous thermal ablation with microwave ablation is a potentially curable therapeutic option. Following successful treatment of his hepatocellular cancer, he would then be an excellent candidate for treatment of his underlying hepatitis C.  I explained the risks, benefits and alternatives to percutaneous thermal ablation in detail to Alejandro Rice. The primary risk for his particular case is thermal injury to the gallbladder which could result in bile leak, biloma and abscess formation. We would take the appropriate precautionary measures to minimize this risk.  He understands and desires to proceed as soon as possible.  1.) Schedule for percutaneous thermal ablation at [facility] for the next available slot.  Thank you for this interesting consult.  I greatly enjoyed meeting Alejandro Rice and look forward to participating in their care.  A copy of this report was sent to the requesting provider on this  date.  Electronically Signed: Jacqulynn Cadet 02/28/2017, 3:53 PM   I spent a total of  40 Minutes  in face to face in clinical consultation, greater than 50% of which was counseling/coordinating care for hepatocellular cancer.

## 2017-03-07 ENCOUNTER — Other Ambulatory Visit: Payer: Self-pay | Admitting: Radiology

## 2017-03-07 NOTE — Progress Notes (Signed)
PATIENT BOOKED TO DR [facility] FOR 03-15-17 SURGERY CONSENT SAYS DR [facility]. I LEFT MESSAGE ON PA IR LINE.

## 2017-03-07 NOTE — Patient Instructions (Addendum)
GIDEON BURSTEIN  03/07/2017   Your procedure is scheduled on: Friday 03-15-17  Report to [facility] Main  Entrance     FOLLOW SIGNS TO  [facility] at 915  AM   Call this number if you have problems the morning of surgery [phone]    Remember: ONLY 1 PERSON MAY GO WITH YOU TO RADIOLOGY TO GET  READY MORNING OF YOUR SURGERY.  Do not eat food or drink liquids :After Midnight.     Take these medicines the morning of surgery with A SIP OF WATER: ATENOLOL (TENORMIN), CARTIA (CARDIZEM CD), MONTELUKEST,   PROAIR INHALER IF NEEDED AND BRING INHALER WITH YOU,                                You may not have any metal on your body including hair pins and              piercings  Do not wear jewelry, make-up, lotions, powders or perfumes, deodorant             Do not wear nail polish.  Do not shave  48 hours prior to surgery.              Men may shave face and neck.   Do not bring valuables to the hospital. [place].  Contacts, dentures or bridgework may not be worn into surgery.  Leave suitcase in the car. After surgery it may be brought to your room.                  Please read over the following fact sheets you were given: _____________________________________________________________________             [facility] - Preparing for Surgery Before surgery, you can play an important role.  Because skin is not sterile, your skin needs to be as free of germs as possible.  You can reduce the number of germs on your skin by washing with CHG (chlorahexidine gluconate) soap before surgery.  CHG is an antiseptic cleaner which kills germs and bonds with the skin to continue killing germs even after washing. Please DO NOT use if you have an allergy to CHG or antibacterial soaps.  If your skin becomes reddened/irritated stop using the CHG and inform your nurse when you arrive at Short Stay. Do not shave (including  legs and underarms) for at least 48 hours prior to the first CHG shower.  You may shave your face/neck. Please follow these instructions carefully:  1.  Shower with CHG Soap the night before surgery and the  morning of Surgery.  2.  If you choose to wash your hair, wash your hair first as usual with your  normal  shampoo.  3.  After you shampoo, rinse your hair and body thoroughly to remove the  shampoo.                           4.  Use CHG as you would any other liquid soap.  You can apply chg directly  to the skin and wash  Gently with a scrungie or clean washcloth.  5.  Apply the CHG Soap to your body ONLY FROM THE NECK DOWN.   Do not use on face/ open                           Wound or open sores. Avoid contact with eyes, ears mouth and genitals (private parts).                       Wash face,  Genitals (private parts) with your normal soap.             6.  Wash thoroughly, paying special attention to the area where your surgery  will be performed.  7.  Thoroughly rinse your body with warm water from the neck down.  8.  DO NOT shower/wash with your normal soap after using and rinsing off  the CHG Soap.                9.  Pat yourself dry with a clean towel.            10.  Wear clean pajamas.            11.  Place clean sheets on your bed the night of your first shower and do not  sleep with pets. Day of Surgery : Do not apply any lotions/deodorants the morning of surgery.  Please wear clean clothes to the hospital/surgery center.  FAILURE TO FOLLOW THESE INSTRUCTIONS MAY RESULT IN THE CANCELLATION OF YOUR SURGERY PATIENT SIGNATURE_________________________________  NURSE SIGNATURE__________________________________  ________________________________________________________________________  WHAT IS A BLOOD TRANSFUSION? Blood Transfusion Information  A transfusion is the replacement of blood or some of its parts. Blood is made up of multiple cells which provide  different functions.  Red blood cells carry oxygen and are used for blood loss replacement.  White blood cells fight against infection.  Platelets control bleeding.  Plasma helps clot blood.  Other blood products are available for specialized needs, such as hemophilia or other clotting disorders. BEFORE THE TRANSFUSION  Who gives blood for transfusions?   Healthy volunteers who are fully evaluated to make sure their blood is safe. This is blood bank blood. Transfusion therapy is the safest it has ever been in the practice of medicine. Before blood is taken from a donor, a complete history is taken to make sure that person has no history of diseases nor engages in risky social behavior (examples are intravenous drug use or sexual activity with multiple partners). The donor's travel history is screened to minimize risk of transmitting infections, such as malaria. The donated blood is tested for signs of infectious diseases, such as HIV and hepatitis. The blood is then tested to be sure it is compatible with you in order to minimize the chance of a transfusion reaction. If you or a relative donates blood, this is often done in anticipation of surgery and is not appropriate for emergency situations. It takes many days to process the donated blood. RISKS AND COMPLICATIONS Although transfusion therapy is very safe and saves many lives, the main dangers of transfusion include:   Getting an infectious disease.  Developing a transfusion reaction. This is an allergic reaction to something in the blood you were given. Every precaution is taken to prevent this. The decision to have a blood transfusion has been considered carefully by your caregiver before blood is given. Blood is not given unless the benefits outweigh  the risks. AFTER THE TRANSFUSION  Right after receiving a blood transfusion, you will usually feel much better and more energetic. This is especially true if your red blood cells have  gotten low (anemic). The transfusion raises the level of the red blood cells which carry oxygen, and this usually causes an energy increase.  The nurse administering the transfusion will monitor you carefully for complications. HOME CARE INSTRUCTIONS  No special instructions are needed after a transfusion. You may find your energy is better. Speak with your caregiver about any limitations on activity for underlying diseases you may have. SEEK MEDICAL CARE IF:   Your condition is not improving after your transfusion.  You develop redness or irritation at the intravenous (IV) site. SEEK IMMEDIATE MEDICAL CARE IF:  Any of the following symptoms occur over the next 12 hours:  Shaking chills.  You have a temperature by mouth above 102 F (38.9 C), not controlled by medicine.  Chest, back, or muscle pain.  People around you feel you are not acting correctly or are confused.  Shortness of breath or difficulty breathing.  Dizziness and fainting.  You get a rash or develop hives.  You have a decrease in urine output.  Your urine turns a dark color or changes to pink, red, or brown. Any of the following symptoms occur over the next 10 days:  You have a temperature by mouth above 102 F (38.9 C), not controlled by medicine.  Shortness of breath.  Weakness after normal activity.  The white part of the eye turns yellow (jaundice).  You have a decrease in the amount of urine or are urinating less often.  Your urine turns a dark color or changes to pink, red, or brown. Document Released: 08/17/2000 Document Revised: 11/12/2011 Document Reviewed: 04/05/2008 [facility] Patient Information 2014 [place], [place].  _______________________________________________________________________

## 2017-03-08 ENCOUNTER — Encounter (HOSPITAL_COMMUNITY): Payer: Self-pay

## 2017-03-08 ENCOUNTER — Ambulatory Visit (HOSPITAL_COMMUNITY)
Admission: RE | Admit: 2017-03-08 | Discharge: 2017-03-08 | Disposition: A | Discharge status: Home / Self Care | Payer: Medicare HMO | Source: Ambulatory Visit | Attending: Radiology | Admitting: Radiology

## 2017-03-08 ENCOUNTER — Encounter (HOSPITAL_COMMUNITY)
Admission: RE | Admit: 2017-03-08 | Discharge: 2017-03-08 | Disposition: A | Discharge status: Home / Self Care | Payer: Medicare HMO | Source: Ambulatory Visit | Attending: Interventional Radiology | Admitting: Interventional Radiology

## 2017-03-08 DIAGNOSIS — C649 Malignant neoplasm of unspecified kidney, except renal pelvis: Secondary | ICD-10-CM | POA: Diagnosis not present

## 2017-03-08 DIAGNOSIS — Z01818 Encounter for other preprocedural examination: Secondary | ICD-10-CM | POA: Insufficient documentation

## 2017-03-08 HISTORY — DX: Liver cell carcinoma: C22.0

## 2017-03-08 LAB — CBC WITH DIFFERENTIAL/PLATELET
BASOS ABS: 0.1 10*3/uL (ref 0.0–0.1)
BASOS PCT: 1 %
EOS PCT: 2 %
Eosinophils Absolute: 0.1 10*3/uL (ref 0.0–0.7)
HEMATOCRIT: 44.9 % (ref 39.0–52.0)
Hemoglobin: 15.6 g/dL (ref 13.0–17.0)
LYMPHS PCT: 26 %
Lymphs Abs: 1.6 10*3/uL (ref 0.7–4.0)
MCH: 35.5 pg — ABNORMAL HIGH (ref 26.0–34.0)
MCHC: 34.7 g/dL (ref 30.0–36.0)
MCV: 102.3 fL — AB (ref 78.0–100.0)
MONO ABS: 0.9 10*3/uL (ref 0.1–1.0)
Monocytes Relative: 15 %
NEUTROS ABS: 3.5 10*3/uL (ref 1.7–7.7)
Neutrophils Relative %: 56 %
PLATELETS: 174 10*3/uL (ref 150–400)
RBC: 4.39 MIL/uL (ref 4.22–5.81)
RDW: 13.2 % (ref 11.5–15.5)
WBC: 6.2 10*3/uL (ref 4.0–10.5)

## 2017-03-08 LAB — COMPREHENSIVE METABOLIC PANEL
ALBUMIN: 3.6 g/dL (ref 3.5–5.0)
ALT: 27 U/L (ref 17–63)
AST: 37 U/L (ref 15–41)
Alkaline Phosphatase: 72 U/L (ref 38–126)
Anion gap: 6 (ref 5–15)
BUN: 10 mg/dL (ref 6–20)
CHLORIDE: 104 mmol/L (ref 101–111)
CO2: 27 mmol/L (ref 22–32)
CREATININE: 0.9 mg/dL (ref 0.61–1.24)
Calcium: 9 mg/dL (ref 8.9–10.3)
GFR calc Af Amer: >60 mL/min (ref >60)
GFR calc non Af Amer: >60 mL/min (ref >60)
GLUCOSE: 72 mg/dL (ref 65–99)
POTASSIUM: 4.4 mmol/L (ref 3.5–5.1)
Sodium: 137 mmol/L (ref 135–145)
Total Bilirubin: 0.4 mg/dL (ref 0.3–1.2)
Total Protein: 7.5 g/dL (ref 6.5–8.1)

## 2017-03-08 LAB — ABO/RH: ABO/RH(D): O POS

## 2017-03-08 LAB — PROTIME-INR
INR: 1.12
Prothrombin Time: 14.5 seconds (ref 11.4–15.2)

## 2017-03-08 NOTE — Progress Notes (Signed)
SPOKE WITH DR ROSE ANESTHESIA ANESTHESIA WILL SEE PATIENT DAY OF SURGERY 03-15-17

## 2017-03-08 NOTE — Progress Notes (Signed)
Spoke with tiffany in if, use Maggie trevor schick on consent for procedure

## 2017-03-08 NOTE — Progress Notes (Signed)
ECHO 03-24-16 EPIC EKG 10-03-16 EPIC LOV DR Johnsie Cancel CARDIO 06-14-16 EPIC

## 2017-03-14 ENCOUNTER — Other Ambulatory Visit: Payer: Self-pay | Admitting: Radiology

## 2017-03-15 ENCOUNTER — Encounter (HOSPITAL_COMMUNITY): Payer: Self-pay

## 2017-03-15 ENCOUNTER — Ambulatory Visit (HOSPITAL_COMMUNITY): Payer: Medicare HMO | Admitting: Certified Registered Nurse Anesthetist

## 2017-03-15 ENCOUNTER — Ambulatory Visit (HOSPITAL_COMMUNITY)
Admission: RE | Admit: 2017-03-15 | Discharge: 2017-03-15 | Disposition: A | Discharge status: Home / Self Care | Payer: Medicare HMO | Source: Ambulatory Visit | Attending: Interventional Radiology | Admitting: Interventional Radiology

## 2017-03-15 ENCOUNTER — Encounter (HOSPITAL_COMMUNITY)
Admission: RE | Disposition: A | Discharge status: Home / Self Care | Payer: Self-pay | Source: Ambulatory Visit | Attending: Interventional Radiology

## 2017-03-15 ENCOUNTER — Encounter (HOSPITAL_COMMUNITY): Payer: Self-pay | Admitting: *Deleted

## 2017-03-15 ENCOUNTER — Observation Stay (HOSPITAL_COMMUNITY)
Admission: RE | Admit: 2017-03-15 | Discharge: 2017-03-16 | Disposition: A | Discharge status: Home / Self Care | Payer: Medicare HMO | Source: Ambulatory Visit | Attending: Interventional Radiology | Admitting: Interventional Radiology

## 2017-03-15 DIAGNOSIS — Z89512 Acquired absence of left leg below knee: Secondary | ICD-10-CM | POA: Insufficient documentation

## 2017-03-15 DIAGNOSIS — B1920 Unspecified viral hepatitis C without hepatic coma: Secondary | ICD-10-CM | POA: Insufficient documentation

## 2017-03-15 DIAGNOSIS — M1990 Unspecified osteoarthritis, unspecified site: Secondary | ICD-10-CM | POA: Insufficient documentation

## 2017-03-15 DIAGNOSIS — Z7982 Long term (current) use of aspirin: Secondary | ICD-10-CM

## 2017-03-15 DIAGNOSIS — K7460 Unspecified cirrhosis of liver: Secondary | ICD-10-CM

## 2017-03-15 DIAGNOSIS — C220 Liver cell carcinoma: Secondary | ICD-10-CM | POA: Insufficient documentation

## 2017-03-15 DIAGNOSIS — F17210 Nicotine dependence, cigarettes, uncomplicated: Secondary | ICD-10-CM | POA: Insufficient documentation

## 2017-03-15 DIAGNOSIS — I4891 Unspecified atrial fibrillation: Secondary | ICD-10-CM | POA: Insufficient documentation

## 2017-03-15 DIAGNOSIS — I10 Essential (primary) hypertension: Secondary | ICD-10-CM | POA: Insufficient documentation

## 2017-03-15 HISTORY — DX: Unspecified fall, initial encounter: W19.XXXA

## 2017-03-15 HISTORY — DX: Cardiac arrhythmia, unspecified: I49.9

## 2017-03-15 HISTORY — PX: RADIOFREQUENCY ABLATION: SHX2290

## 2017-03-15 HISTORY — DX: Rider (driver) (passenger) of other motorcycle injured in unspecified traffic accident, initial encounter: V29.99XA

## 2017-03-15 HISTORY — DX: Pneumonia, unspecified organism: J18.9

## 2017-03-15 HISTORY — DX: Dyspnea, unspecified: R06.00

## 2017-03-15 HISTORY — DX: Fracture of one rib, unspecified side, initial encounter for closed fracture: S22.39XA

## 2017-03-15 HISTORY — DX: Multiple fractures of ribs, unspecified side, initial encounter for closed fracture: S22.49XA

## 2017-03-15 HISTORY — DX: Unspecified atrial fibrillation: I48.91

## 2017-03-15 LAB — TYPE AND SCREEN
ABO/RH(D): O POS
ANTIBODY SCREEN: NEGATIVE

## 2017-03-15 SURGERY — RADIO FREQUENCY ABLATION
Anesthesia: General

## 2017-03-15 MED ORDER — MIDAZOLAM HCL 5 MG/5ML IJ SOLN
INTRAMUSCULAR | Status: DC | PRN
  Administered 2017-03-15: 2 mg via INTRAVENOUS

## 2017-03-15 MED ORDER — FENTANYL CITRATE (PF) 100 MCG/2ML IJ SOLN: 25.0000 ug | INTRAMUSCULAR | Status: DC | PRN

## 2017-03-15 MED ORDER — MIDAZOLAM HCL 2 MG/2ML IJ SOLN
INTRAMUSCULAR | Status: AC
  Filled 2017-03-15: qty 2

## 2017-03-15 MED ORDER — HYDROCODONE-ACETAMINOPHEN 5-325 MG PO TABS
1.0000 | ORAL_TABLET | ORAL | Status: DC | PRN
  Administered 2017-03-15 (×2): 1 via ORAL
  Administered 2017-03-16: 2 via ORAL
  Filled 2017-03-15 (×3): qty 1

## 2017-03-15 MED ORDER — PROMETHAZINE HCL 25 MG/ML IJ SOLN: 6.2500 mg | INTRAMUSCULAR | Status: DC | PRN

## 2017-03-15 MED ORDER — DOCUSATE SODIUM 100 MG PO CAPS
100.0000 mg | ORAL_CAPSULE | Freq: Two times a day (BID) | ORAL | Status: DC
  Administered 2017-03-16: 100 mg via ORAL
  Filled 2017-03-15: qty 1

## 2017-03-15 MED ORDER — SODIUM CHLORIDE 0.9 % IV SOLN
INTRAVENOUS | Status: AC
  Filled 2017-03-15: qty 250

## 2017-03-15 MED ORDER — PHENYLEPHRINE 40 MCG/ML (10ML) SYRINGE FOR IV PUSH (FOR BLOOD PRESSURE SUPPORT)
PREFILLED_SYRINGE | INTRAVENOUS | Status: DC | PRN
  Administered 2017-03-15 (×3): 80 ug via INTRAVENOUS

## 2017-03-15 MED ORDER — FENTANYL CITRATE (PF) 100 MCG/2ML IJ SOLN
INTRAMUSCULAR | Status: DC | PRN
  Administered 2017-03-15: 100 ug via INTRAVENOUS
  Administered 2017-03-15: 50 ug via INTRAVENOUS

## 2017-03-15 MED ORDER — ATENOLOL 25 MG PO TABS
25.0000 mg | ORAL_TABLET | Freq: Every day | ORAL | Status: DC
  Administered 2017-03-16: 25 mg via ORAL
  Filled 2017-03-15: qty 1

## 2017-03-15 MED ORDER — MONTELUKAST SODIUM 10 MG PO TABS
10.0000 mg | ORAL_TABLET | Freq: Every day | ORAL | Status: DC
  Administered 2017-03-16: 10 mg via ORAL
  Filled 2017-03-15: qty 1

## 2017-03-15 MED ORDER — ALBUTEROL SULFATE HFA 108 (90 BASE) MCG/ACT IN AERS: 1.0000 | INHALATION_SPRAY | RESPIRATORY_TRACT | Status: DC | PRN

## 2017-03-15 MED ORDER — HYDROXYZINE HCL 50 MG PO TABS
50.0000 mg | ORAL_TABLET | Freq: Every evening | ORAL | Status: DC | PRN
  Filled 2017-03-15: qty 2

## 2017-03-15 MED ORDER — MAGNESIUM OXIDE 400 (241.3 MG) MG PO TABS
400.0000 mg | ORAL_TABLET | Freq: Every day | ORAL | Status: DC
  Administered 2017-03-15 – 2017-03-16 (×2): 400 mg via ORAL
  Filled 2017-03-15 (×2): qty 1

## 2017-03-15 MED ORDER — FOLIC ACID 1 MG PO TABS
1.0000 mg | ORAL_TABLET | Freq: Every day | ORAL | Status: DC
  Administered 2017-03-15 – 2017-03-16 (×2): 1 mg via ORAL
  Filled 2017-03-15 (×2): qty 1

## 2017-03-15 MED ORDER — LACTATED RINGERS IV SOLN
INTRAVENOUS | Status: DC
  Administered 2017-03-15 (×3): via INTRAVENOUS

## 2017-03-15 MED ORDER — ONDANSETRON HCL 4 MG/2ML IJ SOLN: 4.0000 mg | Freq: Four times a day (QID) | INTRAMUSCULAR | Status: DC | PRN

## 2017-03-15 MED ORDER — SUGAMMADEX SODIUM 200 MG/2ML IV SOLN
INTRAVENOUS | Status: DC | PRN
  Administered 2017-03-15: 200 mg via INTRAVENOUS

## 2017-03-15 MED ORDER — ROCURONIUM BROMIDE 50 MG/5ML IV SOSY
PREFILLED_SYRINGE | INTRAVENOUS | Status: DC | PRN
  Administered 2017-03-15: 20 mg via INTRAVENOUS
  Administered 2017-03-15: 45 mg via INTRAVENOUS

## 2017-03-15 MED ORDER — LIDOCAINE 2% (20 MG/ML) 5 ML SYRINGE
INTRAMUSCULAR | Status: DC | PRN
  Administered 2017-03-15: 60 mg via INTRAVENOUS

## 2017-03-15 MED ORDER — FENTANYL CITRATE (PF) 250 MCG/5ML IJ SOLN
INTRAMUSCULAR | Status: AC
  Filled 2017-03-15: qty 10

## 2017-03-15 MED ORDER — ONDANSETRON HCL 4 MG/2ML IJ SOLN
INTRAMUSCULAR | Status: DC | PRN
  Administered 2017-03-15: 4 mg via INTRAVENOUS

## 2017-03-15 MED ORDER — IOPAMIDOL (ISOVUE-300) INJECTION 61%
INTRAVENOUS | Status: AC
  Filled 2017-03-15: qty 30

## 2017-03-15 MED ORDER — DILTIAZEM HCL ER COATED BEADS 180 MG PO CP24
180.0000 mg | ORAL_CAPSULE | Freq: Every day | ORAL | Status: DC
  Administered 2017-03-16: 180 mg via ORAL
  Filled 2017-03-15: qty 1

## 2017-03-15 MED ORDER — EPHEDRINE SULFATE-NACL 50-0.9 MG/10ML-% IV SOSY
PREFILLED_SYRINGE | INTRAVENOUS | Status: DC | PRN
  Administered 2017-03-15 (×3): 10 mg via INTRAVENOUS
  Administered 2017-03-15: 15 mg via INTRAVENOUS
  Administered 2017-03-15: 5 mg via INTRAVENOUS
  Administered 2017-03-15: 10 mg via INTRAVENOUS

## 2017-03-15 MED ORDER — ALBUTEROL SULFATE (2.5 MG/3ML) 0.083% IN NEBU: 2.5000 mg | INHALATION_SOLUTION | RESPIRATORY_TRACT | Status: DC | PRN

## 2017-03-15 MED ORDER — GLYCOPYRROLATE 0.2 MG/ML IV SOSY
PREFILLED_SYRINGE | INTRAVENOUS | Status: DC | PRN
  Administered 2017-03-15: .2 mg via INTRAVENOUS

## 2017-03-15 MED ORDER — PIPERACILLIN-TAZOBACTAM 3.375 G IVPB
3.3750 g | Freq: Once | INTRAVENOUS | Status: AC
  Administered 2017-03-15: 3.375 g via INTRAVENOUS
  Filled 2017-03-15: qty 50

## 2017-03-15 MED ORDER — PROPOFOL 10 MG/ML IV BOLUS
INTRAVENOUS | Status: DC | PRN
  Administered 2017-03-15: 110 mg via INTRAVENOUS
  Administered 2017-03-15: 30 mg via INTRAVENOUS
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
## 2017-03-15 NOTE — Procedures (Signed)
[place]  S/p CT MWA RT HEPATIC MASS  No comp Stable Full report in PACS

## 2017-03-15 NOTE — Anesthesia Postprocedure Evaluation (Signed)
Anesthesia Post Note  Patient: Alejandro Rice  Procedure(s) Performed: Procedure(s) (LRB): MICROWAVE THERMAL ABLATION (N/A)     Patient location during evaluation: PACU Anesthesia Type: General Level of consciousness: awake and alert Pain management: pain level controlled Vital Signs Assessment: post-procedure vital signs reviewed and stable Respiratory status: spontaneous breathing, nonlabored ventilation, respiratory function stable and patient connected to nasal cannula oxygen Cardiovascular status: blood pressure returned to baseline and stable Postop Assessment: no signs of nausea or vomiting Anesthetic complications: no    Last Vitals:  Vitals:   03/15/17 1430 03/15/17 1500  BP: 137/81 (!) 149/89  Pulse: 65 69  Resp: (!) 22 19  Temp:  36.7 C    Last Pain:  Vitals:   03/15/17 0900  TempSrc: Oral                 Shreyas Piatkowski,W. EDMOND

## 2017-03-15 NOTE — Transfer of Care (Signed)
Immediate Anesthesia Transfer of Care Note  Patient: Alejandro Rice  Procedure(s) Performed: Procedure(s): MICROWAVE THERMAL ABLATION (N/A)  Patient Location: PACU  Anesthesia Type:General  Level of Consciousness: Patient easily awoken, sedated, comfortable, cooperative, following commands, responds to stimulation.   Airway & Oxygen Therapy: Patient spontaneously breathing, ventilating well, oxygen via simple oxygen mask.  Post-op Assessment: Report given to PACU RN, vital signs reviewed and stable, moving all extremities.   Post vital signs: Reviewed and stable.  Complications: No apparent anesthesia complications  Last Vitals:  Vitals:   03/15/17 1412 03/15/17 1415  BP: 132/85 137/82  Pulse: 70 72  Resp: 14 17  Temp:      Last Pain:  Vitals:   03/15/17 0900  TempSrc: Oral         Complications: No apparent anesthesia complications

## 2017-03-15 NOTE — Anesthesia Preprocedure Evaluation (Addendum)
Anesthesia Evaluation  Patient identified by MRN, date of birth, ID band Patient awake    Reviewed: Allergy & Precautions, NPO status , Patient's Chart, lab work & pertinent test results, reviewed documented beta blocker date and time   Airway Mallampati: II  TM Distance: >3 FB Neck ROM: Full    Dental  (+) Dental Advisory Given   Pulmonary Current Smoker,    breath sounds clear to auscultation       Cardiovascular hypertension, Pt. on medications and Pt. on home beta blockers + dysrhythmias Atrial Fibrillation  Rhythm:Regular Rate:Normal  Study Conclusions  - Left ventricle: The cavity size was normal. Wall thickness was  increased in a pattern of mild LVH. Systolic function was normal.The estimated ejection fraction was in the range of 60% to 65%.  The study is not technically sufficient to allow evaluation of LV  diastolic function. - Aortic valve: There was trivial regurgitation.   Neuro/Psych    GI/Hepatic negative GI ROS, (+) Hepatitis -, C  Endo/Other  negative endocrine ROS  Renal/GU negative Renal ROS     Musculoskeletal  (+) Arthritis ,   Abdominal   Peds  Hematology negative hematology ROS (+)   Anesthesia Other Findings   Reproductive/Obstetrics                            Lab Results  Component Value Date   WBC 6.2 03/08/2017   HGB 15.6 03/08/2017   HCT 44.9 03/08/2017   MCV 102.3 (H) 03/08/2017   PLT 174 03/08/2017   Lab Results  Component Value Date   CREATININE 0.90 03/08/2017   BUN 10 03/08/2017   NA 137 03/08/2017   K 4.4 03/08/2017   CL 104 03/08/2017   CO2 27 03/08/2017    Anesthesia Physical Anesthesia Plan  ASA: III  Anesthesia Plan: General   Post-op Pain Management:    Induction: Intravenous  PONV Risk Score and Plan: 1 and Ondansetron and Dexamethasone  Airway Management Planned: Oral ETT  Additional Equipment:   Intra-op Plan:    Post-operative Plan: Extubation in OR  Informed Consent: I have reviewed the patients History and Physical, chart, labs and discussed the procedure including the risks, benefits and alternatives for the proposed anesthesia with the patient or authorized representative who has indicated his/her understanding and acceptance.   Dental advisory given  Plan Discussed with: CRNA  Anesthesia Plan Comments:        Anesthesia Quick Evaluation

## 2017-03-15 NOTE — Anesthesia Procedure Notes (Signed)
Procedure Name: Intubation Date/Time: 03/15/2017 12:16 PM Performed by: Leldon Steege, Virgel Gess Pre-anesthesia Checklist: Patient identified, Emergency Drugs available, Suction available, Patient being monitored and Timeout performed Patient Re-evaluated:Patient Re-evaluated prior to induction Oxygen Delivery Method: Circle system utilized Preoxygenation: Pre-oxygenation with 100% oxygen Induction Type: IV induction Ventilation: Mask ventilation without difficulty Laryngoscope Size: Mac and 4 Grade View: Grade I Tube type: Oral Tube size: 7.5 mm Number of attempts: 1 Airway Equipment and Method: Stylet Placement Confirmation: ETT inserted through vocal cords under direct vision,  positive ETCO2,  CO2 detector and breath sounds checked- equal and bilateral Secured at: 23 cm Tube secured with: Tape Dental Injury: Teeth and Oropharynx as per pre-operative assessment

## 2017-03-16 ENCOUNTER — Other Ambulatory Visit: Payer: Self-pay | Admitting: Physician Assistant

## 2017-03-16 DIAGNOSIS — C220 Liver cell carcinoma: Secondary | ICD-10-CM | POA: Diagnosis not present

## 2017-03-16 LAB — CBC WITH DIFFERENTIAL/PLATELET
BASOS PCT: 0 %
Basophils Absolute: 0 10*3/uL (ref 0.0–0.1)
EOS PCT: 2 %
Eosinophils Absolute: 0.1 10*3/uL (ref 0.0–0.7)
HEMATOCRIT: 42.1 % (ref 39.0–52.0)
HEMOGLOBIN: 14.6 g/dL (ref 13.0–17.0)
Lymphocytes Relative: 22 %
Lymphs Abs: 1.5 10*3/uL (ref 0.7–4.0)
MCH: 35.9 pg — ABNORMAL HIGH (ref 26.0–34.0)
MCHC: 34.7 g/dL (ref 30.0–36.0)
MCV: 103.4 fL — AB (ref 78.0–100.0)
MONO ABS: 0.7 10*3/uL (ref 0.1–1.0)
MONOS PCT: 10 %
NEUTROS PCT: 66 %
Neutro Abs: 4.3 10*3/uL (ref 1.7–7.7)
Platelets: 156 10*3/uL (ref 150–400)
RBC: 4.07 MIL/uL — AB (ref 4.22–5.81)
RDW: 13.3 % (ref 11.5–15.5)
WBC: 6.6 10*3/uL (ref 4.0–10.5)

## 2017-03-16 LAB — COMPREHENSIVE METABOLIC PANEL
ALK PHOS: 64 U/L (ref 38–126)
ALT: 33 U/L (ref 17–63)
AST: 98 U/L — AB (ref 15–41)
Albumin: 2.9 g/dL — ABNORMAL LOW (ref 3.5–5.0)
Anion gap: 8 (ref 5–15)
BILIRUBIN TOTAL: 1.3 mg/dL — AB (ref 0.3–1.2)
BUN: 5 mg/dL — AB (ref 6–20)
CALCIUM: 8.4 mg/dL — AB (ref 8.9–10.3)
CHLORIDE: 101 mmol/L (ref 101–111)
CO2: 25 mmol/L (ref 22–32)
CREATININE: 0.72 mg/dL (ref 0.61–1.24)
Glucose, Bld: 105 mg/dL — ABNORMAL HIGH (ref 65–99)
Potassium: 4.9 mmol/L (ref 3.5–5.1)
Sodium: 134 mmol/L — ABNORMAL LOW (ref 135–145)
TOTAL PROTEIN: 6 g/dL — AB (ref 6.5–8.1)

## 2017-03-16 MED ORDER — PHENOL 1.4 % MT LIQD
1.0000 | OROMUCOSAL | Status: DC | PRN
  Administered 2017-03-16: 1 via OROMUCOSAL
  Filled 2017-03-16: qty 177

## 2017-03-16 NOTE — Progress Notes (Signed)
Patient d/c home. Stable. 
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
## 2017-03-16 NOTE — Progress Notes (Signed)
Discussed pain med with patient.Prescription given. Discharge instructions given to patient,verbalized understanding.

## 2017-03-16 NOTE — Progress Notes (Signed)
  Patient requested pain medications to manage acute post operative pain at the site and RUQ.  Rx for Norco 5/325 one by mouth every 4 hours as needed for pain Disp: #20 (twenty) No refills  Discussed with patient the risks for narcotic pain medications, risk of dependency, constipation, and other side effects.  He is aware.  Makya Phillis S Kashonda Sarkisyan PA-C 03/16/2017 9:56 AM

## 2017-04-17 ENCOUNTER — Ambulatory Visit
Admission: RE | Admit: 2017-04-17 | Discharge: 2017-04-17 | Disposition: A | Discharge status: Home / Self Care | Payer: Medicare HMO | Source: Ambulatory Visit | Attending: Physician Assistant | Admitting: Physician Assistant

## 2017-04-17 DIAGNOSIS — C220 Liver cell carcinoma: Secondary | ICD-10-CM

## 2017-04-17 HISTORY — PX: IR RADIOLOGIST EVAL & MGMT: IMG5224
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
## 2017-05-08 ENCOUNTER — Other Ambulatory Visit: Payer: Self-pay

## 2017-05-08 ENCOUNTER — Ambulatory Visit (INDEPENDENT_AMBULATORY_CARE_PROVIDER_SITE_OTHER): Payer: Medicare HMO | Admitting: Gastroenterology

## 2017-05-08 ENCOUNTER — Encounter: Payer: Self-pay | Admitting: Gastroenterology

## 2017-05-08 DIAGNOSIS — C220 Liver cell carcinoma: Secondary | ICD-10-CM | POA: Diagnosis not present

## 2017-05-08 DIAGNOSIS — B182 Chronic viral hepatitis C: Secondary | ICD-10-CM

## 2017-05-08 DIAGNOSIS — K7469 Other cirrhosis of liver: Secondary | ICD-10-CM | POA: Diagnosis not present

## 2017-05-08 DIAGNOSIS — K7460 Unspecified cirrhosis of liver: Secondary | ICD-10-CM

## 2017-05-08 NOTE — Patient Instructions (Addendum)
CALL WHEN YOU RECEIVE THE HARVONI. THE PAPERWORK HAS BEEN SUBMITTED TODAY. DO NOT START HARVONI UNTIL YOU HAVE NOTIFIED THE OFFICE. YOU WILL NEED YOUR VIRAL LOAD CHECKED 4 WEEKS AFTER STARTING HARVONI.  WHEN YOU START HARVONI DO NOT TAKE PRILOSEC, OMEPRAZOLE OR ANY ACID REDUCING MEDICINE UNLESS YOU CLEAR IT WITH OUR OFFICE.  COMPLETE HEP A/B VACCINE.  COMPLETE UPPER ENDOSCOPY.  COMPLETE CT & LABS AFTER OCT 7. I DISCUSSED YOU FOLLOW UP IMAGING AND LABS WITH DR. Reesa Chew.  FOLLOW UP IN 6 MOS.
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
## 2017-05-08 NOTE — Assessment & Plan Note (Signed)
WELL COMPENSATED DISEASE-GENOTYPE 1a WITH CIRRHOSIS.  CALL WHEN YOU RECEIVE THE HARVONI. THE PAPERWORK HAS BEEN SUBMITTED TODAY. DO NOT START HARVONI UNTIL YOU HAVE NOTIFIED THE OFFICE. YOU WILL NEED YOUR VIRAL LOAD CHECKED 4 WEEKS AFTER STARTING HARVONI. WHEN YOU START HARVONI DO NOT TAKE PRILOSEC, OMEPRAZOLE OR ANY ACID REDUCING MEDICINE UNLESS CLEAR IT WITH OUR OFFICE. FOLLOW UP IN 6 MOS.

## 2017-05-08 NOTE — Assessment & Plan Note (Signed)
COMPLETED ABLATION. WELL COMPENSATED DISEASE.  COMPLETE CT/LABS AFTER OCT 7. I PERSONALLY DISCUSSED WITH DR. Reesa Chew. FOLLOW UP IN 6 MOS.

## 2017-05-08 NOTE — Assessment & Plan Note (Signed)
WELL COMPENSATED DISEASE. COMPLETED ABLATION FOR HCC.  TREAT HEP C. COMPLETE HEP A/B VACCINE. COMPLETE UPPER ENDOSCOPY TO SCREEN FOR VARICES. DISCUSSED PROCEDURE, BENEFITS, & RISKS: < 1% chance of medication reaction, bleeding, OR perforation. PHENERGAN 12.5 MG IV IN PREOP. FOLLOW UP IN 6 MOS.

## 2017-05-09 LAB — CBC WITH DIFFERENTIAL/PLATELET
Basophils Absolute: 51 cells/uL (ref 0–200)
Basophils Relative: 1 %
EOS ABS: 112 {cells}/uL (ref 15–500)
Eosinophils Relative: 2.2 %
HEMATOCRIT: 42.9 % (ref 38.5–50.0)
Hemoglobin: 15.1 g/dL (ref 13.2–17.1)
LYMPHS ABS: 1142 {cells}/uL (ref 850–3900)
MCH: 35.3 pg — AB (ref 27.0–33.0)
MCHC: 35.2 g/dL (ref 32.0–36.0)
MCV: 100.2 fL — AB (ref 80.0–100.0)
MPV: 9.8 fL (ref 7.5–12.5)
Monocytes Relative: 11.6 %
Neutro Abs: 3203 cells/uL (ref 1500–7800)
Neutrophils Relative %: 62.8 %
PLATELETS: 170 10*3/uL (ref 140–400)
RBC: 4.28 10*6/uL (ref 4.20–5.80)
RDW: 11.9 % (ref 11.0–15.0)
Total Lymphocyte: 22.4 %
WBC: 5.1 10*3/uL (ref 3.8–10.8)
WBCMIX: 592 {cells}/uL (ref 200–950)

## 2017-05-09 LAB — COMPLETE METABOLIC PANEL WITH GFR
AG Ratio: 1.1 (calc) (ref 1.0–2.5)
ALT: 20 U/L (ref 9–46)
AST: 26 U/L (ref 10–35)
Albumin: 3.6 g/dL (ref 3.6–5.1)
Alkaline phosphatase (APISO): 69 U/L (ref 40–115)
BILIRUBIN TOTAL: 0.4 mg/dL (ref 0.2–1.2)
BUN: 10 mg/dL (ref 7–25)
CHLORIDE: 105 mmol/L (ref 98–110)
CO2: 25 mmol/L (ref 20–32)
Calcium: 9.1 mg/dL (ref 8.6–10.3)
Creat: 0.85 mg/dL (ref 0.70–1.25)
GFR, EST AFRICAN AMERICAN: 109 mL/min/{1.73_m2} (ref >=60)
GFR, EST NON AFRICAN AMERICAN: 94 mL/min/{1.73_m2} (ref >=60)
GLOBULIN: 3.2 g/dL (ref 1.9–3.7)
Glucose, Bld: 101 mg/dL (ref 65–139)
Potassium: 4.1 mmol/L (ref 3.5–5.3)
SODIUM: 140 mmol/L (ref 135–146)
TOTAL PROTEIN: 6.8 g/dL (ref 6.1–8.1)

## 2017-05-09 LAB — PROTIME-INR
INR: 1.1
PROTHROMBIN TIME: 12 s — AB (ref 9.0–11.5)

## 2017-05-09 NOTE — Patient Instructions (Signed)
PA info for CT abd w/wo contrast submitted via [employer] website. Case approved. PA# [id number], 06/12/17-07/12/17.

## 2017-05-14 ENCOUNTER — Telehealth: Payer: Self-pay

## 2017-05-14 DIAGNOSIS — B1920 Unspecified viral hepatitis C without hepatic coma: Secondary | ICD-10-CM

## 2017-05-14 NOTE — Telephone Encounter (Signed)
Pt called- he received his harvoni today. He was looking through the paperwork and it said something about magnesium. Pt was concerned because he takes magnesium 500mg  daily. I advised the pt to not start harvoni until I talk with SLF and call him back. He is aware that it will be tomorrow before I can get back with him and he will not take it until he hears from me.   Dr.Fields, is it ok for him to take magnesium 500mg  a day with the harvoni and is there any other specific instructions for him other than to not take ppi and he needs HCV RNA at 4 weeks?

## 2017-05-15 ENCOUNTER — Other Ambulatory Visit: Payer: Self-pay

## 2017-05-15 DIAGNOSIS — B1920 Unspecified viral hepatitis C without hepatic coma: Secondary | ICD-10-CM

## 2017-05-15 NOTE — Telephone Encounter (Signed)
I had Alejandro Rice look it up and you are not supposed to take magnesium at the same time as Harvoni, but you can take it 4 hours before or after taking Harvoni.

## 2017-05-15 NOTE — Telephone Encounter (Signed)
I spoke with SLF, she is fine with pt taking magnesium 4 hours before harvoni or 4 hours after harvoni. Pt also needs labs in 4 weeks and no ppi's unless he calls [place] first.  I called and informed the pt, I told him to not start any new medications without contacting the office first. Lab order done and in the mail to the pt.  Pt stated he understood these instructions.

## 2017-05-16 ENCOUNTER — Other Ambulatory Visit: Payer: Self-pay

## 2017-05-16 DIAGNOSIS — B1920 Unspecified viral hepatitis C without hepatic coma: Secondary | ICD-10-CM

## 2017-05-16 NOTE — Telephone Encounter (Signed)
Lab orders done. I have mailed these out to the pt.

## 2017-05-16 NOTE — Telephone Encounter (Signed)
REVIEWED. CBC/CMP/HEP C VL 4 WEEKS AFTER HARVONI STARTS.

## 2017-05-27 NOTE — Telephone Encounter (Signed)
Pt called the office, he is supposed to get his last hep B vaccine tomorrow and wanted to make sure it was ok. I spoke with AB and verified it was ok. Pt is aware.
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
## 2017-06-12 ENCOUNTER — Ambulatory Visit (HOSPITAL_COMMUNITY)
Admission: RE | Admit: 2017-06-12 | Discharge: 2017-06-12 | Disposition: A | Discharge status: Home / Self Care | Payer: Medicare HMO | Source: Ambulatory Visit | Attending: Gastroenterology | Admitting: Gastroenterology

## 2017-06-12 DIAGNOSIS — E279 Disorder of adrenal gland, unspecified: Secondary | ICD-10-CM | POA: Insufficient documentation

## 2017-06-12 DIAGNOSIS — J439 Emphysema, unspecified: Secondary | ICD-10-CM | POA: Insufficient documentation

## 2017-06-12 DIAGNOSIS — K7460 Unspecified cirrhosis of liver: Secondary | ICD-10-CM | POA: Diagnosis not present

## 2017-06-12 DIAGNOSIS — Z9889 Other specified postprocedural states: Secondary | ICD-10-CM | POA: Diagnosis not present

## 2017-06-12 DIAGNOSIS — I700 Atherosclerosis of aorta: Secondary | ICD-10-CM | POA: Diagnosis not present

## 2017-06-12 DIAGNOSIS — C220 Liver cell carcinoma: Secondary | ICD-10-CM | POA: Insufficient documentation

## 2017-06-12 MED ORDER — IOPAMIDOL (ISOVUE-300) INJECTION 61%
100.0000 mL | Freq: Once | INTRAVENOUS | Status: AC | PRN
  Administered 2017-06-12: 100 mL via INTRAVENOUS

## 2017-06-14 ENCOUNTER — Ambulatory Visit (INDEPENDENT_AMBULATORY_CARE_PROVIDER_SITE_OTHER): Payer: Medicare HMO | Admitting: Cardiovascular Disease

## 2017-06-14 ENCOUNTER — Encounter: Payer: Self-pay | Admitting: Cardiovascular Disease

## 2017-06-14 VITALS — BP 124/68 | HR 55 | Ht 69.0 in | Wt 164.0 lb

## 2017-06-14 DIAGNOSIS — I480 Paroxysmal atrial fibrillation: Secondary | ICD-10-CM

## 2017-06-14 LAB — CBC WITH DIFFERENTIAL/PLATELET
BASOS ABS: 58 {cells}/uL (ref 0–200)
Basophils Relative: 0.7 %
EOS PCT: 3.5 %
Eosinophils Absolute: 291 cells/uL (ref 15–500)
HCT: 45.2 % (ref 38.5–50.0)
Hemoglobin: 16 g/dL (ref 13.2–17.1)
Lymphs Abs: 3270 cells/uL (ref 850–3900)
MCH: 35.3 pg — ABNORMAL HIGH (ref 27.0–33.0)
MCHC: 35.4 g/dL (ref 32.0–36.0)
MCV: 99.8 fL (ref 80.0–100.0)
MONOS PCT: 12.1 %
MPV: 10.1 fL (ref 7.5–12.5)
NEUTROS ABS: 3677 {cells}/uL (ref 1500–7800)
Neutrophils Relative %: 44.3 %
PLATELETS: 210 10*3/uL (ref 140–400)
RBC: 4.53 10*6/uL (ref 4.20–5.80)
RDW: 11.9 % (ref 11.0–15.0)
TOTAL LYMPHOCYTE: 39.4 %
WBC mixed population: 1004 cells/uL — ABNORMAL HIGH (ref 200–950)
WBC: 8.3 10*3/uL (ref 3.8–10.8)

## 2017-06-14 LAB — COMPREHENSIVE METABOLIC PANEL
AG Ratio: 1 (calc) (ref 1.0–2.5)
ALT: 9 U/L (ref 9–46)
AST: 18 U/L (ref 10–35)
Albumin: 3.9 g/dL (ref 3.6–5.1)
Alkaline phosphatase (APISO): 80 U/L (ref 40–115)
BUN: 13 mg/dL (ref 7–25)
CO2: 26 mmol/L (ref 20–32)
CREATININE: 0.89 mg/dL (ref 0.70–1.25)
Calcium: 9.4 mg/dL (ref 8.6–10.3)
Chloride: 104 mmol/L (ref 98–110)
GLUCOSE: 75 mg/dL (ref 65–99)
Globulin: 4 g/dL (calc) — ABNORMAL HIGH (ref 1.9–3.7)
Potassium: 4.1 mmol/L (ref 3.5–5.3)
SODIUM: 137 mmol/L (ref 135–146)
TOTAL PROTEIN: 7.9 g/dL (ref 6.1–8.1)
Total Bilirubin: 0.4 mg/dL (ref 0.2–1.2)

## 2017-06-14 NOTE — Patient Instructions (Signed)
Medication Instructions:  Your physician recommends that you continue on your current medications as directed. Please refer to the Current Medication list given to you today.   Labwork: NONE  Testing/Procedures: NONE   Follow-Up: Your physician wants you to follow-up in: 6 MONTHS Dr. Johnsie Cancel.  You will receive a reminder letter in the mail two months in advance. If you don't receive a letter, please call our office to schedule the follow-up appointment.   Any Other Special Instructions Will Be Listed Below (If Applicable).     If you need a refill on your cardiac medications before your next appointment, please call your pharmacy.  Thank you for choosing [facility]!

## 2017-06-17 ENCOUNTER — Encounter: Payer: Self-pay | Admitting: Interventional Radiology

## 2017-06-17 LAB — HEPATITIS C RNA QUANTITATIVE
HCV Quantitative Log: <1.18 Log IU/mL — AB
HCV RNA, PCR, QN: DETECTED [IU]/mL — AB

## 2017-06-20 ENCOUNTER — Encounter (HOSPITAL_COMMUNITY)
Admission: RE | Disposition: A | Discharge status: Home / Self Care | Payer: Self-pay | Source: Ambulatory Visit | Attending: Gastroenterology

## 2017-06-20 ENCOUNTER — Ambulatory Visit (HOSPITAL_COMMUNITY)
Admission: RE | Admit: 2017-06-20 | Discharge: 2017-06-20 | Disposition: A | Discharge status: Home / Self Care | Payer: Medicare HMO | Source: Ambulatory Visit | Attending: Gastroenterology | Admitting: Gastroenterology

## 2017-06-20 ENCOUNTER — Encounter (HOSPITAL_COMMUNITY): Payer: Self-pay

## 2017-06-20 DIAGNOSIS — K3189 Other diseases of stomach and duodenum: Secondary | ICD-10-CM | POA: Diagnosis not present

## 2017-06-20 DIAGNOSIS — Z7982 Long term (current) use of aspirin: Secondary | ICD-10-CM | POA: Insufficient documentation

## 2017-06-20 DIAGNOSIS — K449 Diaphragmatic hernia without obstruction or gangrene: Secondary | ICD-10-CM | POA: Insufficient documentation

## 2017-06-20 DIAGNOSIS — T39395A Adverse effect of other nonsteroidal anti-inflammatory drugs [NSAID], initial encounter: Secondary | ICD-10-CM

## 2017-06-20 DIAGNOSIS — B9681 Helicobacter pylori [H. pylori] as the cause of diseases classified elsewhere: Secondary | ICD-10-CM | POA: Diagnosis not present

## 2017-06-20 DIAGNOSIS — K311 Adult hypertrophic pyloric stenosis: Secondary | ICD-10-CM | POA: Diagnosis not present

## 2017-06-20 DIAGNOSIS — K2970 Gastritis, unspecified, without bleeding: Secondary | ICD-10-CM | POA: Insufficient documentation

## 2017-06-20 DIAGNOSIS — B1920 Unspecified viral hepatitis C without hepatic coma: Secondary | ICD-10-CM | POA: Diagnosis not present

## 2017-06-20 DIAGNOSIS — Z79899 Other long term (current) drug therapy: Secondary | ICD-10-CM | POA: Insufficient documentation

## 2017-06-20 DIAGNOSIS — I4891 Unspecified atrial fibrillation: Secondary | ICD-10-CM | POA: Insufficient documentation

## 2017-06-20 DIAGNOSIS — K2960 Other gastritis without bleeding: Secondary | ICD-10-CM | POA: Diagnosis not present

## 2017-06-20 DIAGNOSIS — K229 Disease of esophagus, unspecified: Secondary | ICD-10-CM | POA: Diagnosis not present

## 2017-06-20 DIAGNOSIS — F17210 Nicotine dependence, cigarettes, uncomplicated: Secondary | ICD-10-CM | POA: Diagnosis not present

## 2017-06-20 DIAGNOSIS — I10 Essential (primary) hypertension: Secondary | ICD-10-CM | POA: Insufficient documentation

## 2017-06-20 DIAGNOSIS — Z13810 Encounter for screening for upper gastrointestinal disorder: Secondary | ICD-10-CM | POA: Diagnosis not present

## 2017-06-20 DIAGNOSIS — K7460 Unspecified cirrhosis of liver: Secondary | ICD-10-CM | POA: Diagnosis not present

## 2017-06-20 HISTORY — PX: ESOPHAGOGASTRODUODENOSCOPY: SHX5428

## 2017-06-20 SURGERY — EGD (ESOPHAGOGASTRODUODENOSCOPY)
Anesthesia: Moderate Sedation

## 2017-06-20 MED ORDER — MIDAZOLAM HCL 5 MG/5ML IJ SOLN
INTRAMUSCULAR | Status: DC | PRN
  Administered 2017-06-20: 1 mg via INTRAVENOUS
  Administered 2017-06-20 (×2): 2 mg via INTRAVENOUS

## 2017-06-20 MED ORDER — MEPERIDINE HCL 100 MG/ML IJ SOLN
INTRAMUSCULAR | Status: AC
  Filled 2017-06-20: qty 2

## 2017-06-20 MED ORDER — PROMETHAZINE HCL 25 MG/ML IJ SOLN
INTRAMUSCULAR | Status: DC | PRN
  Administered 2017-06-20 (×2): 12.5 mg via INTRAVENOUS

## 2017-06-20 MED ORDER — OMEPRAZOLE 20 MG PO CPDR: DELAYED_RELEASE_CAPSULE | ORAL | 3 refills | Status: DC

## 2017-06-20 MED ORDER — LIDOCAINE VISCOUS 2 % MT SOLN
OROMUCOSAL | Status: DC | PRN
  Administered 2017-06-20: 1 via OROMUCOSAL

## 2017-06-20 MED ORDER — MIDAZOLAM HCL 5 MG/5ML IJ SOLN
INTRAMUSCULAR | Status: AC
  Filled 2017-06-20: qty 10

## 2017-06-20 MED ORDER — LIDOCAINE VISCOUS 2 % MT SOLN
OROMUCOSAL | Status: AC
  Filled 2017-06-20: qty 15

## 2017-06-20 MED ORDER — PROMETHAZINE HCL 25 MG/ML IJ SOLN
12.5000 mg | Freq: Once | INTRAMUSCULAR | Status: AC
  Administered 2017-06-20: 12.5 mg via INTRAVENOUS

## 2017-06-20 MED ORDER — SODIUM CHLORIDE 0.9% FLUSH
INTRAVENOUS | Status: AC
  Filled 2017-06-20: qty 10

## 2017-06-20 MED ORDER — SODIUM CHLORIDE 0.9 % IV SOLN
INTRAVENOUS | Status: DC
  Administered 2017-06-20: 10:00:00 via INTRAVENOUS

## 2017-06-20 MED ORDER — PROMETHAZINE HCL 25 MG/ML IJ SOLN
INTRAMUSCULAR | Status: AC
  Filled 2017-06-20: qty 1

## 2017-06-20 MED ORDER — MEPERIDINE HCL 100 MG/ML IJ SOLN
INTRAMUSCULAR | Status: DC | PRN
  Administered 2017-06-20: 25 mg via INTRAVENOUS
  Administered 2017-06-20: 50 mg via INTRAVENOUS

## 2017-06-20 NOTE — Discharge Instructions (Signed)
You have gastritis DUE TO YOUR USING ASPIRIN. I biopsied your stomach.   TO TREAT GASTRITIS AND PREVENT ULCERS, START CONTINUE OMEPRAZOLE.  TAKE 30 MINUTES PRIOR TO YOUR FIRST MEAL.  FOLLOW A LOW FAT DIET. MEATS SHOULD BE BAKED, BROILED, OR BOILED. AVOID FRIED FOODS. SEE INFO BELOW.  YOUR BIOPSY RESULTS WILL BE AVAILABLE IN MY CHART AFTER OCT 22 AND MY OFFICE WILL CONTACT YOU IN 10-14 DAYS WITH YOUR RESULTS.   FOLLOW UP IN MARCH 2019.   UPPER ENDOSCOPY AFTER CARE Read the instructions outlined below and refer to this sheet in the next week. These discharge instructions provide you with general information on caring for yourself after you leave the hospital. While your treatment has been planned according to the most current medical practices available, unavoidable complications occasionally occur. If you have any problems or questions after discharge, call DR. Rayshon Albaugh, [phone].  ACTIVITY  You may resume your regular activity, but move at a slower pace for the next 24 hours.   Take frequent rest periods for the next 24 hours.   Walking will help get rid of the air and reduce the bloated feeling in your belly (abdomen).   No driving for 24 hours (because of the medicine (anesthesia) used during the test).   You may shower.   Do not sign any important legal documents or operate any machinery for 24 hours (because of the anesthesia used during the test).    NUTRITION  Drink plenty of fluids.   You may resume your normal diet as instructed by your doctor.   Begin with a light meal and progress to your normal diet. Heavy or fried foods are harder to digest and may make you feel sick to your stomach (nauseated).   Avoid alcoholic beverages for 24 hours or as instructed.    MEDICATIONS  You may resume your normal medications.   WHAT YOU CAN EXPECT TODAY  Some feelings of bloating in the abdomen.   Passage of more gas than usual.    IF YOU HAD A BIOPSY TAKEN DURING THE  UPPER ENDOSCOPY:  Eat a soft diet IF YOU HAVE NAUSEA, BLOATING, ABDOMINAL PAIN, OR VOMITING.    FINDING OUT THE RESULTS OF YOUR TEST Not all test results are available during your visit. DR. Oneida Alar WILL CALL YOU WITHIN 14 DAYS OF YOUR PROCEDUE WITH YOUR RESULTS. Do not assume everything is normal if you have not heard from DR. Carlea Badour, CALL HER OFFICE AT [phone].  SEEK IMMEDIATE MEDICAL ATTENTION AND CALL THE OFFICE: [phone] IF:  You have more than a spotting of blood in your stool.   Your belly is swollen (abdominal distention).   You are nauseated or vomiting.   You have a temperature over 101F.   You have abdominal pain or discomfort that is severe or gets worse throughout the day.   Gastritis  Gastritis is an inflammation (the body's way of reacting to injury and/or infection) of the stomach. It is often caused by bacterial (germ) infections. It can also be caused BY ASPIRIN, BC/GOODY POWDER'S, (IBUPROFEN) MOTRIN, OR ALEVE (NAPROXEN), chemicals (including alcohol), SPICY FOODS, and medications. This illness may be associated with generalized malaise (feeling tired, not well), UPPER ABDOMINAL STOMACH cramps, and fever. One common bacterial cause of gastritis is an organism known as H. Pylori. This can be treated with antibiotics.    Low-Fat Diet BREADS, CEREALS, PASTA, RICE, DRIED PEAS, AND BEANS These products are high in carbohydrates and most are low in fat. Therefore, they  can be increased in the diet as substitutes for fatty foods. They too, however, contain calories and should not be eaten in excess. Cereals can be eaten for snacks as well as for breakfast.  Include foods that contain fiber (fruits, vegetables, whole grains, and legumes). Research shows that fiber may lower blood cholesterol levels, especially the water-soluble fiber found in fruits, vegetables, oat products, and legumes. FRUITS AND VEGETABLES It is good to eat fruits and vegetables. Besides being  sources of fiber, both are rich in vitamins and some minerals. They help you get the daily allowances of these nutrients. Fruits and vegetables can be used for snacks and desserts. MEATS Limit lean meat, chicken, [place], and fish to no more than 6 ounces per day. Beef, Pork, and Lamb Use lean cuts of beef, pork, and lamb. Lean cuts include:  Extra-lean ground beef.  Arm roast.  Sirloin tip.  Center-cut ham.  Round steak.  Loin chops.  Rump roast.  Tenderloin.  Trim all fat off the outside of meats before cooking. It is not necessary to severely decrease the intake of red meat, but lean choices should be made. Lean meat is rich in protein and contains a highly absorbable form of iron. Premenopausal women, in particular, should avoid reducing lean red meat because this could increase the risk for low red blood cells (iron-deficiency anemia).  Chicken and [place] These are good sources of protein. The fat of poultry can be reduced by removing the skin and underlying fat layers before cooking. Chicken and [place] can be substituted for lean red meat in the diet. Poultry should not be fried or covered with high-fat sauces. Fish and Shellfish Fish is a good source of protein. Shellfish contain cholesterol, but they usually are low in saturated fatty acids. The preparation of fish is important. Like chicken and [place], they should not be fried or covered with high-fat sauces. EGGS Egg whites contain no fat or cholesterol. They can be eaten often. Try 1 to 2 egg whites instead of whole eggs in recipes or use egg substitutes that do not contain yolk.  MILK AND DAIRY PRODUCTS Use skim or 1% milk instead of 2% or whole milk. Decrease whole milk, natural, and processed cheeses. Use nonfat or low-fat (2%) cottage cheese or low-fat cheeses made from vegetable oils. Choose nonfat or low-fat (1 to 2%) yogurt. Experiment with evaporated skim milk in recipes that call for heavy cream. Substitute low-fat yogurt or  low-fat cottage cheese for sour cream in dips and salad dressings. Have at least 2 servings of low-fat dairy products, such as 2 glasses of skim (or 1%) milk each day to help get your daily calcium intake.  FATS AND OILS Butterfat, lard, and beef fats are high in saturated fat and cholesterol. These should be avoided.Vegetable fats do not contain cholesterol. AVOID coconut oil, palm oil, and palm kernel oil, WHICH are very high in saturated fats. These should be limited. These fats are often used in bakery goods, processed foods, popcorn, oils, and nondairy creamers. Vegetable shortenings and some peanut butters contain hydrogenated oils, which are also saturated fats. Read the labels on these foods and check for saturated vegetable oils.  Desirable liquid vegetable oils are corn oil, cottonseed oil, olive oil, canola oil, safflower oil, soybean oil, and sunflower oil. Peanut oil is not as good, but small amounts are acceptable. Buy a heart-healthy tub margarine that has no partially hydrogenated oils in the ingredients. AVOID Mayonnaise and salad dressings often are made from  unsaturated fats.  OTHER EATING TIPS Snacks  Most sweets should be limited as snacks. They tend to be rich in calories and fats, and their caloric content outweighs their nutritional value. Some good choices in snacks are graham crackers, melba toast, soda crackers, bagels (no egg), English muffins, fruits, and vegetables. These snacks are preferable to snack crackers, [place] fries, and chips. Popcorn should be air-popped or cooked in small amounts of liquid vegetable oil.  Desserts Eat fruit, low-fat yogurt, and fruit ices instead of pastries, cake, and cookies. Sherbet, angel food cake, gelatin dessert, frozen low-fat yogurt, or other frozen products that do not contain saturated fat (pure fruit juice bars, frozen ice pops) are also acceptable.   COOKING METHODS Choose those methods that use little or no fat. They  include: Poaching.  Braising.  Steaming.  Grilling.  Baking.  Stir-frying.  Broiling.  Microwaving.  Foods can be cooked in a nonstick pan without added fat, or use a nonfat cooking spray in regular cookware. Limit fried foods and avoid frying in saturated fat. Add moisture to lean meats by using water, broth, cooking wines, and other nonfat or low-fat sauces along with the cooking methods mentioned above. Soups and stews should be chilled after cooking. The fat that forms on top after a few hours in the refrigerator should be skimmed off. When preparing meals, avoid using excess salt. Salt can contribute to raising blood pressure in some people.  EATING AWAY FROM HOME Order entres, potatoes, and vegetables without sauces or butter. When meat exceeds the size of a deck of cards (3 to 4 ounces), the rest can be taken home for another meal. Choose vegetable or fruit salads and ask for low-calorie salad dressings to be served on the side. Use dressings sparingly. Limit high-fat toppings, such as bacon, crumbled eggs, cheese, sunflower seeds, and olives. Ask for heart-healthy tub margarine instead of butter.
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
## 2017-06-20 NOTE — Op Note (Signed)
[facility] Patient Name: Alejandro Rice Procedure Date: 06/20/2017 11:00 AM MRN: [id number] Date of Birth: [birth year] Attending MD: Barney Drain MD, MD CSN: [id number] Age: 61 Admit Type: Outpatient Procedure:                Upper GI endoscopy with COLD FORCEPS BIOPSY Indications:              Screening for Barrett's esophagus, Variceal                            screening (no known varices or prior bleeding) Providers:                Barney Drain MD, MD, Selena Lesser, Aram Candela Referring MD:             Carolann Littler. Dean Medicines:                Promethazine 25 mg IV, Meperidine 75 mg IV,                            Midazolam 5 mg IV Complications:            No immediate complications. Estimated Blood Loss:     Estimated blood loss was minimal. Procedure:                Pre-Anesthesia Assessment:                           - Prior to the procedure, a History and Physical                            was performed, and patient medications and                            allergies were reviewed. The patient's tolerance of                            previous anesthesia was also reviewed. The risks                            and benefits of the procedure and the sedation                            options and risks were discussed with the patient.                            All questions were answered, and informed consent                            was obtained. Prior Anticoagulants: The patient has                            taken aspirin, last dose was day of procedure. ASA                            Grade Assessment: II - A patient with mild systemic  disease. After reviewing the risks and benefits,                            the patient was deemed in satisfactory condition to                            undergo the procedure. After obtaining informed                            consent, the endoscope was passed under direct   vision. Throughout the procedure, the patient's                            blood pressure, pulse, and oxygen saturations were                            monitored continuously. The [id number] ([id number])                            scope was introduced through the mouth, and                            advanced to the second part of duodenum. The upper                            GI endoscopy was somewhat difficult due to a                            J-shaped stomach which made pyloric intubation                            difficult. Successful completion of the procedure                            was aided by straightening and shortening the scope                            to obtain bowel loop reduction. The patient                            tolerated the procedure fairly well. Scope In: 11:33:15 AM Scope Out: 11:42:05 AM Total Procedure Duration: 0 hours 8 minutes 50 seconds  Findings:      The examined esophagus was normal.      A medium-sized hiatal hernia was present.      Diffuse moderate inflammation characterized by congestion (edema) and       erythema was found in the entire examined stomach. Biopsies were taken       with a cold forceps for Helicobacter pylori testing.      A deformity was found at the pylorus CONSISTENT WITH PRIOR PUD.      The examined duodenum was normal. Impression:               - Normal esophagus.NO VARICES.                           -  Medium-sized hiatal hernia.                           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 Moderate Sedation:      Moderate (conscious) sedation was administered by the endoscopy nurse       and supervised by the endoscopist. The following parameters were       monitored: oxygen saturation, heart rate, blood pressure, and response       to care. Total physician intraservice time was 22  minutes. Recommendation:           - Await pathology results.                           - Repeat upper endoscopy in 3 years for                            surveillance.                           - High fiber diet and low fat diet.                           - Continue present medications.                           - Use Prilosec (omeprazole) 20 mg PO daily                            indefinitely.                           - Return to my office in 2 months.                           - Patient has a contact number available for                            emergencies. The signs and symptoms of potential                            delayed complications were discussed with the                            patient. Return to normal activities tomorrow.                            Written discharge instructions were provided to the                            patient. Procedure Code(s):        ---  Professional ---                           [phone], Esophagogastroduodenoscopy, flexible,                            transoral; with biopsy, single or multiple                           99152, Moderate sedation services provided by the                            same physician or other qualified health care                            professional performing the diagnostic or                            therapeutic service that the sedation supports,                            requiring the presence of an independent trained                            observer to assist in the monitoring of the                            patient's level of consciousness and physiological                            status; initial 15 minutes of intraservice time,                            patient age 37 years or older Diagnosis Code(s):        --- Professional ---                           K44.9, Diaphragmatic hernia without obstruction or                            gangrene                           K29.70, Gastritis,  unspecified, without bleeding                           K31.89, Other diseases of stomach and duodenum                           Z13.810, Encounter for screening for upper                            gastrointestinal disorder CPT copyright 2016 American Medical Association. All rights reserved. The codes documented in this report are preliminary and upon coder review may  be revised to meet current compliance requirements. Barney Drain, MD Barney Drain MD, MD  06/20/2017 12:01:14 PM This report has been signed electronically. Number of Addenda: 0

## 2017-06-24 ENCOUNTER — Encounter (HOSPITAL_COMMUNITY): Payer: Self-pay | Admitting: Gastroenterology

## 2017-06-25 ENCOUNTER — Encounter: Payer: Self-pay | Admitting: Gastroenterology

## 2017-06-25 ENCOUNTER — Telehealth: Payer: Self-pay | Admitting: Gastroenterology

## 2017-06-25 DIAGNOSIS — E278 Other specified disorders of adrenal gland: Secondary | ICD-10-CM

## 2017-06-25 NOTE — Telephone Encounter (Signed)
Patient called back and is aware of results. He is fine with proceeding with PET scan. Please advise below regarding PET below

## 2017-06-25 NOTE — Telephone Encounter (Signed)
PLEASE CALL PT. HIS CT SHOWED A LEFT ADRENAL NODULE. HE NEES A PET SCAN TO MAKE SURE ITS IS BENIGN.

## 2017-06-25 NOTE — Telephone Encounter (Addendum)
LMOVM for pt  Dr. Oneida Alar for the PET scan, do you want skull base to thigh or whole body?

## 2017-06-25 NOTE — Telephone Encounter (Signed)
I PERSONALLY REVIEWED THE CT WITH DR. Reesa Chew. AGREES PT NEEDS PET SCAN TO COMPLETE WORK UP FOR LEFT ADRENAL NODULE.

## 2017-06-26 ENCOUNTER — Encounter: Payer: Self-pay | Admitting: *Deleted

## 2017-06-26 NOTE — Telephone Encounter (Signed)
Called central scheduling and LMOVM to schedule this

## 2017-06-26 NOTE — Telephone Encounter (Signed)
Discussed with Dr. Talbert Cage. Pt needs PET skull to mid-thigh.

## 2017-06-26 NOTE — Addendum Note (Signed)
Addended by: Danie Binder on: 06/26/2017 10:32 AM   Modules accepted: Orders

## 2017-06-26 NOTE — Telephone Encounter (Signed)
PET scheduled for 07/09/17 at [place] w/ arrival time 8:30am at [facility]. NPO after midnight. Called patient and made aware of appt information. Letter also mailed to him.

## 2017-07-01 NOTE — Telephone Encounter (Signed)
Precert done and awaiting approval

## 2017-07-01 NOTE — Telephone Encounter (Signed)
Received approval from [facility] for PET. Auth #: [id number]. This has been documented in order

## 2017-07-02 ENCOUNTER — Telehealth: Payer: Self-pay | Admitting: Gastroenterology

## 2017-07-02 ENCOUNTER — Other Ambulatory Visit: Payer: Self-pay

## 2017-07-02 DIAGNOSIS — K7460 Unspecified cirrhosis of liver: Secondary | ICD-10-CM

## 2017-07-02 DIAGNOSIS — B182 Chronic viral hepatitis C: Secondary | ICD-10-CM

## 2017-07-02 MED ORDER — CLARITHROMYCIN 500 MG PO TABS: ORAL_TABLET | ORAL | 0 refills | Status: DC

## 2017-07-02 MED ORDER — AMOXICILLIN 500 MG PO TABS: ORAL_TABLET | ORAL | 0 refills | Status: DC

## 2017-07-02 NOTE — Telephone Encounter (Signed)
Pt is aware.  

## 2017-07-02 NOTE — Telephone Encounter (Signed)
LMOM for a return call. Lab order on file for Feb. 2019.

## 2017-07-02 NOTE — Telephone Encounter (Signed)
PLEASE CALL PT. Her stomach Bx showed H. Pylori infection. She needs AMOXICILLIN 500 mg 2 po BID for 10 days and Biaxin 500 mg po bid for 10 days. She needs Omeprazole 20 mg BID for 10 days WHILE TAKING THE ABX then 1 po DAILY FORVER. Med side effects include NAUSEA, VOMITING, DIARRHEA, ABDOMINAL pain, and metallic taste.   FOLLOW A LOW FAT DIET. MEATS SHOULD BE BAKED, BROILED, OR BOILED. AVOID FRIED FOODS.    FOLLOW UP IN MARCH 2019 E 30 HEP C/H PYLORI GASTRITIS.

## 2017-07-02 NOTE — Telephone Encounter (Signed)
PLEASE CALL PT. HIS HEP C LEVELS ARE BARELY DETECTABLE. HE MAY CLEAR THE VIRUS WITHOUT TREATMENT. HE SHOULD HAVE ANOTHER HEP C LEVEL CHECKED IN FEB 2019.

## 2017-07-02 NOTE — Telephone Encounter (Signed)
DISCUSSED RESULTS WITH SOLSTAS AND ID: DR. Baxter Flattery.

## 2017-07-02 NOTE — Telephone Encounter (Signed)
Reminder in epic °

## 2017-07-03 NOTE — Telephone Encounter (Signed)
DISREGARD TC OCT 30 [id number]. PT HAS H PYLORI GASTRITIS BUT IS UNDERGOING THERAPY FOR HEP C. Called patient TO DISCUSS RESULTS. LVM: HE SHOULD COMPLETE HIS HARVONI AND THEN START ABX FOR H PYLORI. I APOLOGIZED FOR CONFUSION CAUSED BY MY PRIOR TC Jul 03 947.

## 2017-07-03 NOTE — Telephone Encounter (Signed)
See Updated note from Dr. Oneida Alar dated 07/03/2017.

## 2017-07-03 NOTE — Telephone Encounter (Signed)
Reminder in epic °

## 2017-07-03 NOTE — Telephone Encounter (Signed)
Noted  

## 2017-07-09 ENCOUNTER — Encounter (HOSPITAL_COMMUNITY)
Admission: RE | Admit: 2017-07-09 | Discharge: 2017-07-09 | Disposition: A | Discharge status: Home / Self Care | Payer: Medicare Other | Source: Ambulatory Visit | Attending: Gastroenterology | Admitting: Gastroenterology

## 2017-07-09 DIAGNOSIS — E279 Disorder of adrenal gland, unspecified: Secondary | ICD-10-CM | POA: Insufficient documentation

## 2017-07-09 DIAGNOSIS — E278 Other specified disorders of adrenal gland: Secondary | ICD-10-CM

## 2017-07-09 LAB — GLUCOSE, CAPILLARY: Glucose-Capillary: 79 mg/dL (ref 65–99)

## 2017-07-09 MED ORDER — FLUDEOXYGLUCOSE F - 18 (FDG) INJECTION
8.1300 | Freq: Once | INTRAVENOUS | Status: AC | PRN
  Administered 2017-07-09: 8.13 via INTRAVENOUS

## 2017-07-11 ENCOUNTER — Encounter: Payer: Self-pay | Admitting: Interventional Radiology

## 2017-07-11 ENCOUNTER — Other Ambulatory Visit (HOSPITAL_COMMUNITY): Payer: Self-pay | Admitting: Interventional Radiology

## 2017-07-11 DIAGNOSIS — C220 Liver cell carcinoma: Secondary | ICD-10-CM

## 2017-07-16 NOTE — Progress Notes (Signed)
Pt is aware.  

## 2017-07-16 NOTE — Telephone Encounter (Signed)
Pt said he has 20 more days of his Harvoni before he starts taking the antibiotics for H Pylori. He said he was told to take one half of Cardizem when he takes the Biaxin, but his Cardizem is a capsule. Please advise!

## 2017-07-16 NOTE — Progress Notes (Signed)
LMOM to call.

## 2017-07-17 NOTE — Telephone Encounter (Signed)
PLEASE CALL PT. HE SHOULD ASK WHOEVER TOLD HIM TO HALF HIS CARDIZEM.

## 2017-07-18 NOTE — Telephone Encounter (Signed)
The instructions for Cardizem was written on the instructions for the Biaxin that was sent to pharmacy.  Please advise!

## 2017-07-18 NOTE — Telephone Encounter (Signed)
LMOM to call.

## 2017-07-19 MED ORDER — DILTIAZEM HCL ER 90 MG PO CP12: ORAL_CAPSULE | ORAL | 0 refills | Status: DC

## 2017-07-19 NOTE — Telephone Encounter (Signed)
LMOM to call.

## 2017-07-19 NOTE — Addendum Note (Signed)
Addended by: Danie Binder on: 07/19/2017 10:40 AM   Modules accepted: Orders

## 2017-07-19 NOTE — Telephone Encounter (Signed)
PLEASE CALL PT. SENT PRESCRIPTION FOR HIM TO TAKE DILTIAZEM 90 MG DAILY WHEN HE STARTS TAKING THE BIAXIN & AMOXICILLIN.

## 2017-07-22 NOTE — Telephone Encounter (Signed)
Alejandro Rice is aware at the pharmacy.

## 2017-07-22 NOTE — Telephone Encounter (Signed)
LMOM to call.

## 2017-07-22 NOTE — Telephone Encounter (Signed)
T/C from [place] at [place] @ [phone]. Diltiazem 90 mg capsules are not available ( due to a manufacturing issue). They are recommended 90 mg tablets for the time pt will be taking the lower dose. Please advise!

## 2017-07-22 NOTE — Telephone Encounter (Signed)
Pt is aware.  

## 2017-07-22 NOTE — Telephone Encounter (Signed)
PLEASE CALL PT'S PHARMACY. OK TO SUBSTITUTE TABLETS FOR CAPSULES.

## 2017-08-07 ENCOUNTER — Telehealth: Payer: Self-pay | Admitting: Gastroenterology

## 2017-08-07 NOTE — Telephone Encounter (Signed)
Returned call and [facility] for a return call.

## 2017-08-07 NOTE — Telephone Encounter (Signed)
Pt has completed the Harvoni. He will begin the Amoxicillin and Biaxin for the H pylori. He is aware to take the Omeprazole bid while on the antibiotics. He is also aware to take the decreased dose ( 90 mg) of Cardizem while on these meds.

## 2017-08-07 NOTE — Telephone Encounter (Signed)
REVIEWED. AGREE.  PLEASE CALL PT. PT NEEDS A HEPATITS C VIRAL LOAD IN MAR 2019.

## 2017-08-07 NOTE — Telephone Encounter (Signed)
Pt wanted to speak to DS about his Harvoni and 2 antibiotics he's taking. Please call him back at [phone]

## 2017-08-08 ENCOUNTER — Other Ambulatory Visit: Payer: Self-pay

## 2017-08-08 DIAGNOSIS — B182 Chronic viral hepatitis C: Secondary | ICD-10-CM

## 2017-08-08 NOTE — Telephone Encounter (Signed)
Dr. Oneida Alar, how many weeks after pt completes Harvoni does he need the labs?  Will it be first, middle or end of March?  I am taking out the original order for 10/15/2017 after the correction was made. Please advise!

## 2017-08-08 NOTE — Telephone Encounter (Signed)
3 mos after his last dose HARVONI.

## 2017-08-08 NOTE — Telephone Encounter (Signed)
Pt is aware and lab order on file.

## 2017-08-14 ENCOUNTER — Telehealth: Payer: Self-pay | Admitting: Gastroenterology

## 2017-08-14 NOTE — Telephone Encounter (Signed)
Recall for hep c check in feb

## 2017-08-15 NOTE — Telephone Encounter (Signed)
Pt has lab order for 11/06/2017 for the [place].

## 2017-08-20 ENCOUNTER — Encounter: Payer: Self-pay | Admitting: Radiology

## 2017-09-05 ENCOUNTER — Encounter: Payer: Self-pay | Admitting: Gastroenterology

## 2017-09-26 ENCOUNTER — Encounter: Payer: Self-pay | Admitting: Gastroenterology

## 2017-10-11 ENCOUNTER — Other Ambulatory Visit: Payer: Self-pay | Admitting: *Deleted

## 2017-10-11 ENCOUNTER — Encounter: Payer: Self-pay | Admitting: *Deleted

## 2017-10-11 DIAGNOSIS — B182 Chronic viral hepatitis C: Secondary | ICD-10-CM

## 2017-11-07 ENCOUNTER — Other Ambulatory Visit: Payer: Self-pay

## 2017-11-07 ENCOUNTER — Telehealth: Payer: Self-pay | Admitting: Gastroenterology

## 2017-11-07 DIAGNOSIS — K7469 Other cirrhosis of liver: Secondary | ICD-10-CM

## 2017-11-07 LAB — HEPATITIS C RNA QUANTITATIVE
HCV Quantitative Log: <1.18 {Log_IU}/mL
HCV RNA, PCR, QN: <15 [IU]/mL

## 2017-11-07 NOTE — Telephone Encounter (Signed)
Lmom, waiting on a return call.  

## 2017-11-07 NOTE — Telephone Encounter (Signed)
Pt is to have RUQ U/S done one week prior to [place] in April. OV is 12/04/2017

## 2017-11-07 NOTE — Telephone Encounter (Signed)
[place] RUQ scheduled for 11/25/17 at 9:30am, arrive at 9:15am. NPO after midnight before test. Tried to call pt, no answer, LMOVM for return call. Appt letter mailed.

## 2017-11-07 NOTE — Telephone Encounter (Signed)
PLEASE CALL PT. HIS HEPATITIS C IS CURED. HE STILL NEEDS TO SEE A GI DOCTOR BECAUSE HE DID HAVE EVIDENCE OF CIRRHOSIS ON IMAGING. HE NEEDS A RUQ U/S EVERY 6 MOS. HIS NEXT ONE SHOULD BE ONE WEEK PRIOR TO HIS NEXT OPV IN APR 2019.

## 2017-11-07 NOTE — Telephone Encounter (Signed)
OV made and reminder in epic °

## 2017-11-12 NOTE — Telephone Encounter (Signed)
Pt is aware.  

## 2017-11-12 NOTE — Telephone Encounter (Signed)
LMOM to  Call.

## 2017-11-25 ENCOUNTER — Ambulatory Visit (HOSPITAL_COMMUNITY)
Admission: RE | Admit: 2017-11-25 | Discharge: 2017-11-25 | Disposition: A | Discharge status: Home / Self Care | Payer: Medicare HMO | Source: Ambulatory Visit | Attending: Gastroenterology | Admitting: Gastroenterology

## 2017-11-25 DIAGNOSIS — R932 Abnormal findings on diagnostic imaging of liver and biliary tract: Secondary | ICD-10-CM | POA: Diagnosis not present

## 2017-11-25 DIAGNOSIS — K7469 Other cirrhosis of liver: Secondary | ICD-10-CM | POA: Diagnosis present

## 2017-11-27 NOTE — Progress Notes (Signed)
LMOM to call.

## 2017-11-28 NOTE — Progress Notes (Signed)
LMOM and letter mailed with info.

## 2017-12-04 ENCOUNTER — Encounter: Payer: Self-pay | Admitting: Gastroenterology

## 2017-12-04 ENCOUNTER — Ambulatory Visit: Payer: Medicare Other | Admitting: Gastroenterology

## 2017-12-04 ENCOUNTER — Other Ambulatory Visit: Payer: Self-pay

## 2017-12-04 DIAGNOSIS — K2960 Other gastritis without bleeding: Secondary | ICD-10-CM

## 2017-12-04 DIAGNOSIS — C220 Liver cell carcinoma: Secondary | ICD-10-CM

## 2017-12-04 DIAGNOSIS — T39395A Adverse effect of other nonsteroidal anti-inflammatory drugs [NSAID], initial encounter: Secondary | ICD-10-CM

## 2017-12-04 DIAGNOSIS — K7030 Alcoholic cirrhosis of liver without ascites: Secondary | ICD-10-CM

## 2017-12-04 DIAGNOSIS — B182 Chronic viral hepatitis C: Secondary | ICD-10-CM | POA: Diagnosis not present

## 2017-12-04 NOTE — Assessment & Plan Note (Signed)
VL NEG IN OCT 2018.  CONTINUE TO MONITOR SYMPTOMS.

## 2017-12-04 NOTE — Assessment & Plan Note (Signed)
NO BRBPR OR MELENA. CONTINUES ON ASA PRODUCTS.  CONTINUE OMEPRAZOLE ESPECIALLY IF YOUR ARE TAKING ASPIRIN ON A REGULAR BASIS. FOLLOW UP IN NOV 2019.
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
## 2017-12-04 NOTE — Patient Instructions (Signed)
PA info for CT abd w/wo contrast submitted via [employer] website. Case approved. PA# [id number], 01/03/18-02/02/18.

## 2017-12-04 NOTE — Patient Instructions (Addendum)
COMPLETE CT IN MAY 2019 AND AN ULTRASOUND IN NOV 2019.  CONTINUE OMEPRAZOLE ESPECIALLY IF YOUR ARE TAKING ASPIRIN ON A REGULAR BASIS.  FOLLOW UP IN NOV 2019.

## 2017-12-04 NOTE — Assessment & Plan Note (Signed)
LAST IMAGING oct/nov 2018.  SPOKE TO DR. Reesa Chew. PT DOES NOT WNA TO TRAVEL TO [place]. CT W. AND W/O IV CONTRAST HEPATIC PROTOCOL IN MAY 2019. FOLLOW UP IN 6 MOS.

## 2017-12-04 NOTE — Assessment & Plan Note (Signed)
WELL COMPENSATED DISEASE. IMAING UP TO DATE.  U/S/OPV IN NOV 2019.

## 2017-12-04 NOTE — Progress Notes (Signed)
CC'D TO PCP °

## 2017-12-04 NOTE — Progress Notes (Signed)
ON RECALL  °

## 2018-01-03 ENCOUNTER — Encounter (HOSPITAL_COMMUNITY): Payer: Self-pay

## 2018-01-03 ENCOUNTER — Ambulatory Visit (HOSPITAL_COMMUNITY)
Admission: RE | Admit: 2018-01-03 | Discharge: 2018-01-03 | Disposition: A | Discharge status: Home / Self Care | Payer: Medicare HMO | Source: Ambulatory Visit | Attending: Gastroenterology | Admitting: Gastroenterology

## 2018-01-03 DIAGNOSIS — C220 Liver cell carcinoma: Secondary | ICD-10-CM | POA: Insufficient documentation

## 2018-01-03 DIAGNOSIS — E279 Disorder of adrenal gland, unspecified: Secondary | ICD-10-CM | POA: Diagnosis not present

## 2018-01-03 DIAGNOSIS — K7460 Unspecified cirrhosis of liver: Secondary | ICD-10-CM | POA: Insufficient documentation

## 2018-01-03 LAB — POCT I-STAT CREATININE: Creatinine, Ser: 0.9 mg/dL (ref 0.61–1.24)

## 2018-01-03 MED ORDER — IOPAMIDOL (ISOVUE-300) INJECTION 61%
100.0000 mL | Freq: Once | INTRAVENOUS | Status: AC | PRN
  Administered 2018-01-03: 100 mL via INTRAVENOUS

## 2018-01-09 ENCOUNTER — Telehealth: Payer: Self-pay | Admitting: Gastroenterology

## 2018-01-09 DIAGNOSIS — E278 Other specified disorders of adrenal gland: Secondary | ICD-10-CM

## 2018-01-09 NOTE — Progress Notes (Signed)
PT is aware and see other phone note. Dr. Oneida Alar spoke with Dr. Reesa Chew. OK to order the MRI and the phone note was routed to [place].

## 2018-01-09 NOTE — Telephone Encounter (Signed)
MRI scheduled for 01/15/18 at 4:00pm, pt to arrive at 3:30pm. NPO 4 hours prior to test. Called and informed pt. Letter mailed.  PA info for MRI submitted via [employer] website. Case approved. PA# [id number], 01/15/18-02/14/18.

## 2018-01-09 NOTE — Telephone Encounter (Signed)
PLEASE CALL PT. I PERSONALLY REVIEWED THE CT WITH DR. Reesa Chew. HE RECOMMENDED AN MRI TO LOOK AT THE ADRENAL MASS.

## 2018-01-09 NOTE — Progress Notes (Signed)
LMOM for a return call.  

## 2018-01-09 NOTE — Telephone Encounter (Signed)
PT is aware and OK to schedule MRI.  

## 2018-01-15 ENCOUNTER — Ambulatory Visit (HOSPITAL_COMMUNITY)
Admission: RE | Admit: 2018-01-15 | Discharge: 2018-01-15 | Disposition: A | Discharge status: Home / Self Care | Payer: Medicare HMO | Source: Ambulatory Visit | Attending: Gastroenterology | Admitting: Gastroenterology

## 2018-01-15 DIAGNOSIS — E279 Disorder of adrenal gland, unspecified: Secondary | ICD-10-CM | POA: Insufficient documentation

## 2018-01-15 DIAGNOSIS — E278 Other specified disorders of adrenal gland: Secondary | ICD-10-CM

## 2018-01-15 DIAGNOSIS — K7460 Unspecified cirrhosis of liver: Secondary | ICD-10-CM | POA: Insufficient documentation

## 2018-01-15 MED ORDER — GADOBENATE DIMEGLUMINE 529 MG/ML IV SOLN
15.0000 mL | Freq: Once | INTRAVENOUS | Status: AC | PRN
  Administered 2018-01-15: 15 mL via INTRAVENOUS

## 2018-01-16 NOTE — Progress Notes (Signed)
Randall Hiss, please review and advise. Dr. Oneida Alar will be back at the hospital tomorrow.

## 2018-01-20 NOTE — Progress Notes (Signed)
Dr. Reesa Chew, please review and advise pt of biopsy vs PET scan.

## 2018-01-21 ENCOUNTER — Telehealth: Payer: Self-pay | Admitting: Gastroenterology

## 2018-01-21 NOTE — Telephone Encounter (Signed)
PLEASE CALL PT. I Called DR. SHICK TO DISCUSS RESULTS WITH DR. Reesa Chew. HE IS VACATION. WILL BE BACK TUS MAY 28. I WILL DISCUSS WITH DR. SHICK WHEN HE RETURNS. THE MRI ALSO SHOWS CIRRHOSIS BUT NO LIVER CANCER.
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
## 2018-01-22 ENCOUNTER — Ambulatory Visit (INDEPENDENT_AMBULATORY_CARE_PROVIDER_SITE_OTHER): Payer: Medicare HMO | Admitting: Cardiovascular Disease

## 2018-01-22 VITALS — BP 122/70 | HR 81 | Ht 69.0 in | Wt 157.0 lb

## 2018-01-22 DIAGNOSIS — I480 Paroxysmal atrial fibrillation: Secondary | ICD-10-CM | POA: Diagnosis not present

## 2018-01-22 MED ORDER — ATENOLOL 50 MG PO TABS: 50.0000 mg | ORAL_TABLET | Freq: Two times a day (BID) | ORAL | 3 refills | Status: DC

## 2018-01-22 MED ORDER — ATENOLOL 50 MG PO TABS: 50.0000 mg | ORAL_TABLET | Freq: Every day | ORAL | 3 refills | Status: DC

## 2018-01-22 MED ORDER — DILTIAZEM HCL ER COATED BEADS 120 MG PO CP24: 120.0000 mg | ORAL_CAPSULE | Freq: Every day | ORAL | 3 refills | Status: DC

## 2018-01-22 NOTE — Patient Instructions (Addendum)
Medication Instructions:  Increase Diltiazem to 120 mg daily (afternoon)  Increase atenolol to 50 mg daily (morning)   Labwork: none  Testing/Procedures: none  Follow-Up: Your physician recommends that you schedule a follow-up appointment in:  Nurse visit in 3 weeks    Any Other Special Instructions Will Be Listed Below (If Applicable).     If you need a refill on your cardiac medications before your next appointment, please call your pharmacy.

## 2018-01-22 NOTE — Telephone Encounter (Signed)
PT is aware.

## 2018-01-30 ENCOUNTER — Other Ambulatory Visit: Payer: Self-pay | Admitting: *Deleted

## 2018-01-30 DIAGNOSIS — E278 Other specified disorders of adrenal gland: Secondary | ICD-10-CM

## 2018-01-31 ENCOUNTER — Telehealth: Payer: Self-pay | Admitting: Gastroenterology

## 2018-01-31 DIAGNOSIS — E278 Other specified disorders of adrenal gland: Secondary | ICD-10-CM

## 2018-01-31 NOTE — Telephone Encounter (Signed)
-----   Message from Roosvelt Maser sent at 01/31/2018 10:13 AM EDT ----- Regarding: FW: ATTN DR. SHICK-FW: CT BIOPSY Dr. Oneida Alar, See Dr. Fritz Pickerel note below  ----- Message ----- From: Greggory Keen, MD Sent: 01/31/2018  10:07 AM To: Roosvelt Maser Subject: RE: ATTN DR. SHICK-FW: CT BIOPSY               Agree we should get those labs to rule out pheochromocytoma first.  Then if neg get bx  Thanks trevor  ----- Message ----- From: Roosvelt Maser Sent: 01/31/2018   7:47 AM To: Greggory Keen, MD Subject: Loann Quill DR. SHICK-FW: CT BIOPSY                   Please see Dr. Oneida Alar message below Please advise as to what to do?  Thanks, Vivien Rota ----- Message ----- From: Danie Binder, MD Sent: 01/31/2018   7:32 AM To: Roosvelt Maser Subject: RE: CT BIOPSY                                  The pt is Dr. Trish Fountain pt. I discussed the case with Dr. Reesa Chew who asked me to order the CT BIOPSY. He did not ask for those labs. Please ask Dr. Reesa Chew if he would like those labs prior to [place].  ----- Message ----- From: Roosvelt Maser Sent: 01/31/2018   7:23 AM To: Danie Binder, MD Subject: FW: CT BIOPSY                                  Please see Dr. Katrinka Blazing recommendation for your patient. You can let me know when these are completed and I will resubmit for biopsy.  Thank you, Vivien Rota ----- Message ----- From: Jacqulynn Cadet, MD Sent: 01/30/2018   5:30 PM To: Roosvelt Maser Subject: RE: CT BIOPSY                                  Pt needs plasma and urine metanephrines before scheduling bx to exclude the possibility of pheochromocytoma.  If labs are normal, then approved for CT guided bx enlarging LEFT adrenal mass.  Hx HCC, previously treated.   HKM   ----- Message ----- From: Roosvelt Maser Sent: 01/30/2018   1:35 PM To: Ir Procedure Requests Subject: CT BIOPSY                                       Procedure:  Ct Biopsy  Reason:  Adrenal mass  Hx:  Mri in  computer  Dr.  Oneida Alar, Marga Melnick [phone]

## 2018-01-31 NOTE — Telephone Encounter (Signed)
Orders were placed by Dr. Oneida Alar. LMOM for pts son(emergency contact) to have pt call.

## 2018-01-31 NOTE — Telephone Encounter (Addendum)
PLEASE CONTACT PT DR SCHICK WANTS PLASMA AND A 24 HR URINE COLLECTION FOR A URINE TEST PRIOR TO CT BIOPSY. HE SHOULD COMPLETE THE LABS ASAP.

## 2018-01-31 NOTE — Telephone Encounter (Signed)
Lmom, waiting on a return call.  

## 2018-02-03 NOTE — Telephone Encounter (Signed)
Pt notified and is aware that he needs lab work done. Pt will have labs drawn with [facility] across from [facility]. Pt is also aware that after lab work is received, he will be updated with appointment info for the biopsy.

## 2018-02-03 NOTE — Telephone Encounter (Signed)
REVIEWED-NO ADDITIONAL RECOMMENDATIONS. 

## 2018-02-12 ENCOUNTER — Ambulatory Visit (INDEPENDENT_AMBULATORY_CARE_PROVIDER_SITE_OTHER): Payer: Medicare HMO | Admitting: *Deleted

## 2018-02-12 VITALS — HR 76

## 2018-02-12 DIAGNOSIS — I480 Paroxysmal atrial fibrillation: Secondary | ICD-10-CM | POA: Diagnosis not present

## 2018-02-12 NOTE — Progress Notes (Signed)
Presents today for recheck of heart rate due to recent medication changes at last office visit. Patient said that he has taken all doses of medications as prescribed without side effects notes. No c/o dizziness, chest pain or sob.

## 2018-02-12 NOTE — Patient Instructions (Signed)
Continue same plan and regimen.

## 2018-02-13 NOTE — Telephone Encounter (Signed)
PLEASE CALL PT. REMIND HIM TO COMPLETE LABS.

## 2018-02-13 NOTE — Telephone Encounter (Signed)
LMOVM. Does not look like he has had the lab work done yet. CT biopsy still not scheduled. FYI to [facility]

## 2018-02-14 NOTE — Telephone Encounter (Signed)
LMOVM for pt. Called patient son York Cerise. Made him aware we have been trying to get in contact with patient. We advised him about the lab work he needed to have done but it has not been done yet. He will see if he can get in contact with patient

## 2018-02-18 NOTE — Telephone Encounter (Signed)
Dr. Oneida Alar, we have called son and patient multiple times. Labs still have not been done.

## 2018-02-19 NOTE — Telephone Encounter (Signed)
REVIEWED-WE WILL WAIT FOR PT TO CONTACT us.

## 2018-03-17 NOTE — Telephone Encounter (Signed)
We still have not heard back from patient. Labs still have not been done.

## 2018-03-18 NOTE — Telephone Encounter (Addendum)
Letter has been sent. FYI to [facility] as patient has not completed labs either

## 2018-03-18 NOTE — Telephone Encounter (Signed)
Noted  

## 2018-03-18 NOTE — Telephone Encounter (Signed)
REVIEWED-send LETTER FOR PT TO CONTACT us.

## 2018-04-11 ENCOUNTER — Encounter: Payer: Self-pay | Admitting: Gastroenterology

## 2018-04-11 ENCOUNTER — Telehealth: Payer: Self-pay | Admitting: Gastroenterology

## 2018-04-11 NOTE — Telephone Encounter (Signed)
RECALL FOR ULTRASOUND 

## 2018-04-11 NOTE — Telephone Encounter (Signed)
Patient had CT abd 01/31/18 and MRI abd 01/16/18. Does patient still need u/s done? Also he has never contacted [place] back about having his lab work done for ct biopsy

## 2018-04-16 NOTE — Telephone Encounter (Signed)
NEXT U/S NOV 2019.PT MAY CONTACT us TO [facility].

## 2018-04-16 NOTE — Telephone Encounter (Signed)
Please NIC stacey thanks

## 2018-04-17 NOTE — Telephone Encounter (Signed)
ON RECALL  °

## 2018-06-02 ENCOUNTER — Other Ambulatory Visit: Payer: Self-pay | Admitting: Gastroenterology

## 2018-06-02 NOTE — Telephone Encounter (Signed)
Doris: when I try to sign this, it notes he is on Harvoni. Is he taking this?

## 2018-06-03 NOTE — Telephone Encounter (Signed)
Alejandro Rice, please see telephone note encounter on 07/02/2017 of HCV RNA. PT said at that time that he had 20 days left of Harvoni before he would start meds for H pylori.

## 2018-06-05 NOTE — Telephone Encounter (Signed)
LMOM for pt for clarification.

## 2018-06-12 ENCOUNTER — Telehealth: Payer: Self-pay | Admitting: Gastroenterology

## 2018-06-12 NOTE — Telephone Encounter (Signed)
RECALL FOR ABD ULTRASOUND 

## 2018-06-12 NOTE — Telephone Encounter (Signed)
Letter mailed

## 2018-09-22 ENCOUNTER — Emergency Department (HOSPITAL_COMMUNITY): Payer: Medicare HMO

## 2018-09-22 ENCOUNTER — Inpatient Hospital Stay (HOSPITAL_COMMUNITY)
Admission: EM | Admit: 2018-09-22 | Discharge: 2018-09-24 | DRG: 243 | Disposition: A | Discharge status: Home / Self Care | Payer: Medicare HMO | Attending: Family Medicine | Admitting: Family Medicine

## 2018-09-22 DIAGNOSIS — Z8619 Personal history of other infectious and parasitic diseases: Secondary | ICD-10-CM | POA: Diagnosis not present

## 2018-09-22 DIAGNOSIS — I4892 Unspecified atrial flutter: Secondary | ICD-10-CM | POA: Diagnosis not present

## 2018-09-22 DIAGNOSIS — R55 Syncope and collapse: Secondary | ICD-10-CM | POA: Diagnosis not present

## 2018-09-22 DIAGNOSIS — I482 Chronic atrial fibrillation, unspecified: Secondary | ICD-10-CM | POA: Diagnosis not present

## 2018-09-22 DIAGNOSIS — Z8249 Family history of ischemic heart disease and other diseases of the circulatory system: Secondary | ICD-10-CM | POA: Diagnosis not present

## 2018-09-22 DIAGNOSIS — K7030 Alcoholic cirrhosis of liver without ascites: Secondary | ICD-10-CM | POA: Diagnosis not present

## 2018-09-22 DIAGNOSIS — I4891 Unspecified atrial fibrillation: Secondary | ICD-10-CM | POA: Diagnosis present

## 2018-09-22 DIAGNOSIS — Z89512 Acquired absence of left leg below knee: Secondary | ICD-10-CM | POA: Diagnosis not present

## 2018-09-22 DIAGNOSIS — Z23 Encounter for immunization: Secondary | ICD-10-CM

## 2018-09-22 DIAGNOSIS — E279 Disorder of adrenal gland, unspecified: Secondary | ICD-10-CM | POA: Diagnosis present

## 2018-09-22 DIAGNOSIS — I495 Sick sinus syndrome: Secondary | ICD-10-CM | POA: Diagnosis not present

## 2018-09-22 DIAGNOSIS — I10 Essential (primary) hypertension: Secondary | ICD-10-CM | POA: Diagnosis present

## 2018-09-22 DIAGNOSIS — Z833 Family history of diabetes mellitus: Secondary | ICD-10-CM

## 2018-09-22 DIAGNOSIS — I272 Pulmonary hypertension, unspecified: Secondary | ICD-10-CM | POA: Diagnosis not present

## 2018-09-22 DIAGNOSIS — C220 Liver cell carcinoma: Secondary | ICD-10-CM | POA: Diagnosis present

## 2018-09-22 DIAGNOSIS — R231 Pallor: Secondary | ICD-10-CM

## 2018-09-22 DIAGNOSIS — I480 Paroxysmal atrial fibrillation: Secondary | ICD-10-CM | POA: Diagnosis present

## 2018-09-22 DIAGNOSIS — K7460 Unspecified cirrhosis of liver: Secondary | ICD-10-CM | POA: Diagnosis present

## 2018-09-22 DIAGNOSIS — E278 Other specified disorders of adrenal gland: Secondary | ICD-10-CM

## 2018-09-22 DIAGNOSIS — I739 Peripheral vascular disease, unspecified: Secondary | ICD-10-CM | POA: Diagnosis not present

## 2018-09-22 DIAGNOSIS — Z79899 Other long term (current) drug therapy: Secondary | ICD-10-CM

## 2018-09-22 DIAGNOSIS — Z8505 Personal history of malignant neoplasm of liver: Secondary | ICD-10-CM

## 2018-09-22 DIAGNOSIS — D7589 Other specified diseases of blood and blood-forming organs: Secondary | ICD-10-CM | POA: Diagnosis not present

## 2018-09-22 DIAGNOSIS — Z716 Tobacco abuse counseling: Secondary | ICD-10-CM

## 2018-09-22 DIAGNOSIS — Z7982 Long term (current) use of aspirin: Secondary | ICD-10-CM | POA: Diagnosis not present

## 2018-09-22 DIAGNOSIS — Z950 Presence of cardiac pacemaker: Secondary | ICD-10-CM

## 2018-09-22 DIAGNOSIS — Z720 Tobacco use: Secondary | ICD-10-CM

## 2018-09-22 DIAGNOSIS — I455 Other specified heart block: Secondary | ICD-10-CM | POA: Diagnosis not present

## 2018-09-22 DIAGNOSIS — F17210 Nicotine dependence, cigarettes, uncomplicated: Secondary | ICD-10-CM | POA: Diagnosis present

## 2018-09-22 LAB — COMPREHENSIVE METABOLIC PANEL
ALT: 30 U/L (ref 0–44)
AST: 26 U/L (ref 15–41)
Albumin: 3.7 g/dL (ref 3.5–5.0)
Alkaline Phosphatase: 77 U/L (ref 38–126)
Anion gap: 10 (ref 5–15)
BUN: 17 mg/dL (ref 8–23)
CALCIUM: 9.1 mg/dL (ref 8.9–10.3)
CO2: 23 mmol/L (ref 22–32)
Chloride: 103 mmol/L (ref 98–111)
Creatinine, Ser: 0.84 mg/dL (ref 0.61–1.24)
GFR calc non Af Amer: >60 mL/min (ref >60)
Glucose, Bld: 104 mg/dL — ABNORMAL HIGH (ref 70–99)
Potassium: 4 mmol/L (ref 3.5–5.1)
Sodium: 136 mmol/L (ref 135–145)
Total Bilirubin: 0.7 mg/dL (ref 0.3–1.2)
Total Protein: 7.6 g/dL (ref 6.5–8.1)

## 2018-09-22 LAB — CBC WITH DIFFERENTIAL/PLATELET
Abs Immature Granulocytes: 0.02 10*3/uL (ref 0.00–0.07)
Basophils Absolute: 0.1 10*3/uL (ref 0.0–0.1)
Basophils Relative: 1 %
Eosinophils Absolute: 0.1 10*3/uL (ref 0.0–0.5)
Eosinophils Relative: 1 %
HCT: 51.4 % (ref 39.0–52.0)
Hemoglobin: 16.8 g/dL (ref 13.0–17.0)
Immature Granulocytes: 0 %
Lymphocytes Relative: 21 %
Lymphs Abs: 1.7 10*3/uL (ref 0.7–4.0)
MCH: 35.4 pg — AB (ref 26.0–34.0)
MCHC: 32.7 g/dL (ref 30.0–36.0)
MCV: 108.2 fL — ABNORMAL HIGH (ref 80.0–100.0)
MONO ABS: 0.9 10*3/uL (ref 0.1–1.0)
Monocytes Relative: 12 %
Neutro Abs: 5.2 10*3/uL (ref 1.7–7.7)
Neutrophils Relative %: 65 %
Platelets: 203 10*3/uL (ref 150–400)
RBC: 4.75 MIL/uL (ref 4.22–5.81)
RDW: 13.1 % (ref 11.5–15.5)
WBC: 8 10*3/uL (ref 4.0–10.5)
nRBC: 0 % (ref 0.0–0.2)

## 2018-09-22 LAB — PROTIME-INR
INR: 1.26
PROTHROMBIN TIME: 15.7 s — AB (ref 11.4–15.2)

## 2018-09-22 LAB — TROPONIN I: Troponin I: <0.03 ng/mL (ref <0.03)

## 2018-09-22 MED ORDER — SODIUM CHLORIDE 0.9 % IV BOLUS
500.0000 mL | Freq: Once | INTRAVENOUS | Status: DC
  Administered 2018-09-22: 500 mL via INTRAVENOUS

## 2018-09-22 MED ORDER — DILTIAZEM HCL 25 MG/5ML IV SOLN
10.0000 mg | Freq: Once | INTRAVENOUS | Status: AC
  Administered 2018-09-22: 10 mg via INTRAVENOUS
  Filled 2018-09-22: qty 5

## 2018-09-22 MED ORDER — HYDROCODONE-ACETAMINOPHEN 5-325 MG PO TABS
1.0000 | ORAL_TABLET | Freq: Once | ORAL | Status: AC
  Administered 2018-09-22: 1 via ORAL
  Filled 2018-09-22: qty 1

## 2018-09-22 MED ORDER — SODIUM CHLORIDE 0.9 % IV BOLUS
1000.0000 mL | Freq: Once | INTRAVENOUS | Status: AC
  Administered 2018-09-22: 1000 mL via INTRAVENOUS

## 2018-09-22 MED ORDER — DILTIAZEM HCL 100 MG IV SOLR
5.0000 mg/h | INTRAVENOUS | Status: DC
  Administered 2018-09-22: 5 mg/h via INTRAVENOUS
  Filled 2018-09-22: qty 100

## 2018-09-22 MED ORDER — SODIUM CHLORIDE 0.9 % IV BOLUS: 500.0000 mL | Freq: Once | INTRAVENOUS | Status: DC

## 2018-09-22 MED ORDER — HYDROCODONE-ACETAMINOPHEN 5-325 MG PO TABS
1.0000 | ORAL_TABLET | ORAL | Status: DC | PRN
  Administered 2018-09-23: 1 via ORAL
  Filled 2018-09-22: qty 1

## 2018-09-22 MED ORDER — IOPAMIDOL (ISOVUE-370) INJECTION 76%
150.0000 mL | Freq: Once | INTRAVENOUS | Status: AC | PRN
  Administered 2018-09-22: 150 mL via INTRAVENOUS

## 2018-09-22 MED ORDER — DILTIAZEM HCL-DEXTROSE 100-5 MG/100ML-% IV SOLN (PREMIX)
5.0000 mg/h | INTRAVENOUS | Status: DC
  Administered 2018-09-23: 5 mg/h via INTRAVENOUS
  Filled 2018-09-22: qty 100
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
## 2018-09-22 NOTE — ED Notes (Signed)
Foot is cold to touch with poor cap refill. Unable to hear pulse with [place]

## 2018-09-22 NOTE — ED Notes (Signed)
Report to [facility] 

## 2018-09-22 NOTE — ED Triage Notes (Signed)
Patient has right foot pain for 2 days, denies any falls or injuries.  Pain has turned numb when walking.

## 2018-09-22 NOTE — ED Notes (Signed)
Attempted call to hospitalist at [phone] to notify Cardizem drip stopped and report BP/HR, no answer, chat message sent via Epic

## 2018-09-23 ENCOUNTER — Other Ambulatory Visit: Payer: Self-pay

## 2018-09-23 ENCOUNTER — Telehealth: Payer: Self-pay | Admitting: Vascular Surgery

## 2018-09-23 ENCOUNTER — Observation Stay (HOSPITAL_BASED_OUTPATIENT_CLINIC_OR_DEPARTMENT_OTHER): Payer: Medicare HMO

## 2018-09-23 ENCOUNTER — Encounter (HOSPITAL_COMMUNITY)
Admission: EM | Disposition: A | Discharge status: Home / Self Care | Payer: Self-pay | Source: Home / Self Care | Attending: Internal Medicine

## 2018-09-23 ENCOUNTER — Observation Stay (HOSPITAL_COMMUNITY): Payer: Medicare HMO

## 2018-09-23 DIAGNOSIS — Z79899 Other long term (current) drug therapy: Secondary | ICD-10-CM | POA: Diagnosis not present

## 2018-09-23 DIAGNOSIS — I482 Chronic atrial fibrillation, unspecified: Secondary | ICD-10-CM | POA: Diagnosis present

## 2018-09-23 DIAGNOSIS — I455 Other specified heart block: Secondary | ICD-10-CM | POA: Diagnosis not present

## 2018-09-23 DIAGNOSIS — Z7982 Long term (current) use of aspirin: Secondary | ICD-10-CM | POA: Diagnosis not present

## 2018-09-23 DIAGNOSIS — E279 Disorder of adrenal gland, unspecified: Secondary | ICD-10-CM | POA: Diagnosis present

## 2018-09-23 DIAGNOSIS — I361 Nonrheumatic tricuspid (valve) insufficiency: Secondary | ICD-10-CM | POA: Diagnosis not present

## 2018-09-23 DIAGNOSIS — R55 Syncope and collapse: Secondary | ICD-10-CM | POA: Diagnosis not present

## 2018-09-23 DIAGNOSIS — Z833 Family history of diabetes mellitus: Secondary | ICD-10-CM | POA: Diagnosis not present

## 2018-09-23 DIAGNOSIS — D7589 Other specified diseases of blood and blood-forming organs: Secondary | ICD-10-CM | POA: Diagnosis present

## 2018-09-23 DIAGNOSIS — Z89512 Acquired absence of left leg below knee: Secondary | ICD-10-CM | POA: Diagnosis not present

## 2018-09-23 DIAGNOSIS — I739 Peripheral vascular disease, unspecified: Secondary | ICD-10-CM | POA: Diagnosis present

## 2018-09-23 DIAGNOSIS — I480 Paroxysmal atrial fibrillation: Secondary | ICD-10-CM | POA: Diagnosis present

## 2018-09-23 DIAGNOSIS — F17210 Nicotine dependence, cigarettes, uncomplicated: Secondary | ICD-10-CM | POA: Diagnosis present

## 2018-09-23 DIAGNOSIS — R200 Anesthesia of skin: Secondary | ICD-10-CM

## 2018-09-23 DIAGNOSIS — M79671 Pain in right foot: Secondary | ICD-10-CM

## 2018-09-23 DIAGNOSIS — I4892 Unspecified atrial flutter: Secondary | ICD-10-CM | POA: Diagnosis present

## 2018-09-23 DIAGNOSIS — Z8619 Personal history of other infectious and parasitic diseases: Secondary | ICD-10-CM | POA: Diagnosis not present

## 2018-09-23 DIAGNOSIS — I4891 Unspecified atrial fibrillation: Secondary | ICD-10-CM | POA: Diagnosis present

## 2018-09-23 DIAGNOSIS — I272 Pulmonary hypertension, unspecified: Secondary | ICD-10-CM | POA: Diagnosis present

## 2018-09-23 DIAGNOSIS — K7460 Unspecified cirrhosis of liver: Secondary | ICD-10-CM | POA: Diagnosis present

## 2018-09-23 DIAGNOSIS — K7030 Alcoholic cirrhosis of liver without ascites: Secondary | ICD-10-CM | POA: Diagnosis not present

## 2018-09-23 DIAGNOSIS — C220 Liver cell carcinoma: Secondary | ICD-10-CM

## 2018-09-23 DIAGNOSIS — Z23 Encounter for immunization: Secondary | ICD-10-CM | POA: Diagnosis present

## 2018-09-23 DIAGNOSIS — Z716 Tobacco abuse counseling: Secondary | ICD-10-CM | POA: Diagnosis not present

## 2018-09-23 DIAGNOSIS — I10 Essential (primary) hypertension: Secondary | ICD-10-CM | POA: Diagnosis present

## 2018-09-23 DIAGNOSIS — Z8249 Family history of ischemic heart disease and other diseases of the circulatory system: Secondary | ICD-10-CM | POA: Diagnosis not present

## 2018-09-23 DIAGNOSIS — Z8505 Personal history of malignant neoplasm of liver: Secondary | ICD-10-CM | POA: Diagnosis not present

## 2018-09-23 DIAGNOSIS — I495 Sick sinus syndrome: Secondary | ICD-10-CM | POA: Diagnosis present

## 2018-09-23 HISTORY — PX: PACEMAKER IMPLANT: EP1218

## 2018-09-23 LAB — CBC
HCT: 46.3 % (ref 39.0–52.0)
Hemoglobin: 15.3 g/dL (ref 13.0–17.0)
MCH: 34.9 pg — ABNORMAL HIGH (ref 26.0–34.0)
MCHC: 33 g/dL (ref 30.0–36.0)
MCV: 105.7 fL — ABNORMAL HIGH (ref 80.0–100.0)
Platelets: 178 10*3/uL (ref 150–400)
RBC: 4.38 MIL/uL (ref 4.22–5.81)
RDW: 12.9 % (ref 11.5–15.5)
WBC: 8.2 10*3/uL (ref 4.0–10.5)
nRBC: 0 % (ref 0.0–0.2)

## 2018-09-23 LAB — ECHOCARDIOGRAM COMPLETE
Height: 69 in
Weight: 2419.77 oz

## 2018-09-23 LAB — SURGICAL PCR SCREEN
MRSA, PCR: NEGATIVE
Staphylococcus aureus: NEGATIVE

## 2018-09-23 LAB — BASIC METABOLIC PANEL
Anion gap: 9 (ref 5–15)
BUN: 13 mg/dL (ref 8–23)
CO2: 23 mmol/L (ref 22–32)
Calcium: 8.7 mg/dL — ABNORMAL LOW (ref 8.9–10.3)
Chloride: 103 mmol/L (ref 98–111)
Creatinine, Ser: 0.89 mg/dL (ref 0.61–1.24)
GFR calc Af Amer: >60 mL/min (ref >60)
GFR calc non Af Amer: >60 mL/min (ref >60)
Glucose, Bld: 98 mg/dL (ref 70–99)
Potassium: 5 mmol/L (ref 3.5–5.1)
Sodium: 135 mmol/L (ref 135–145)

## 2018-09-23 LAB — TROPONIN I
Troponin I: <0.03 ng/mL (ref <0.03)
Troponin I: <0.03 ng/mL (ref <0.03)
Troponin I: <0.03 ng/mL (ref <0.03)

## 2018-09-23 LAB — LACTIC ACID, PLASMA: Lactic Acid, Venous: 1.3 mmol/L (ref 0.5–1.9)

## 2018-09-23 LAB — MRSA PCR SCREENING: MRSA by PCR: NEGATIVE

## 2018-09-23 LAB — HIV ANTIBODY (ROUTINE TESTING W REFLEX): HIV Screen 4th Generation wRfx: NONREACTIVE

## 2018-09-23 SURGERY — PACEMAKER IMPLANT

## 2018-09-23 MED ORDER — VITAMIN D 25 MCG (1000 UNIT) PO TABS
2000.0000 [IU] | ORAL_TABLET | Freq: Every day | ORAL | Status: DC
  Administered 2018-09-23 – 2018-09-24 (×2): 2000 [IU] via ORAL
  Filled 2018-09-23 (×3): qty 2

## 2018-09-23 MED ORDER — PERMETHRIN 1 % EX LOTN
TOPICAL_LOTION | Freq: Once | CUTANEOUS | Status: DC
  Filled 2018-09-23: qty 59

## 2018-09-23 MED ORDER — HEPARIN (PORCINE) IN NACL 1000-0.9 UT/500ML-% IV SOLN
INTRAVENOUS | Status: DC | PRN
  Administered 2018-09-23: 500 mL

## 2018-09-23 MED ORDER — ATENOLOL 50 MG PO TABS: 50.0000 mg | ORAL_TABLET | Freq: Every day | ORAL | Status: DC

## 2018-09-23 MED ORDER — LIDOCAINE HCL (PF) 1 % IJ SOLN
INTRAMUSCULAR | Status: AC
  Filled 2018-09-23: qty 60

## 2018-09-23 MED ORDER — ACETAMINOPHEN 325 MG PO TABS: 325.0000 mg | ORAL_TABLET | ORAL | Status: DC | PRN

## 2018-09-23 MED ORDER — ONDANSETRON HCL 4 MG/2ML IJ SOLN: 4.0000 mg | Freq: Four times a day (QID) | INTRAMUSCULAR | Status: DC | PRN

## 2018-09-23 MED ORDER — CEFAZOLIN SODIUM-DEXTROSE 2-4 GM/100ML-% IV SOLN
INTRAVENOUS | Status: AC
  Filled 2018-09-23: qty 100

## 2018-09-23 MED ORDER — SODIUM CHLORIDE 0.9 % IV SOLN: 250.0000 mL | INTRAVENOUS | Status: DC

## 2018-09-23 MED ORDER — ACETAMINOPHEN 325 MG PO TABS
650.0000 mg | ORAL_TABLET | ORAL | Status: DC | PRN
  Administered 2018-09-23: 650 mg via ORAL
  Filled 2018-09-23: qty 2

## 2018-09-23 MED ORDER — HEPARIN (PORCINE) IN NACL 1000-0.9 UT/500ML-% IV SOLN
INTRAVENOUS | Status: AC
  Filled 2018-09-23: qty 500

## 2018-09-23 MED ORDER — SODIUM CHLORIDE 0.9% FLUSH
3.0000 mL | Freq: Two times a day (BID) | INTRAVENOUS | Status: DC
  Administered 2018-09-23: 10 mL via INTRAVENOUS

## 2018-09-23 MED ORDER — LIDOCAINE HCL (PF) 1 % IJ SOLN
INTRAMUSCULAR | Status: DC | PRN
  Administered 2018-09-23: 45 mL

## 2018-09-23 MED ORDER — ALBUTEROL SULFATE (2.5 MG/3ML) 0.083% IN NEBU: 3.0000 mL | INHALATION_SOLUTION | RESPIRATORY_TRACT | Status: DC | PRN

## 2018-09-23 MED ORDER — SODIUM CHLORIDE 0.9 % IV SOLN
INTRAVENOUS | Status: AC | PRN
  Administered 2018-09-23: 100 mL via INTRAVENOUS

## 2018-09-23 MED ORDER — ENOXAPARIN SODIUM 80 MG/0.8ML ~~LOC~~ SOLN
1.0000 mg/kg | Freq: Two times a day (BID) | SUBCUTANEOUS | Status: DC
  Administered 2018-09-23: 70 mg via SUBCUTANEOUS
  Filled 2018-09-23 (×2): qty 0.7

## 2018-09-23 MED ORDER — MONTELUKAST SODIUM 10 MG PO TABS
10.0000 mg | ORAL_TABLET | Freq: Every day | ORAL | Status: DC
  Administered 2018-09-23 – 2018-09-24 (×2): 10 mg via ORAL
  Filled 2018-09-23 (×2): qty 1

## 2018-09-23 MED ORDER — DILTIAZEM HCL ER COATED BEADS 120 MG PO CP24: 120.0000 mg | ORAL_CAPSULE | Freq: Every day | ORAL | Status: DC

## 2018-09-23 MED ORDER — HYDROCODONE-ACETAMINOPHEN 5-325 MG PO TABS
1.0000 | ORAL_TABLET | ORAL | Status: DC | PRN
  Administered 2018-09-24: 2 via ORAL
  Administered 2018-09-24: 1 via ORAL
  Filled 2018-09-23: qty 1
  Filled 2018-09-23: qty 2

## 2018-09-23 MED ORDER — CEFAZOLIN SODIUM-DEXTROSE 1-4 GM/50ML-% IV SOLN
1.0000 g | Freq: Four times a day (QID) | INTRAVENOUS | Status: AC
  Administered 2018-09-23 – 2018-09-24 (×3): 1 g via INTRAVENOUS
  Filled 2018-09-23 (×3): qty 50

## 2018-09-23 MED ORDER — CHLORHEXIDINE GLUCONATE 4 % EX LIQD
60.0000 mL | Freq: Once | CUTANEOUS | Status: AC
  Filled 2018-09-23: qty 60

## 2018-09-23 MED ORDER — HYDROXYZINE HCL 25 MG PO TABS: 50.0000 mg | ORAL_TABLET | Freq: Every evening | ORAL | Status: DC | PRN

## 2018-09-23 MED ORDER — SODIUM CHLORIDE 0.9% FLUSH: 3.0000 mL | INTRAVENOUS | Status: DC | PRN

## 2018-09-23 MED ORDER — PANTOPRAZOLE SODIUM 40 MG PO TBEC
40.0000 mg | DELAYED_RELEASE_TABLET | Freq: Every day | ORAL | Status: DC
  Administered 2018-09-23 – 2018-09-24 (×2): 40 mg via ORAL
  Filled 2018-09-23 (×2): qty 1

## 2018-09-23 MED ORDER — CHLORHEXIDINE GLUCONATE 4 % EX LIQD
60.0000 mL | Freq: Once | CUTANEOUS | Status: AC
  Administered 2018-09-23: 4 via TOPICAL

## 2018-09-23 MED ORDER — IOPAMIDOL (ISOVUE-370) INJECTION 76%
INTRAVENOUS | Status: AC
  Filled 2018-09-23: qty 50

## 2018-09-23 MED ORDER — SODIUM CHLORIDE 0.9 % IV SOLN
80.0000 mg | INTRAVENOUS | Status: AC
  Administered 2018-09-23: 80 mg

## 2018-09-23 MED ORDER — FENTANYL CITRATE (PF) 100 MCG/2ML IJ SOLN
INTRAMUSCULAR | Status: AC
  Filled 2018-09-23: qty 2

## 2018-09-23 MED ORDER — IOPAMIDOL (ISOVUE-370) INJECTION 76%
INTRAVENOUS | Status: DC | PRN
  Administered 2018-09-23: 10 mL via INTRAVENOUS

## 2018-09-23 MED ORDER — FOLIC ACID 1 MG PO TABS
1.0000 mg | ORAL_TABLET | Freq: Every day | ORAL | Status: DC
  Administered 2018-09-23 – 2018-09-24 (×2): 1 mg via ORAL
  Filled 2018-09-23 (×2): qty 1

## 2018-09-23 MED ORDER — PERFLUTREN LIPID MICROSPHERE
1.0000 mL | INTRAVENOUS | Status: AC | PRN
  Administered 2018-09-23: 3 mL via INTRAVENOUS
  Filled 2018-09-23: qty 10

## 2018-09-23 MED ORDER — MIDAZOLAM HCL 5 MG/5ML IJ SOLN
INTRAMUSCULAR | Status: AC
  Filled 2018-09-23: qty 5

## 2018-09-23 MED ORDER — ACETAMINOPHEN 500 MG PO TABS: 500.0000 mg | ORAL_TABLET | ORAL | Status: DC | PRN

## 2018-09-23 MED ORDER — ADULT MULTIVITAMIN W/MINERALS CH
1.0000 | ORAL_TABLET | Freq: Every day | ORAL | Status: DC
  Administered 2018-09-23 – 2018-09-24 (×2): 1 via ORAL
  Filled 2018-09-23 (×2): qty 1

## 2018-09-23 MED ORDER — SODIUM CHLORIDE 0.9 % IV SOLN
INTRAVENOUS | Status: AC
  Filled 2018-09-23: qty 2

## 2018-09-23 MED ORDER — CEFAZOLIN SODIUM-DEXTROSE 2-4 GM/100ML-% IV SOLN
2.0000 g | INTRAVENOUS | Status: AC
  Administered 2018-09-23: 2 g via INTRAVENOUS
  Filled 2018-09-23: qty 100

## 2018-09-23 MED ORDER — ASPIRIN EC 81 MG PO TBEC
81.0000 mg | DELAYED_RELEASE_TABLET | Freq: Every day | ORAL | Status: DC
  Administered 2018-09-23 – 2018-09-24 (×2): 81 mg via ORAL
  Filled 2018-09-23 (×2): qty 1

## 2018-09-23 MED ORDER — SODIUM CHLORIDE 0.9 % IV SOLN
INTRAVENOUS | Status: DC
  Administered 2018-09-23: 13:00:00 via INTRAVENOUS

## 2018-09-23 SURGICAL SUPPLY — 8 items
CABLE SURGICAL S-101-97-12 (CABLE) ×3 IMPLANT
GUIDEWIRE ANGLED .035X150CM (WIRE) ×3 IMPLANT
LEAD TENDRIL MRI 52CM LPA1200M (Lead) ×3 IMPLANT
LEAD TENDRIL MRI 58CM LPA1200M (Lead) ×3 IMPLANT
PACEMAKER ASSURITY DR-RF (Pacemaker) ×3 IMPLANT
PAD PRO RADIOLUCENT 2001M-C (PAD) ×3 IMPLANT
SHEATH CLASSIC 8F (SHEATH) ×6 IMPLANT
TRAY PACEMAKER INSERTION (PACKS) ×3 IMPLANT

## 2018-09-23 NOTE — Progress Notes (Signed)
Called from [facility] ER last evening regarding this pt with numbness coolness subjectively in foot.  CTA done last night shows no significant reconstructable arterial occlusive disease. Diminutive flow suggestive of small vessel disease could just as easily be explained by contrasted timing or wash out. Will order ABIs this morning.  No indication for vascular surgical intervention.  Full consult later today.  Ruta Hinds, MD [facility] Office: [phone] Pager: [phone]
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
## 2018-09-23 NOTE — Progress Notes (Signed)
  Echocardiogram 2D Echocardiogram has been performed.  Alejandro Rice 09/23/2018, 10:16 AM

## 2018-09-23 NOTE — Plan of Care (Signed)
°  Problem: Coping: °Goal: Level of anxiety will decrease °Outcome: Progressing °  °

## 2018-09-23 NOTE — Progress Notes (Signed)
Pt has arrived on unit. VS stable. MD notified of pts arrival. Will continue to monitor.

## 2018-09-23 NOTE — Progress Notes (Signed)
ABI's have been completed. Preliminary results can be found in CV Proc through chart review.   09/23/18 11:30 AM Alejandro Rice RVT
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
## 2018-09-23 NOTE — Telephone Encounter (Signed)
-----   Message from Mena Goes, RN sent at 09/23/2018  2:55 PM EST ----- Regarding: 6 months appt with ABIs  ----- Message ----- From: Elam Dutch, MD Sent: 09/23/2018   1:17 PM EST To: Vvs Charge Pool  Level 4 consult for right foot numbness  Needs follow up in PA or NP clinic with ABIs in 6 months  Does not need to go on the list  Ruta Hinds

## 2018-09-23 NOTE — Telephone Encounter (Signed)
sch appt vm full mld ltr 7/23 2020 10am ABI 1045am f/u NP

## 2018-09-23 NOTE — Progress Notes (Signed)
Shift event note: Notified by bedside RN regarding pt having an episode of  brief loss of conscience during a 10.5 second pause noted on telemetry. Pt awoke spontaneously and was noted to be alert and oriented. He denied pain or other symptoms. RN reports that [facility] RN reported that pt had a similar episode before leaving [facility] for transfer to [facility]. At bedside pt noted resting quietly in NAD. He is awake and oriented x 3.  He admits to chronic a-fib but has never had a similar episode as far as he is aware of. Pt is aware of previous episode prior to transport. Pt currently remains in A-Fib/RVR w/ HR in the 140's. Cardizem drip remains off. There is no facial droop or slurred speech. Grips are strong and equal. CN II-XII appear grossly intact. BBS CTA. During exam pt converted to SB w/ HR in the 50's. BP somewhat soft w/ SBP in the 80's.   Assessment/Plan: 1. 10.5 second pause w/ associated LOC: Cardizem drip discontinued. PO Cardizem and Atenolol d/c'd for now. Dr Marletta Lor w/ cardiology service consulted and agreed to evaluate pt. She also reviewed EKG obtained after conversion to SB. Pacer pads placed on pt and RN instructed to keep defibrillator at bedside. Pt will be transferred to ICU per cardiology recommendation. Discussed w/ Dr Emmit Alexanders w/ [facility] who has approved transfer to ICU. Will cycle troponin's and obtain cbc, bmet and lactate. Will order echo for am. Cardiology and EP to follow in am. Pt to be monitored closely in ICU.   Jeryl Columbia, NP-C [facility] Pager [phone]  CRITICAL CARE Performed by: Jeryl Columbia   Total critical care time: 120 minutes  Critical care time was exclusive of separately billable procedures and treating other patients.  Critical care was necessary to treat or prevent imminent or life-threatening deterioration.  Critical care was time spent personally by me on the following activities: development of treatment plan with patient and/or surrogate  as well as nursing, discussions with consultants, evaluation of patient's response to treatment, examination of patient, obtaining history from patient or surrogate, ordering and performing treatments and interventions, ordering and review of laboratory studies, ordering and review of radiographic studies, pulse oximetry and re-evaluation of patient's condition.

## 2018-09-23 NOTE — Progress Notes (Signed)
Paged MD to assess pt after pt briefly went unresponsive with RN at bedside. Cardizem was stopped.  MD assessed pt and notified Cardiology of pts condition. Cardiology requested pt be transferred to ICU. Pt currently stable awaiting transfer. Will continue to monitor.

## 2018-09-23 NOTE — Progress Notes (Addendum)
The patient informed the RN that he would like more time to consider PPM implant.  He is not ready to proceed today.  I have taken him off the schedule, will resume diet.  Tommye Standard, PA-C  RN called, patient has decided to go ahead with PPM implant.  I visited with the patient, he has no follow up questions about procedure, just needed more time to get it settled in his mind.  He would like to proceed with PPM   Tommye Standard, PA-C  Mikle Bosworth.D.

## 2018-09-23 NOTE — Progress Notes (Signed)
Patient prepped for permanent pacemaker implant with hibiclens. New gown applied. Sacral foam applied. Left arm IV access established. Consent signed.  Bo Merino RN

## 2018-09-23 NOTE — Progress Notes (Signed)
Did thorough check of pt's scalp, beard, chest, and belongings. No bugs/lice/fleas/nits of any kind visualized on assessment. Verified with second RN Monna Fam. Pt denies itching.
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
## 2018-09-23 NOTE — Progress Notes (Signed)
Received pt from cath lab. Instructed the pt to limit movement with left upper extremity for 24 hours.

## 2018-09-23 NOTE — Progress Notes (Signed)
[place] TEAM 1 - Stepdown/ICU LONG BRIMAGE  QQV:[id number] DOB: May 05, 1956 DOA: 09/22/2018 PCP: Rogers Blocker, MD    Brief Narrative:  63 y.o. male w/ a hx of A. Fib (on ASA only), tobacco abuse, treated Hep C, hepatocellular carcinoma, and hepatic cirrhosis who presented with abdominal pain and a cold right foot.    In the ED a CTabd/pelvis did not show any acute occlusions. He was found to be in A. fib with a heart rate of 140.    Significant Events: 1/20 admit   Subjective: The patient initially tells me he is anxious to get home to "take care of his animals."  When I discussed with him the serious nature of his extended pauses and the need for pacemaker and the fact that he could die at home he tells me he is willing to wait and have his pacemaker implanted.  He reports some modest pain in his right foot but overall states he feels much better.  He denies chest pain or abdominal pain at this time.  He tells me he was unable to keep his previously scheduled appointment for a CT-guided adrenal mass biopsy due to multiple deaths in his family and friend group at the time.  He is now willing to have this procedure completed.  Assessment & Plan:  Cold right foot -?  Embolic event No evidence of signif vessel disease on CTangio - for ABIs - Vascular Surgery has seen - ? embolic event related to A. fib without anticoagulation  Chronic A. fib with acute RVR Appears it would be in the patient's best interest to initiate anticoagulation -the timing of this will be tricky however given that the patient needs a pacemaker and also needs a biopsy of an adrenal mass -we will not initiate anything at this time -follow on telemetry  Postconversion pause with syncope 10.5 seconds on telemetry without ventricular escape -for pacemaker per EP with patient now telling me he is willing to proceed  Enlarging left adrenal mass  Now 3.0 x 2.7 - has grown from 2.4cm in May 2019 - chart review  notes Dr. Oneida Alar (GI in [place]) scheduled the pt for a CT guided bx, but he was a no-show -now agrees to biopsy but timing will will have to take into account the need for anticoagulation as well as pacemaker placement -this could be pursued as an outpatient in close follow-up  Hepatic cirrhosis with history of hepatocellular carcinoma and hepatitis C Followed by Dr. Oneida Alar in [facility] -appears stable at this time  Tobacco abuse Counseled on absolute need to discontinue tobacco abuse  HTN Blood pressure currently well controlled  Macrocytosis without anemia Check P32 and folic acid levels  Treatment planning The patient needs the following interventions: Pacemaker placement, initiation of anticoagulation for atrial fibrillation, and adrenal mass biopsy -pacemaker placement obviously takes precedent -the timing of initiation of anticoagulation and biopsy of adrenal mass will be determined after his pacemaker is placed  DVT prophylaxis: lovenox  Code Status: FULL CODE Family Communication: no family present at time of exam  Disposition Plan: ICU  Consultants:  Cardioloyg Vasc Surgery   Antimicrobials:  none   Objective: Blood pressure 102/70, pulse (!) 59, temperature 97.7 F (36.5 C), temperature source Oral, resp. rate (!) 26, height 5\' 9"  (1.753 m), weight 68.6 kg, SpO2 93 %.  Intake/Output Summary (Last 24 hours) at 09/23/2018 0953 Last data filed at 09/23/2018 0800 Gross per 24 hour  Intake 1530.41  ml  Output 1000 ml  Net 530.41 ml   Filed Weights   09/22/18 1516 09/23/18 0038 09/23/18 0500  Weight: 71.2 kg 66.9 kg 68.6 kg    Examination: General: No acute respiratory distress Lungs: Clear to auscultation bilaterally without wheezes or crackles Cardiovascular: Bradycardic but regular with no appreciable murmur or rub Abdomen: Nontender, nondistended, soft, bowel sounds positive, no rebound, no ascites, no appreciable mass Extremities: Right foot  is warm to touch without discoloration of skin but with no palpable pulses to exam   CBC: Recent Labs  Lab 09/22/18 1552 09/23/18 0236  WBC 8.0 8.2  NEUTROABS 5.2  --   HGB 16.8 15.3  HCT 51.4 46.3  MCV 108.2* 105.7*  PLT 203 782   Basic Metabolic Panel: Recent Labs  Lab 09/22/18 1552 09/23/18 0236  NA 136 135  K 4.0 5.0  CL 103 103  CO2 23 23  GLUCOSE 104* 98  BUN 17 13  CREATININE 0.84 0.89  CALCIUM 9.1 8.7*   GFR: Estimated Creatinine Clearance: 83.5 mL/min (by C-G formula based on SCr of 0.89 mg/dL).  Liver Function Tests: Recent Labs  Lab 09/22/18 1552  AST 26  ALT 30  ALKPHOS 77  BILITOT 0.7  PROT 7.6  ALBUMIN 3.7    Coagulation Profile: Recent Labs  Lab 09/22/18 1921  INR 1.26    Cardiac Enzymes: Recent Labs  Lab 09/22/18 1552 09/23/18 0236  TROPONINI <0.03 <0.03    Recent Results (from the past 240 hour(s))  MRSA PCR Screening     Status: None   Collection Time: 09/23/18 12:24 AM  Result Value Ref Range Status   MRSA by PCR NEGATIVE NEGATIVE Final    Comment:        The GeneXpert MRSA Assay (FDA approved for NASAL specimens only), is one component of a comprehensive MRSA colonization surveillance program. It is not intended to diagnose MRSA infection nor to guide or monitor treatment for MRSA infections. Performed at [facility], [place] [address] [place], [place] [address]      Scheduled Meds: . aspirin EC  81 mg Oral Daily  . cholecalciferol  2,000 Units Oral Daily  . folic acid  1 mg Oral Daily  . montelukast  10 mg Oral Daily  . multivitamin with minerals  1 tablet Oral Daily  . pantoprazole  40 mg Oral Daily   Continuous Infusions: . sodium chloride       LOS: 0 days   Cherene Altes, MD [facility] Office  [phone] Pager - Text Page per Amion  If 7PM-7AM, please contact night-coverage per Amion 09/23/2018, 9:53 AM

## 2018-09-24 ENCOUNTER — Inpatient Hospital Stay (HOSPITAL_COMMUNITY): Payer: Medicare HMO

## 2018-09-24 ENCOUNTER — Encounter (HOSPITAL_COMMUNITY): Payer: Self-pay | Admitting: Internal Medicine

## 2018-09-24 LAB — CBC
HCT: 46.3 % (ref 39.0–52.0)
HEMOGLOBIN: 15.6 g/dL (ref 13.0–17.0)
MCH: 35.5 pg — ABNORMAL HIGH (ref 26.0–34.0)
MCHC: 33.7 g/dL (ref 30.0–36.0)
MCV: 105.5 fL — ABNORMAL HIGH (ref 80.0–100.0)
PLATELETS: 176 10*3/uL (ref 150–400)
RBC: 4.39 MIL/uL (ref 4.22–5.81)
RDW: 12.8 % (ref 11.5–15.5)
WBC: 8.4 10*3/uL (ref 4.0–10.5)
nRBC: 0 % (ref 0.0–0.2)

## 2018-09-24 LAB — COMPREHENSIVE METABOLIC PANEL
ALT: 27 U/L (ref 0–44)
AST: 30 U/L (ref 15–41)
Albumin: 3.2 g/dL — ABNORMAL LOW (ref 3.5–5.0)
Alkaline Phosphatase: 71 U/L (ref 38–126)
Anion gap: 7 (ref 5–15)
BUN: 11 mg/dL (ref 8–23)
CO2: 27 mmol/L (ref 22–32)
Calcium: 9.2 mg/dL (ref 8.9–10.3)
Chloride: 101 mmol/L (ref 98–111)
Creatinine, Ser: 0.84 mg/dL (ref 0.61–1.24)
GFR calc Af Amer: >60 mL/min (ref >60)
GFR calc non Af Amer: >60 mL/min (ref >60)
Glucose, Bld: 94 mg/dL (ref 70–99)
Potassium: 4.1 mmol/L (ref 3.5–5.1)
Sodium: 135 mmol/L (ref 135–145)
Total Bilirubin: 0.7 mg/dL (ref 0.3–1.2)
Total Protein: 7 g/dL (ref 6.5–8.1)

## 2018-09-24 LAB — MAGNESIUM: Magnesium: 1.7 mg/dL (ref 1.7–2.4)

## 2018-09-24 MED ORDER — INFLUENZA VAC SPLIT QUAD 0.5 ML IM SUSY
0.5000 mL | PREFILLED_SYRINGE | Freq: Once | INTRAMUSCULAR | Status: AC
  Administered 2018-09-24: 0.5 mL via INTRAMUSCULAR
  Filled 2018-09-24: qty 0.5

## 2018-09-24 MED ORDER — RIVAROXABAN (XARELTO) VTE STARTER PACK (15 & 20 MG): ORAL_TABLET | ORAL | 0 refills | Status: DC

## 2018-09-24 MED ORDER — INFLUENZA VAC SPLIT QUAD 0.5 ML IM SUSY: 0.5000 mL | PREFILLED_SYRINGE | INTRAMUSCULAR | Status: DC

## 2018-09-24 MED ORDER — HYDROCODONE-ACETAMINOPHEN 5-325 MG PO TABS: 1.0000 | ORAL_TABLET | Freq: Four times a day (QID) | ORAL | 0 refills | Status: DC | PRN

## 2018-09-24 NOTE — Progress Notes (Addendum)
Progress Note  Patient Name: Alejandro Rice Date of Encounter: 09/24/2018  Primary Cardiologist: Jenkins Rouge, MD   Subjective   Minimal "if any" site discomfort, R heel pain this AM, no CP or SOB  Inpatient Medications    Scheduled Meds: . aspirin EC  81 mg Oral Daily  . cholecalciferol  2,000 Units Oral Daily  . folic acid  1 mg Oral Daily  . montelukast  10 mg Oral Daily  . multivitamin with minerals  1 tablet Oral Daily  . pantoprazole  40 mg Oral Daily   Continuous Infusions: . sodium chloride     PRN Meds: acetaminophen, albuterol, HYDROcodone-acetaminophen, hydrOXYzine, ondansetron (ZOFRAN) IV   Vital Signs    Vitals:   09/23/18 2009 09/24/18 0048 09/24/18 0350 09/24/18 0356  BP: 130/88 138/89  116/82  Pulse: 69 81  78  Resp: 18 18  18   Temp: 98.5 F (36.9 C) 98.7 F (37.1 C)  97.8 F (36.6 C)  TempSrc: Oral Oral  Oral  SpO2: 95% 94%  97%  Weight:   67 kg   Height:        Intake/Output Summary (Last 24 hours) at 09/24/2018 0943 Last data filed at 09/24/2018 6578 Gross per 24 hour  Intake 50 ml  Output 2650 ml  Net -2600 ml   Last 3 Weights 09/24/2018 09/23/2018 09/23/2018  Weight (lbs) 147 lb 11.2 oz 151 lb 3.8 oz 147 lb 7.8 oz  Weight (kg) 66.996 kg 68.6 kg 66.9 kg      Telemetry    SR - Personally Reviewed  ECG    SR - Personally Reviewed  Physical Exam   GEN: No acute distress.   Neck: No JVD Cardiac: RRR, no murmurs, rubs, or gallops.  Respiratory: CTA b/l. GI: Soft, nontender, non-distended  MS: No edema; No deformity.  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6  HCT 51.4 46.3 46.3  MCV 108.2* 105.7* 105.5*  MCH 35.4* 34.9* 35.5*  MCHC 32.7 33.0 33.7  RDW 13.1 12.9 12.8  PLT 203 178 176    Cardiac Enzymes Recent Labs  Lab 09/22/18 1552 09/23/18 0236 09/23/18 0854 09/23/18 1516  TROPONINI <0.03 <0.03 <0.03 <0.03   No results for input(s): TROPIPOC in the last 168 hours.   BNPNo results for input(s): BNP, PROBNP in the last 168 hours.   DDimer No results for input(s): DDIMER in the last 168 hours.   Radiology    Dg Chest 2 View Result Date: 09/24/2018 CLINICAL DATA:  Status post pacemaker placement EXAM:  CHEST - 2 VIEW COMPARISON:  09/22/2018 FINDINGS: Cardiac shadow is stable. The lungs are again hyperinflated. Aortic calcifications are seen. New pacing device is noted in satisfactory position. No pneumothorax is seen. Chronic scarring in the right base is seen. Old rib fractures are noted on left. IMPRESSION: No pneumothorax following pacemaker placement. Emphysematous changes stable from the previous exams. Electronically Signed   By: Inez Catalina M.D.   On: 09/24/2018 07:14       Vas [place] Burnard Bunting With/wo Tbi Result Date: 09/23/2018 LOWER EXTREMITY DOPPLER STUDY Indications: Foot numbness.  Performing Technologist: Carlos Levering Rvt  Examination Guidelines: A complete evaluation includes at minimum, Doppler waveform signals and systolic blood pressure reading at the level of bilateral brachial, anterior tibial, and posterior tibial arteries, when vessel segments are accessible. Bilateral testing is considered an integral part of a complete examination. Photoelectric Plethysmograph (PPG) waveforms and toe systolic pressure readings are included as required and additional  duplex testing as needed. Limited examinations for reoccurring indications may be performed as noted.  ABI Findings: Summary: Right: Resting right ankle-brachial index indicates moderate right lower extremity arterial disease. The right toe-brachial index is abnormal. Left: Unable to obtain pressures due to below the knee amputation.  *See table(s) above for measurements and observations.  Electronically signed by Deitra Mayo MD on 09/23/2018 at 1:46:57 PM.   Final     Cardiac Studies   09/23/2018: TTE Study Conclusions - Procedure narrative: Transthoracic echocardiography. Image   quality was adequate. The study was technically difficult, as a   result of poor sound wave transmission. Intravenous contrast   (Definity) was administered. - Left ventricle: The cavity size was normal. Wall thickness was   increased in a pattern of mild LVH. Systolic function was normal.   The estimated ejection fraction was in the range of 60% to 65%.   The study is not technically sufficient to allow evaluation of LV   diastolic function. - Mitral valve: Mildly thickened leaflets . There was trivial   regurgitation. - Left atrium: The atrium was normal in size. - Right atrium: The atrium was mildly dilated. - Tricuspid valve: There was mild regurgitation. - Pulmonary arteries: PA peak pressure: 60 mm Hg (S). - Inferior vena cava: The vessel was dilated. The respirophasic   diameter changes were blunted (< 50%), consistent with elevated   central venous pressure. Impressions: - Tecnically difficult study. Compared to a prior study in 2017,   there are few changes. There is moderate pulmonary hypertension   with RVSP of 60 mmHg.  Patient Profile     63 y.o. male with a hx of COPD, hepatocellular carcinoma 2018 had microwave ablation, liver cirrhosis, Hep C, (Dr Oneida Alar on [place]), h/o ETOH abuse (none in 3 years),  HTN, h/o traumatic L BKA   came to [facility] with intermittent painful and cold  R foot  and abdominal pain, was noted in fast AFib started on dilt gtt  >>> sinus arrest w/LOC  Assessment & Plan    1. Tachy-brady     AFib 130's had spontaneous conversion with pausing/sinus arrest 5.3 sec > 10.9 sec > 4.3 sec > SR/SB      S/p PPM implant yesterday with Dr. Lovena Le     Device check this AM with intact function/findings     CXR w/o ptx     Implant site is stable     Wound care and activity instructions were reviewed with the patient     Post PPM follow up  is in place  Resume home meds Atenolol 50mg  daily Diltiazem 120mg  daily   2. Afib/flutter (described as paroxysmal)     Minimally symptomatic, unclear burden by symptoms  Vascular consult noted, possibly neuropathy, small vessel disease   ? embolic event given his AFib to R foot/RLE, this would make his CHA2DS2Vasc is 3, if no contraindication to full anticoagulation, we recommend starting Xarelto 20mg  daily though not until Sunday 09/28/2018 post pacer   3. R foot pain     Notes initially describe pallor, diminished pulses     CT run off without significant occlusion until below ankle where they taper out (small vessel disease)     He reports R heel pain/tenderness this AM Foot is warm, has good ROM, heel is mildly tender Deferred to medicine team    4. Adrenal mass (enlarging)     d/w medicine, for out patient f/u, no MRI needed this admission    [facility] will sign off.   Medication Recommendations:    Resume home meds Atenolol 50mg  daily Diltiazem 120mg  daily Recommend starting xarelto 20mg  daily to start 09/28/2018, if no contraindications  Other recommendations (labs, testing, etc): none from EP perspective Follow up as an outpatient:  Is in place  For questions or updates, please contact [facility] Please consult [email or web address] for contact info under        Signed, Baldwin Jamaica, PA-C  09/24/2018, 9:43 AM    EP Attending  Patient seen and examined. Agree with above. PPM  interogation under my supervision demonstrates normal DDD PM function. CXR looks good. He is stable for DC home. We will hold off on initiation of an [place] for 4 days to reduce the risk of device related bleeding.  Mikle Bosworth.D.

## 2018-09-24 NOTE — Progress Notes (Signed)
Xarelto coupon given to patient as requested with explanation of usage; Aneta Mins [phone]
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
## 2018-09-24 NOTE — Discharge Instructions (Signed)
° ° °  Supplemental Discharge Instructions for  Pacemaker/Defibrillator Patients  Activity No heavy lifting or vigorous activity with your left/right arm for 6 to 8 weeks.  Do not raise your left/right arm above your head for one week.  Gradually raise your affected arm as drawn below.             09/27/2018                  09/28/2018                09/29/2018              09/30/2018 __  NO DRIVING (the patient does not drive)  WOUND CARE - Keep the wound area clean and dry.  Do not get this area wet for one week. No showers for one week; you may shower on 09/30/2018    . - The tape/steri-strips on your wound will fall off; do not pull them off.  No bandage is needed on the site.  DO  NOT apply any creams, oils, or ointments to the wound area. - If you notice any drainage or discharge from the wound, any swelling or bruising at the site, or you develop a fever > 101? F after you are discharged home, call the office at once.  Special Instructions - You are still able to use cellular telephones; use the ear opposite the side where you have your pacemaker/defibrillator.  Avoid carrying your cellular phone near your device. - When traveling through airports, show security personnel your identification card to avoid being screened in the metal detectors.  Ask the security personnel to use the hand wand. - Avoid arc welding equipment, MRI testing (magnetic resonance imaging), TENS units (transcutaneous nerve stimulators).  Call the office for questions about other devices. - Avoid electrical appliances that are in poor condition or are not properly grounded. - Microwave ovens are safe to be near or to operate.
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
## 2018-12-23 ENCOUNTER — Encounter: Payer: Medicare HMO | Admitting: Internal Medicine

## 2018-12-23 ENCOUNTER — Other Ambulatory Visit: Payer: Self-pay | Admitting: Internal Medicine

## 2018-12-23 MED ORDER — RIVAROXABAN 20 MG PO TABS: 20.0000 mg | ORAL_TABLET | Freq: Every day | ORAL | 6 refills | Status: DC

## 2018-12-23 NOTE — Telephone Encounter (Signed)
Refilled xarelto 20 mg qd per phone request

## 2018-12-23 NOTE — Telephone Encounter (Signed)
°*  STAT* If patient is at the pharmacy, call can be transferred to refill team.   1. Which medications need to be refilled?Xarelto   2. Which pharmacy/location (including street and city if local pharmacy) is medication to be sent to? [facility]  3. Do they need a 30 day or 90 day supply?

## 2018-12-26 ENCOUNTER — Encounter: Payer: Medicare HMO | Admitting: Internal Medicine

## 2018-12-26 ENCOUNTER — Telehealth (INDEPENDENT_AMBULATORY_CARE_PROVIDER_SITE_OTHER): Payer: Medicare HMO | Admitting: Internal Medicine

## 2018-12-26 DIAGNOSIS — I480 Paroxysmal atrial fibrillation: Secondary | ICD-10-CM | POA: Diagnosis not present

## 2018-12-26 NOTE — Patient Instructions (Signed)
Medication Instructions: Your physician recommends that you continue on your current medications as directed. Please refer to the Current Medication list given to you today.   Labwork: None today  Procedures/Testing: None today  Follow-Up: 2 months with Dr.Taylor  Any Additional Special Instructions Will Be Listed Below (If Applicable).     If you need a refill on your cardiac medications before your next appointment, please call your pharmacy.     Thank you for choosing [facility] !
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
## 2019-01-08 ENCOUNTER — Other Ambulatory Visit: Payer: Self-pay | Admitting: Cardiovascular Disease

## 2019-01-16 ENCOUNTER — Other Ambulatory Visit: Payer: Self-pay | Admitting: Cardiovascular Disease

## 2019-02-05 ENCOUNTER — Emergency Department (HOSPITAL_COMMUNITY): Payer: Medicare HMO

## 2019-02-05 ENCOUNTER — Emergency Department (HOSPITAL_COMMUNITY)
Admission: EM | Admit: 2019-02-05 | Discharge: 2019-02-05 | Disposition: A | Discharge status: Home / Self Care | Payer: Medicare HMO | Attending: Emergency Medicine | Admitting: Emergency Medicine

## 2019-02-05 ENCOUNTER — Encounter (HOSPITAL_COMMUNITY): Payer: Self-pay | Admitting: Emergency Medicine

## 2019-02-05 ENCOUNTER — Other Ambulatory Visit: Payer: Self-pay

## 2019-02-05 DIAGNOSIS — R339 Retention of urine, unspecified: Secondary | ICD-10-CM | POA: Diagnosis not present

## 2019-02-05 DIAGNOSIS — Z7901 Long term (current) use of anticoagulants: Secondary | ICD-10-CM | POA: Diagnosis not present

## 2019-02-05 DIAGNOSIS — R1030 Lower abdominal pain, unspecified: Secondary | ICD-10-CM | POA: Diagnosis present

## 2019-02-05 DIAGNOSIS — R1900 Intra-abdominal and pelvic swelling, mass and lump, unspecified site: Secondary | ICD-10-CM

## 2019-02-05 DIAGNOSIS — E278 Other specified disorders of adrenal gland: Secondary | ICD-10-CM | POA: Insufficient documentation

## 2019-02-05 DIAGNOSIS — I10 Essential (primary) hypertension: Secondary | ICD-10-CM | POA: Diagnosis not present

## 2019-02-05 DIAGNOSIS — F17210 Nicotine dependence, cigarettes, uncomplicated: Secondary | ICD-10-CM | POA: Diagnosis not present

## 2019-02-05 DIAGNOSIS — I4891 Unspecified atrial fibrillation: Secondary | ICD-10-CM | POA: Insufficient documentation

## 2019-02-05 LAB — URINALYSIS, ROUTINE W REFLEX MICROSCOPIC
Bilirubin Urine: NEGATIVE
Glucose, UA: NEGATIVE mg/dL
Hgb urine dipstick: NEGATIVE
Ketones, ur: 5 mg/dL — AB
Leukocytes,Ua: NEGATIVE
Nitrite: NEGATIVE
Protein, ur: NEGATIVE mg/dL
Specific Gravity, Urine: 1.015 (ref 1.005–1.030)
pH: 6 (ref 5.0–8.0)

## 2019-02-05 LAB — COMPREHENSIVE METABOLIC PANEL
ALT: 218 U/L — ABNORMAL HIGH (ref 0–44)
AST: 40 U/L (ref 15–41)
Albumin: 3.6 g/dL (ref 3.5–5.0)
Alkaline Phosphatase: 90 U/L (ref 38–126)
Anion gap: 13 (ref 5–15)
BUN: 16 mg/dL (ref 8–23)
CO2: 24 mmol/L (ref 22–32)
Calcium: 9.4 mg/dL (ref 8.9–10.3)
Chloride: 99 mmol/L (ref 98–111)
Creatinine, Ser: 1.05 mg/dL (ref 0.61–1.24)
GFR calc Af Amer: >60 mL/min (ref >60)
GFR calc non Af Amer: >60 mL/min (ref >60)
Glucose, Bld: 172 mg/dL — ABNORMAL HIGH (ref 70–99)
Potassium: 5.1 mmol/L (ref 3.5–5.1)
Sodium: 136 mmol/L (ref 135–145)
Total Bilirubin: 1.1 mg/dL (ref 0.3–1.2)
Total Protein: 7.4 g/dL (ref 6.5–8.1)

## 2019-02-05 LAB — CBC
HCT: 50.4 % (ref 39.0–52.0)
Hemoglobin: 16.7 g/dL (ref 13.0–17.0)
MCH: 35.7 pg — ABNORMAL HIGH (ref 26.0–34.0)
MCHC: 33.1 g/dL (ref 30.0–36.0)
MCV: 107.7 fL — ABNORMAL HIGH (ref 80.0–100.0)
Platelets: 233 10*3/uL (ref 150–400)
RBC: 4.68 MIL/uL (ref 4.22–5.81)
RDW: 13.2 % (ref 11.5–15.5)
WBC: 9.6 10*3/uL (ref 4.0–10.5)
nRBC: 0 % (ref 0.0–0.2)

## 2019-02-05 LAB — LIPASE, BLOOD: Lipase: 24 U/L (ref 11–51)

## 2019-02-05 MED ORDER — IOHEXOL 300 MG/ML  SOLN
100.0000 mL | Freq: Once | INTRAMUSCULAR | Status: AC | PRN
  Administered 2019-02-05: 100 mL via INTRAVENOUS

## 2019-02-05 MED ORDER — SODIUM CHLORIDE 0.9% FLUSH: 3.0000 mL | Freq: Once | INTRAVENOUS | Status: DC

## 2019-02-05 NOTE — ED Notes (Signed)
ED Provider at bedside. 

## 2019-02-05 NOTE — ED Triage Notes (Signed)
Patient reports lower abdominal pain that started 2 days ago. No v/d.

## 2019-02-05 NOTE — Discharge Instructions (Addendum)
As discussed, you have a mass in your pelvis which needs to have further testing, but it is suspected that it may be a new mass which has spread, possibly secondarily from the adrenal mass.  Call Dr. Delton Coombes in the morning for an office visit for further assistance with this finding.  In the interim, the foley catheter is being left in your bladder so you do not continue to have urinary retention.  Call Dr. Lovena Neighbours for an office appointment for further assistance with the urinary retention and for a trial of removing the tube.

## 2019-02-05 NOTE — ED Provider Notes (Signed)
Pt signed out to me pending Abd/pelvic Ct scan.  Pt with low back pain with radiation to suprapubic region, urinary retention, sx improved after foley cath placed.  Ct imaging showing suspected metastatic disease, results as follows.  Results for orders placed or performed during the hospital encounter of 02/05/19  Lipase, blood  Result Value Ref Range   Lipase 24 11 - 51 U/L  Comprehensive metabolic panel  Result Value Ref Range   Sodium 136 135 - 145 mmol/L   Potassium 5.1 3.5 - 5.1 mmol/L   Chloride 99 98 - 111 mmol/L   CO2 24 22 - 32 mmol/L   Glucose, Bld 172 (H) 70 - 99 mg/dL   BUN 16 8 - 23 mg/dL   Creatinine, Ser 1.05 0.61 - 1.24 mg/dL   Calcium 9.4 8.9 - 10.3 mg/dL   Total Protein 7.4 6.5 - 8.1 g/dL   Albumin 3.6 3.5 - 5.0 g/dL   AST 40 15 - 41 U/L   ALT 218 (H) 0 - 44 U/L   Alkaline Phosphatase 90 38 - 126 U/L   Total Bilirubin 1.1 0.3 - 1.2 mg/dL   GFR calc non Af Amer >60 >60 mL/min   GFR calc Af Amer >60 >60 mL/min   Anion gap 13 5 - 15  CBC  Result Value Ref Range   WBC 9.6 4.0 - 10.5 K/uL   RBC 4.68 4.22 - 5.81 MIL/uL   Hemoglobin 16.7 13.0 - 17.0 g/dL   HCT 50.4 39.0 - 52.0 %   MCV 107.7 (H) 80.0 - 100.0 fL   MCH 35.7 (H) 26.0 - 34.0 pg   MCHC 33.1 30.0 - 36.0 g/dL   RDW 13.2 11.5 - 15.5 %   Platelets 233 150 - 400 K/uL   nRBC 0.0 0.0 - 0.2 %  Urinalysis, Routine w reflex microscopic  Result Value Ref Range   Color, Urine YELLOW YELLOW   APPearance HAZY (A) CLEAR   Specific Gravity, Urine 1.015 1.005 - 1.030   pH 6.0 5.0 - 8.0   Glucose, UA NEGATIVE NEGATIVE mg/dL   Hgb urine dipstick NEGATIVE NEGATIVE   Bilirubin Urine NEGATIVE NEGATIVE   Ketones, ur 5 (A) NEGATIVE mg/dL   Protein, ur NEGATIVE NEGATIVE mg/dL   Nitrite NEGATIVE NEGATIVE   Leukocytes,Ua NEGATIVE NEGATIVE   Ct Abdomen Pelvis W Contrast  Result Date: 02/05/2019 CLINICAL DATA:  63 year old male with lower pelvic pain and back pain for 2-3 days. History of cirrhosis and ablated [place].  Chronic left adrenal mass. EXAM: CT ABDOMEN AND PELVIS WITH CONTRAST TECHNIQUE: Multidetector CT imaging of the abdomen and pelvis was performed using the standard protocol following bolus administration of intravenous contrast. CONTRAST:  167mL OMNIPAQUE IOHEXOL 300 MG/ML  SOLN COMPARISON:  CT Abdomen and Pelvis 01/03/2018 and earlier. CTA abdomen pelvis and runoff 09/22/2018. FINDINGS: Lower chest: Bilateral lower lobe and right middle lobe bronchiectasis with scarring and superimposed centrilobular emphysema. No acute lung base opacity. No pericardial or pleural effusion. Cardiac pacemaker lead. No cardiomegaly. Hepatobiliary: The liver appears stable. Negative gallbladder. Pancreas: Negative. Spleen: Stable, negative. Adrenals/Urinary Tract: Substantially enlarged left adrenal mass since May 2019, now up to 7 centimeters long axis (previously 21 millimeters). This has also substantially increased since January (38 millimeters at that time. The right adrenal gland remains normal. Bilateral renal enhancement and contrast excretion is symmetric and within normal limits. The urinary bladder is decompressed by a Foley catheter. Stomach/Bowel: No dilated small bowel. No large bowel inflammation. Negative stomach and duodenum.  No free air. No free fluid. Vascular/Lymphatic: No pelvic lymphadenopathy. No abdominal lymphadenopathy. Aortoiliac calcified atherosclerosis. Major arterial structures in the abdomen and pelvis remain patent. Portal venous system appears to be patent. Reproductive: Fat containing right inguinal hernia is stable. Otherwise negative; Foley catheter in place. Other: Mild presacral stranding. No free fluid. Musculoskeletal: There is a destructive soft tissue mass in the sacrum in the midline and on the right at the S2-S3 level which is new since 2017 (series 2, image 53 and sagittal image 63). The sacral spinal canal at S3 and the right S3 neural foramen appear obliterated. There is associated  presacral soft tissue mass on the right in the lower pelvis measuring up to 18 millimeters in thickness on series 2, image 58. Elsewhere osteopenia is stable. No other acute or suspicious osseous lesion identified. Chronic left femur ORIF is partially visible. IMPRESSION: 1. Positive for a destructive soft tissue mass centered at the right S2-S3 level with associated soft tissue mass tracking into the posterior right pelvis. Superimposed progressed and now large 7 cm left adrenal mass. These are most compatible with Metastatic Disease, from hepatocellular carcinoma versus an unknown primary in this clinical setting. 2. 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 3. Aortic Atherosclerosis (ICD10-I70.0). Electronically Signed   By: Genevie Ann M.D.   On: 02/05/2019 18:54     Discussed findings with patient.  Past medical hx discussed including h/o adrenal mass. Pt states he was supposed to get further testing of the adrenal gland issue but was told he had to stop smoking for 5 days in order to have the test (unsure of procedure type?), so didn't get.  States has cirrhosis, but states he did not have liver ca, had a "spot" that underwent thermal ablation (per chart 7/18).   Pt will need close f/u with oncology - referral given to Dr. Delton Coombes, pt to call for appt in am.  Also, urinary retention here so foley left, leg bag given.  Referral to urology for further eval/treat of this problem.  Suspect urinary retention may be possible complication of the pelvic mass found today.  He was comfortable without complaint after bladder decompressed. More than1500 cc of urine obtained during ed stay.   Evalee Jefferson, PA-C 02/05/19 2008    Margette Fast, MD 02/06/19 1341
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
## 2019-02-10 ENCOUNTER — Encounter (HOSPITAL_COMMUNITY): Payer: Self-pay | Admitting: Hematology

## 2019-02-10 ENCOUNTER — Other Ambulatory Visit: Payer: Self-pay

## 2019-02-10 ENCOUNTER — Inpatient Hospital Stay (HOSPITAL_COMMUNITY): Payer: Medicare HMO | Attending: Hematology | Admitting: Hematology

## 2019-02-10 ENCOUNTER — Inpatient Hospital Stay (HOSPITAL_COMMUNITY): Payer: Medicare HMO

## 2019-02-10 VITALS — BP 146/81 | HR 80 | Temp 98.2°F | Resp 18 | Wt 156.1 lb

## 2019-02-10 DIAGNOSIS — R220 Localized swelling, mass and lump, head: Secondary | ICD-10-CM | POA: Insufficient documentation

## 2019-02-10 DIAGNOSIS — C7951 Secondary malignant neoplasm of bone: Secondary | ICD-10-CM | POA: Insufficient documentation

## 2019-02-10 DIAGNOSIS — I4891 Unspecified atrial fibrillation: Secondary | ICD-10-CM | POA: Insufficient documentation

## 2019-02-10 DIAGNOSIS — R319 Hematuria, unspecified: Secondary | ICD-10-CM | POA: Diagnosis not present

## 2019-02-10 DIAGNOSIS — E279 Disorder of adrenal gland, unspecified: Secondary | ICD-10-CM | POA: Insufficient documentation

## 2019-02-10 DIAGNOSIS — E278 Other specified disorders of adrenal gland: Secondary | ICD-10-CM

## 2019-02-10 DIAGNOSIS — C220 Liver cell carcinoma: Secondary | ICD-10-CM | POA: Diagnosis not present

## 2019-02-10 DIAGNOSIS — Z8505 Personal history of malignant neoplasm of liver: Secondary | ICD-10-CM | POA: Insufficient documentation

## 2019-02-10 LAB — LACTATE DEHYDROGENASE: LDH: 172 U/L (ref 98–192)

## 2019-02-10 NOTE — Patient Instructions (Signed)
[place] at [facility] Discharge Instructions  You were seen today by Dr. Delton Coombes. He went over your history, family history, and how you've been feeling lately. He went over your scans done in the ER and what those findings are. He would like you to have a PET scan, and CT scan of your brain. He will see you back after your scans for follow up.   Thank you for choosing [place] [facility] to provide your oncology and hematology care.  To afford each patient quality time with our provider, please arrive at least 15 minutes before your scheduled appointment time.   If you have a lab appointment with the [facility] please come in thru the  Main Entrance and check in at the main information desk  You need to re-schedule your appointment should you arrive 10 or more minutes late.  We strive to give you quality time with our providers, and arriving late affects you and other patients whose appointments are after yours.  Also, if you no show three or more times for appointments you may be dismissed from the clinic at the providers discretion.     Again, thank you for choosing [facility].  Our hope is that these requests will decrease the amount of time that you wait before being seen by our physicians.       _____________________________________________________________  Should you have questions after your visit to [facility], please contact our office at [phone] between the hours of 8:00 a.m. and 4:30 p.m.  Voicemails left after 4:00 p.m. will not be returned until the following business day.  For prescription refill requests, have your pharmacy contact our office and allow 72 hours.    Cancer Center Support Programs:   > Cancer Support Group  2nd Tuesday of the month 1pm-2pm, Journey Room
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
## 2019-02-11 LAB — AFP TUMOR MARKER: AFP, Serum, Tumor Marker: 4.5 ng/mL (ref 0.0–8.3)

## 2019-02-17 ENCOUNTER — Ambulatory Visit (HOSPITAL_COMMUNITY): Payer: Medicare HMO

## 2019-02-20 ENCOUNTER — Other Ambulatory Visit: Payer: Self-pay

## 2019-02-20 ENCOUNTER — Encounter (HOSPITAL_COMMUNITY)
Admission: RE | Admit: 2019-02-20 | Discharge: 2019-02-20 | Disposition: A | Discharge status: Home / Self Care | Payer: Medicare HMO | Source: Ambulatory Visit | Attending: Hematology | Admitting: Hematology

## 2019-02-20 DIAGNOSIS — I2510 Atherosclerotic heart disease of native coronary artery without angina pectoris: Secondary | ICD-10-CM | POA: Diagnosis not present

## 2019-02-20 DIAGNOSIS — E278 Other specified disorders of adrenal gland: Secondary | ICD-10-CM | POA: Insufficient documentation

## 2019-02-20 DIAGNOSIS — J439 Emphysema, unspecified: Secondary | ICD-10-CM | POA: Insufficient documentation

## 2019-02-20 DIAGNOSIS — C7951 Secondary malignant neoplasm of bone: Secondary | ICD-10-CM | POA: Insufficient documentation

## 2019-02-20 DIAGNOSIS — I700 Atherosclerosis of aorta: Secondary | ICD-10-CM | POA: Diagnosis not present

## 2019-02-20 DIAGNOSIS — C800 Disseminated malignant neoplasm, unspecified: Secondary | ICD-10-CM | POA: Diagnosis not present

## 2019-02-20 LAB — GLUCOSE, CAPILLARY: Glucose-Capillary: 86 mg/dL (ref 70–99)

## 2019-02-20 MED ORDER — FLUDEOXYGLUCOSE F - 18 (FDG) INJECTION
7.8000 | Freq: Once | INTRAVENOUS | Status: AC
  Administered 2019-02-20: 7.8 via INTRAVENOUS

## 2019-02-23 ENCOUNTER — Other Ambulatory Visit: Payer: Self-pay

## 2019-02-23 ENCOUNTER — Ambulatory Visit (HOSPITAL_COMMUNITY)
Admission: RE | Admit: 2019-02-23 | Discharge: 2019-02-23 | Disposition: A | Discharge status: Home / Self Care | Payer: Medicare HMO | Source: Ambulatory Visit | Attending: Hematology | Admitting: Hematology

## 2019-02-23 DIAGNOSIS — F17210 Nicotine dependence, cigarettes, uncomplicated: Secondary | ICD-10-CM | POA: Diagnosis not present

## 2019-02-23 DIAGNOSIS — Z79899 Other long term (current) drug therapy: Secondary | ICD-10-CM | POA: Insufficient documentation

## 2019-02-23 DIAGNOSIS — Z7901 Long term (current) use of anticoagulants: Secondary | ICD-10-CM | POA: Insufficient documentation

## 2019-02-23 DIAGNOSIS — Z8619 Personal history of other infectious and parasitic diseases: Secondary | ICD-10-CM | POA: Insufficient documentation

## 2019-02-23 DIAGNOSIS — Z7982 Long term (current) use of aspirin: Secondary | ICD-10-CM | POA: Diagnosis not present

## 2019-02-23 DIAGNOSIS — E278 Other specified disorders of adrenal gland: Secondary | ICD-10-CM | POA: Diagnosis not present

## 2019-02-23 DIAGNOSIS — C220 Liver cell carcinoma: Secondary | ICD-10-CM | POA: Insufficient documentation

## 2019-02-23 DIAGNOSIS — J328 Other chronic sinusitis: Secondary | ICD-10-CM | POA: Diagnosis not present

## 2019-02-23 MED ORDER — IOHEXOL 300 MG/ML  SOLN
75.0000 mL | Freq: Once | INTRAMUSCULAR | Status: AC | PRN
  Administered 2019-02-23: 12:00:00 75 mL via INTRAVENOUS

## 2019-02-24 ENCOUNTER — Encounter (HOSPITAL_COMMUNITY): Payer: Self-pay | Admitting: Hematology

## 2019-02-24 ENCOUNTER — Encounter (HOSPITAL_COMMUNITY): Payer: Self-pay | Admitting: Lab

## 2019-02-24 ENCOUNTER — Inpatient Hospital Stay (HOSPITAL_BASED_OUTPATIENT_CLINIC_OR_DEPARTMENT_OTHER): Payer: Medicare HMO | Admitting: Hematology

## 2019-02-24 VITALS — BP 123/77 | HR 63 | Temp 97.8°F | Resp 16 | Wt 148.2 lb

## 2019-02-24 DIAGNOSIS — C220 Liver cell carcinoma: Secondary | ICD-10-CM | POA: Diagnosis not present

## 2019-02-24 DIAGNOSIS — I4891 Unspecified atrial fibrillation: Secondary | ICD-10-CM

## 2019-02-24 DIAGNOSIS — C7951 Secondary malignant neoplasm of bone: Secondary | ICD-10-CM | POA: Diagnosis not present

## 2019-02-24 DIAGNOSIS — R319 Hematuria, unspecified: Secondary | ICD-10-CM | POA: Diagnosis not present

## 2019-02-24 DIAGNOSIS — E279 Disorder of adrenal gland, unspecified: Secondary | ICD-10-CM | POA: Diagnosis not present

## 2019-02-24 DIAGNOSIS — E278 Other specified disorders of adrenal gland: Secondary | ICD-10-CM

## 2019-02-24 NOTE — Progress Notes (Unsigned)
Referral to [facility].  Faxed records on 6/23
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
## 2019-02-25 ENCOUNTER — Other Ambulatory Visit: Payer: Self-pay

## 2019-02-25 ENCOUNTER — Encounter: Payer: Self-pay | Admitting: Internal Medicine

## 2019-02-25 ENCOUNTER — Ambulatory Visit (INDEPENDENT_AMBULATORY_CARE_PROVIDER_SITE_OTHER): Payer: Medicare HMO | Admitting: Internal Medicine

## 2019-02-25 VITALS — BP 118/83 | HR 94 | Temp 97.5°F | Ht 69.0 in | Wt 147.0 lb

## 2019-02-25 DIAGNOSIS — I480 Paroxysmal atrial fibrillation: Secondary | ICD-10-CM

## 2019-02-25 NOTE — Patient Instructions (Signed)
Medication Instructions:  Your physician recommends that you continue on your current medications as directed. Please refer to the Current Medication list given to you today.  If you need a refill on your cardiac medications before your next appointment, please call your pharmacy.   Lab work: NONE  If you have labs (blood work) drawn today and your tests are completely normal, you will receive your results only by: [name] MyChart Message (if you have MyChart) OR . A paper copy in the mail If you have any lab test that is abnormal or we need to change your treatment, we will call you to review the results.  Testing/Procedures: NONE    Follow-Up: At [facility], you and your health needs are our priority.  As part of our continuing mission to provide you with exceptional heart care, we have created designated Provider Care Teams.  These Care Teams include your primary Cardiologist (physician) and Advanced Practice Providers (APPs -  Physician Assistants and Nurse Practitioners) who all work together to provide you with the care you need, when you need it. You will need a follow up appointment in January .  Please call our office 2 months in advance to schedule this appointment.  You may see Dr. Lovena Le or one of the following Advanced Practice Providers on your designated Care Team:   Chanetta Marshall, NP . Tommye Standard, PA-C  Any Other Special Instructions Will Be Listed Below (If Applicable). Thank you for choosing [facility]!
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
## 2019-03-09 ENCOUNTER — Other Ambulatory Visit: Payer: Self-pay

## 2019-03-09 ENCOUNTER — Inpatient Hospital Stay (HOSPITAL_COMMUNITY): Payer: Medicare HMO | Admitting: Hematology

## 2019-03-12 ENCOUNTER — Telehealth (HOSPITAL_COMMUNITY): Payer: Self-pay

## 2019-03-17 ENCOUNTER — Other Ambulatory Visit: Payer: Self-pay

## 2019-03-17 ENCOUNTER — Other Ambulatory Visit: Payer: Self-pay | Admitting: Internal Medicine

## 2019-03-17 ENCOUNTER — Emergency Department (HOSPITAL_COMMUNITY)
Admission: EM | Admit: 2019-03-17 | Discharge: 2019-03-17 | Disposition: A | Discharge status: Home / Self Care | Payer: Medicare HMO | Attending: Emergency Medicine | Admitting: Emergency Medicine

## 2019-03-17 ENCOUNTER — Encounter (HOSPITAL_COMMUNITY): Payer: Self-pay

## 2019-03-17 DIAGNOSIS — Z7982 Long term (current) use of aspirin: Secondary | ICD-10-CM | POA: Insufficient documentation

## 2019-03-17 DIAGNOSIS — F17210 Nicotine dependence, cigarettes, uncomplicated: Secondary | ICD-10-CM | POA: Diagnosis not present

## 2019-03-17 DIAGNOSIS — T839XXA Unspecified complication of genitourinary prosthetic device, implant and graft, initial encounter: Secondary | ICD-10-CM

## 2019-03-17 DIAGNOSIS — I10 Essential (primary) hypertension: Secondary | ICD-10-CM | POA: Diagnosis not present

## 2019-03-17 DIAGNOSIS — Z7901 Long term (current) use of anticoagulants: Secondary | ICD-10-CM | POA: Insufficient documentation

## 2019-03-17 DIAGNOSIS — I739 Peripheral vascular disease, unspecified: Secondary | ICD-10-CM

## 2019-03-17 DIAGNOSIS — Z79899 Other long term (current) drug therapy: Secondary | ICD-10-CM | POA: Insufficient documentation

## 2019-03-17 DIAGNOSIS — I480 Paroxysmal atrial fibrillation: Secondary | ICD-10-CM | POA: Insufficient documentation

## 2019-03-17 DIAGNOSIS — Y829 Unspecified medical devices associated with adverse incidents: Secondary | ICD-10-CM | POA: Insufficient documentation

## 2019-03-17 DIAGNOSIS — T83091A Other mechanical complication of indwelling urethral catheter, initial encounter: Secondary | ICD-10-CM | POA: Insufficient documentation

## 2019-03-17 NOTE — ED Triage Notes (Signed)
Pt was seen 7 weeks ago unable to produce any urine. Catheter was placed and 1800 mL of urine was removed. Pt has since been wearing a foley catheter. Has not followed up with urologist yet. States he has to do some testing to determine if he has cancer or not before he can follow up with urology.
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
## 2019-03-19 ENCOUNTER — Ambulatory Visit (HOSPITAL_COMMUNITY): Payer: Medicare HMO | Admitting: Hematology

## 2019-03-23 ENCOUNTER — Ambulatory Visit (HOSPITAL_COMMUNITY): Payer: Medicare HMO | Admitting: Hematology

## 2019-03-24 ENCOUNTER — Other Ambulatory Visit: Payer: Self-pay | Admitting: Radiology

## 2019-03-24 ENCOUNTER — Other Ambulatory Visit: Payer: Self-pay | Admitting: Student

## 2019-03-25 ENCOUNTER — Encounter (HOSPITAL_COMMUNITY): Payer: Self-pay

## 2019-03-25 ENCOUNTER — Other Ambulatory Visit: Payer: Self-pay

## 2019-03-25 ENCOUNTER — Ambulatory Visit (HOSPITAL_COMMUNITY)
Admission: RE | Admit: 2019-03-25 | Discharge: 2019-03-25 | Disposition: A | Discharge status: Home / Self Care | Payer: Medicare HMO | Source: Ambulatory Visit | Attending: Hematology | Admitting: Hematology

## 2019-03-25 DIAGNOSIS — I4891 Unspecified atrial fibrillation: Secondary | ICD-10-CM | POA: Insufficient documentation

## 2019-03-25 DIAGNOSIS — Z7901 Long term (current) use of anticoagulants: Secondary | ICD-10-CM | POA: Insufficient documentation

## 2019-03-25 DIAGNOSIS — E278 Other specified disorders of adrenal gland: Secondary | ICD-10-CM | POA: Diagnosis present

## 2019-03-25 DIAGNOSIS — Z7982 Long term (current) use of aspirin: Secondary | ICD-10-CM | POA: Diagnosis not present

## 2019-03-25 DIAGNOSIS — C220 Liver cell carcinoma: Secondary | ICD-10-CM | POA: Diagnosis not present

## 2019-03-25 DIAGNOSIS — F17210 Nicotine dependence, cigarettes, uncomplicated: Secondary | ICD-10-CM | POA: Insufficient documentation

## 2019-03-25 DIAGNOSIS — I10 Essential (primary) hypertension: Secondary | ICD-10-CM | POA: Insufficient documentation

## 2019-03-25 DIAGNOSIS — C7951 Secondary malignant neoplasm of bone: Secondary | ICD-10-CM | POA: Insufficient documentation

## 2019-03-25 DIAGNOSIS — J439 Emphysema, unspecified: Secondary | ICD-10-CM | POA: Diagnosis not present

## 2019-03-25 DIAGNOSIS — Z8249 Family history of ischemic heart disease and other diseases of the circulatory system: Secondary | ICD-10-CM | POA: Diagnosis not present

## 2019-03-25 DIAGNOSIS — Z79899 Other long term (current) drug therapy: Secondary | ICD-10-CM | POA: Insufficient documentation

## 2019-03-25 LAB — CBC
HCT: 43.7 % (ref 39.0–52.0)
Hemoglobin: 14.3 g/dL (ref 13.0–17.0)
MCH: 35.5 pg — ABNORMAL HIGH (ref 26.0–34.0)
MCHC: 32.7 g/dL (ref 30.0–36.0)
MCV: 108.4 fL — ABNORMAL HIGH (ref 80.0–100.0)
Platelets: 357 10*3/uL (ref 150–400)
RBC: 4.03 MIL/uL — ABNORMAL LOW (ref 4.22–5.81)
RDW: 13.2 % (ref 11.5–15.5)
WBC: 6.8 10*3/uL (ref 4.0–10.5)
nRBC: 0 % (ref 0.0–0.2)

## 2019-03-25 LAB — PROTIME-INR
INR: 1.3 — ABNORMAL HIGH (ref 0.8–1.2)
Prothrombin Time: 16.3 seconds — ABNORMAL HIGH (ref 11.4–15.2)

## 2019-03-25 MED ORDER — SODIUM CHLORIDE 0.9 % IV SOLN: INTRAVENOUS | Status: DC

## 2019-03-25 MED ORDER — LIDOCAINE HCL 1 % IJ SOLN
INTRAMUSCULAR | Status: AC
  Filled 2019-03-25: qty 20

## 2019-03-25 MED ORDER — FENTANYL CITRATE (PF) 100 MCG/2ML IJ SOLN
INTRAMUSCULAR | Status: AC | PRN
  Administered 2019-03-25: 50 ug via INTRAVENOUS

## 2019-03-25 MED ORDER — MIDAZOLAM HCL 2 MG/2ML IJ SOLN
INTRAMUSCULAR | Status: AC
  Filled 2019-03-25: qty 4

## 2019-03-25 MED ORDER — FENTANYL CITRATE (PF) 100 MCG/2ML IJ SOLN
INTRAMUSCULAR | Status: AC
  Filled 2019-03-25: qty 4

## 2019-03-25 NOTE — Progress Notes (Signed)
   03/25/19 1340  Vital Signs  Pulse Rate 97  BP (!) 103/56  MEWS Score  MEWS RR 1  MEWS Pulse 0  MEWS Systolic 0  MEWS LOC 0  MEWS Temp 0  MEWS Score 1  MEWS Score Color Green   Patient states he feels fine and ready to go home. Patient denies any chest pain, discomfort, palpations, or dizziness. Patient still in afib but heart rate is 80-90s.Discharge orders written by Dr. Earleen Newport. IV removed, copy of discharge instructions and AVS reviewed with patient. Patient states understanding without any further questions.

## 2019-03-25 NOTE — Sedation Documentation (Addendum)
Patient in afib RVR. HR 130-150s, BP  115/92. Patient states he took heart medications this morning. Dr. Earleen Newport informed about heart rate and assessed patient in CT room. Patient will be given Fentanyl only for biopsy per Dr. Earleen Newport. Patient agreeable.

## 2019-03-25 NOTE — Procedures (Signed)
Interventional Radiology Procedure Note  Procedure: Ct biopsy of sacral soft tissue mass. .  Complications: None Recommendations:  - Ok to shower tomorrow - Do not submerge for 7 days - Routine care   Signed,  Dulcy Fanny. Earleen Newport, DO

## 2019-03-25 NOTE — Discharge Instructions (Addendum)
Needle Biopsy, Care After °These instructions tell you how to care for yourself after your procedure. Your doctor may also give you more specific instructions. Call your doctor if you have any problems or questions. °What can I expect after the procedure? °After the procedure, it is common to have: °· Soreness. °· Bruising. °· Mild pain. °Follow these instructions at home: ° °· Return to your normal activities as told by your doctor. Ask your doctor what activities are safe for you. °· Take over-the-counter and prescription medicines only as told by your doctor. °· Wash your hands with soap and water before you change your bandage (dressing). If you cannot use soap and water, use hand sanitizer. °· Follow instructions from your doctor about: °? How to take care of your puncture site. °? When and how to change your bandage. °? When to remove your bandage. °· Check your puncture site every day for signs of infection. Watch for: °? Redness, swelling, or pain. °? Fluid or blood.  °? Pus or a bad smell. °? Warmth. °· Do not take baths, swim, or use a hot tub until your doctor approves. Ask your doctor if you may take showers. You may only be allowed to take sponge baths. °· Keep all follow-up visits as told by your doctor. This is important. °Contact a doctor if you have: °· A fever. °· Redness, swelling, or pain at the puncture site, and it lasts longer than a few days. °· Fluid, blood, or pus coming from the puncture site. °· Warmth coming from the puncture site. °Get help right away if: °· You have a lot of bleeding from the puncture site. °Summary °· After the procedure, it is common to have soreness, bruising, or mild pain at the puncture site. °· Check your puncture site every day for signs of infection, such as redness, swelling, or pain. °· Get help right away if you have severe bleeding from your puncture site. °This information is not intended to replace advice given to you by your health care provider. Make  sure you discuss any questions you have with your health care provider. °Document Released: 08/02/2008 Document Revised: 09/02/2017 Document Reviewed: 09/02/2017 °Elsevier Patient Education © 2020 Else[employer] Inc[employer] °Moderate Conscious Sedation, Adult, Care After °These instructions provide you with information about caring for yourself after your procedure. Your health care provider may also give you more specific instructions. Your treatment has been planned according to current medical practices, but problems sometimes occur. Call your health care provider if you have any problems or questions after your procedure. °What can I expect after the procedure? °After your procedure, it is common: °· To feel sleepy for several hours. °· To feel clumsy and have poor balance for several hours. °· To have poor judgment for several hours. °· To vomit if you eat too soon. °Follow these instructions at home: °For at least 24 hours after the procedure: ° °· Do not: °? Participate in activities where you could fall or become injured. °? Drive. °? Use heavy machinery. °? Drink alcohol. °? Take sleeping pills or medicines that cause drowsiness. °? Make important decisions or sign legal documents. °? Take care of children on your own. °· Rest. °Eating and drinking °· Follow the diet recommended by your health care provider. °· If you vomit: °? Drink water, juice, or soup when you can drink without vomiting. °? Make sure you have little or no nausea before eating solid foods. °General instructions °· Have a responsible adult stay with   you until you are awake and alert. °· Take over-the-counter and prescription medicines only as told by your health care provider. °· If you smoke, do not smoke without supervision. °· Keep all follow-up visits as told by your health care provider. This is important. °Contact a health care provider if: °· You keep feeling nauseous or you keep vomiting. °· You feel light-headed. °· You develop a rash. °· You  have a fever. °Get help right away if: °· You have trouble breathing. °This information is not intended to replace advice given to you by your health care provider. Make sure you discuss any questions you have with your health care provider. °Document Released: 06/10/2013 Document Revised: 08/02/2017 Document Reviewed: 12/10/2015 °Elsevier Patient Education © 2020 Elsevier Inc. ° °
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
## 2019-03-26 ENCOUNTER — Ambulatory Visit: Payer: Medicare HMO | Admitting: Family

## 2019-03-26 ENCOUNTER — Encounter (HOSPITAL_COMMUNITY): Payer: Medicare HMO

## 2019-03-31 ENCOUNTER — Other Ambulatory Visit: Payer: Self-pay

## 2019-03-31 ENCOUNTER — Inpatient Hospital Stay (HOSPITAL_COMMUNITY): Payer: Medicare HMO | Attending: Hematology | Admitting: Hematology

## 2019-03-31 ENCOUNTER — Encounter (HOSPITAL_COMMUNITY): Payer: Self-pay | Admitting: Hematology

## 2019-03-31 DIAGNOSIS — C7951 Secondary malignant neoplasm of bone: Secondary | ICD-10-CM | POA: Insufficient documentation

## 2019-03-31 DIAGNOSIS — Z8619 Personal history of other infectious and parasitic diseases: Secondary | ICD-10-CM | POA: Insufficient documentation

## 2019-03-31 DIAGNOSIS — F17210 Nicotine dependence, cigarettes, uncomplicated: Secondary | ICD-10-CM | POA: Insufficient documentation

## 2019-03-31 DIAGNOSIS — C220 Liver cell carcinoma: Secondary | ICD-10-CM | POA: Diagnosis present

## 2019-03-31 DIAGNOSIS — C7972 Secondary malignant neoplasm of left adrenal gland: Secondary | ICD-10-CM | POA: Diagnosis present

## 2019-03-31 DIAGNOSIS — I4891 Unspecified atrial fibrillation: Secondary | ICD-10-CM | POA: Diagnosis not present

## 2019-03-31 DIAGNOSIS — Z79899 Other long term (current) drug therapy: Secondary | ICD-10-CM | POA: Diagnosis not present

## 2019-03-31 DIAGNOSIS — R319 Hematuria, unspecified: Secondary | ICD-10-CM | POA: Diagnosis not present

## 2019-03-31 DIAGNOSIS — I10 Essential (primary) hypertension: Secondary | ICD-10-CM | POA: Insufficient documentation

## 2019-03-31 DIAGNOSIS — Z7982 Long term (current) use of aspirin: Secondary | ICD-10-CM | POA: Insufficient documentation
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
## 2019-03-31 NOTE — Patient Instructions (Addendum)
[facility] at [facility] Discharge Instructions  You were seen today by Dr. Delton Coombes. He went over your recent biopsy results. He would like you to have another biopsy to further evaluate you. He will see you back after the biopsy for follow up.   Thank you for choosing [place] at [facility] to provide your oncology and hematology care.  To afford each patient quality time with our provider, please arrive at least 15 minutes before your scheduled appointment time.   If you have a lab appointment with the [facility] please come in thru the  Main Entrance and check in at the main information desk  You need to re-schedule your appointment should you arrive 10 or more minutes late.  We strive to give you quality time with our providers, and arriving late affects you and other patients whose appointments are after yours.  Also, if you no show three or more times for appointments you may be dismissed from the clinic at the providers discretion.     Again, thank you for choosing [facility].  Our hope is that these requests will decrease the amount of time that you wait before being seen by our physicians.       _____________________________________________________________  Should you have questions after your visit to [facility], please contact our office at [phone] between the hours of 8:00 a.m. and 4:30 p.m.  Voicemails left after 4:00 p.m. will not be returned until the following business day.  For prescription refill requests, have your pharmacy contact our office and allow 72 hours.    Cancer Center Support Programs:   > Cancer Support Group  2nd Tuesday of the month 1pm-2pm, Journey Room

## 2019-03-31 NOTE — Assessment & Plan Note (Signed)
1.  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  Hypermetabolic left adrenal mass most likely metastatic or primary.  No extra adrenal soft tissue primary identified.  Cirrhosis with no evidence of hypermetabolic HCC. - CT-guided biopsy on 03/25/2019 shows completely necrotic tissue of the sacral mass. - I will reach out to Dr. Earleen Newport to see if there is any other lesion that could successfully and safely be biopsied. -I will see him back after the biopsy.  2.  HCC: -He had a history of segment 5 HCC, measuring 2.4 cm in the right hepatic lobe, status post microwave ablation on 03/15/2017.  Most recent AFP was normal. -Previous history of hep C, treated with Harvoni, reportedly cured.  3.  Hematuria: -he has an indwelling Foley catheter from ER visit on 02/05/2019 for urinary retention. - Hematuria subsided after he stopped taking Xarelto.  He has an appointment to see urology.

## 2019-04-02 ENCOUNTER — Other Ambulatory Visit (HOSPITAL_COMMUNITY): Payer: Self-pay | Admitting: Hematology

## 2019-04-02 DIAGNOSIS — E278 Other specified disorders of adrenal gland: Secondary | ICD-10-CM

## 2019-04-08 ENCOUNTER — Ambulatory Visit (INDEPENDENT_AMBULATORY_CARE_PROVIDER_SITE_OTHER): Payer: Medicare HMO | Admitting: Urology

## 2019-04-08 DIAGNOSIS — R338 Other retention of urine: Secondary | ICD-10-CM

## 2019-04-09 ENCOUNTER — Other Ambulatory Visit: Payer: Self-pay | Admitting: Cardiovascular Disease

## 2019-04-15 ENCOUNTER — Other Ambulatory Visit: Payer: Self-pay | Admitting: Radiology

## 2019-04-15 ENCOUNTER — Ambulatory Visit (INDEPENDENT_AMBULATORY_CARE_PROVIDER_SITE_OTHER): Payer: Medicare HMO | Admitting: Urology

## 2019-04-15 ENCOUNTER — Other Ambulatory Visit: Payer: Self-pay

## 2019-04-15 DIAGNOSIS — R338 Other retention of urine: Secondary | ICD-10-CM

## 2019-04-16 ENCOUNTER — Encounter (HOSPITAL_COMMUNITY): Payer: Self-pay

## 2019-04-16 ENCOUNTER — Observation Stay (HOSPITAL_COMMUNITY)
Admission: EM | Admit: 2019-04-16 | Discharge: 2019-04-18 | Disposition: A | Discharge status: Home / Self Care | Payer: Medicare HMO | Attending: Internal Medicine | Admitting: Internal Medicine

## 2019-04-16 ENCOUNTER — Ambulatory Visit (HOSPITAL_COMMUNITY)
Admission: RE | Admit: 2019-04-16 | Discharge: 2019-04-16 | Disposition: A | Discharge status: Home / Self Care | Payer: Medicare HMO | Source: Ambulatory Visit | Attending: Hematology | Admitting: Hematology

## 2019-04-16 ENCOUNTER — Other Ambulatory Visit: Payer: Self-pay

## 2019-04-16 ENCOUNTER — Emergency Department (HOSPITAL_COMMUNITY): Payer: Medicare HMO

## 2019-04-16 ENCOUNTER — Encounter (HOSPITAL_COMMUNITY): Payer: Self-pay | Admitting: Pharmacy Technician

## 2019-04-16 DIAGNOSIS — Z7982 Long term (current) use of aspirin: Secondary | ICD-10-CM | POA: Diagnosis not present

## 2019-04-16 DIAGNOSIS — I4891 Unspecified atrial fibrillation: Principal | ICD-10-CM | POA: Insufficient documentation

## 2019-04-16 DIAGNOSIS — Z20828 Contact with and (suspected) exposure to other viral communicable diseases: Secondary | ICD-10-CM | POA: Insufficient documentation

## 2019-04-16 DIAGNOSIS — Z950 Presence of cardiac pacemaker: Secondary | ICD-10-CM | POA: Diagnosis not present

## 2019-04-16 DIAGNOSIS — E278 Other specified disorders of adrenal gland: Secondary | ICD-10-CM | POA: Insufficient documentation

## 2019-04-16 DIAGNOSIS — E279 Disorder of adrenal gland, unspecified: Secondary | ICD-10-CM | POA: Insufficient documentation

## 2019-04-16 DIAGNOSIS — C220 Liver cell carcinoma: Secondary | ICD-10-CM | POA: Diagnosis not present

## 2019-04-16 DIAGNOSIS — Z8505 Personal history of malignant neoplasm of liver: Secondary | ICD-10-CM | POA: Insufficient documentation

## 2019-04-16 DIAGNOSIS — F17210 Nicotine dependence, cigarettes, uncomplicated: Secondary | ICD-10-CM | POA: Insufficient documentation

## 2019-04-16 DIAGNOSIS — I10 Essential (primary) hypertension: Secondary | ICD-10-CM | POA: Diagnosis not present

## 2019-04-16 DIAGNOSIS — Z720 Tobacco use: Secondary | ICD-10-CM | POA: Diagnosis not present

## 2019-04-16 DIAGNOSIS — Z79899 Other long term (current) drug therapy: Secondary | ICD-10-CM | POA: Insufficient documentation

## 2019-04-16 DIAGNOSIS — I480 Paroxysmal atrial fibrillation: Secondary | ICD-10-CM

## 2019-04-16 DIAGNOSIS — R339 Retention of urine, unspecified: Secondary | ICD-10-CM | POA: Diagnosis not present

## 2019-04-16 LAB — SARS CORONAVIRUS 2 (TAT 6-24 HRS): SARS Coronavirus 2: NEGATIVE

## 2019-04-16 LAB — COMPREHENSIVE METABOLIC PANEL
ALT: 114 U/L — ABNORMAL HIGH (ref 0–44)
AST: 27 U/L (ref 15–41)
Albumin: 2.9 g/dL — ABNORMAL LOW (ref 3.5–5.0)
Alkaline Phosphatase: 95 U/L (ref 38–126)
Anion gap: 8 (ref 5–15)
BUN: 12 mg/dL (ref 8–23)
CO2: 25 mmol/L (ref 22–32)
Calcium: 8.4 mg/dL — ABNORMAL LOW (ref 8.9–10.3)
Chloride: 106 mmol/L (ref 98–111)
Creatinine, Ser: 0.92 mg/dL (ref 0.61–1.24)
GFR calc Af Amer: >60 mL/min (ref >60)
GFR calc non Af Amer: >60 mL/min (ref >60)
Glucose, Bld: 105 mg/dL — ABNORMAL HIGH (ref 70–99)
Potassium: 4.4 mmol/L (ref 3.5–5.1)
Sodium: 139 mmol/L (ref 135–145)
Total Bilirubin: 0.6 mg/dL (ref 0.3–1.2)
Total Protein: 6.4 g/dL — ABNORMAL LOW (ref 6.5–8.1)

## 2019-04-16 LAB — PROTIME-INR
INR: 1.3 — ABNORMAL HIGH (ref 0.8–1.2)
Prothrombin Time: 15.7 seconds — ABNORMAL HIGH (ref 11.4–15.2)

## 2019-04-16 LAB — CBC
HCT: 48.4 % (ref 39.0–52.0)
Hemoglobin: 16.2 g/dL (ref 13.0–17.0)
MCH: 35.7 pg — ABNORMAL HIGH (ref 26.0–34.0)
MCHC: 33.5 g/dL (ref 30.0–36.0)
MCV: 106.6 fL — ABNORMAL HIGH (ref 80.0–100.0)
Platelets: 272 10*3/uL (ref 150–400)
RBC: 4.54 MIL/uL (ref 4.22–5.81)
RDW: 13.2 % (ref 11.5–15.5)
WBC: 9.5 10*3/uL (ref 4.0–10.5)
nRBC: 0 % (ref 0.0–0.2)

## 2019-04-16 LAB — CBC WITH DIFFERENTIAL/PLATELET
Abs Immature Granulocytes: 0.02 10*3/uL (ref 0.00–0.07)
Basophils Absolute: 0.1 10*3/uL (ref 0.0–0.1)
Basophils Relative: 1 %
Eosinophils Absolute: 0.1 10*3/uL (ref 0.0–0.5)
Eosinophils Relative: 2 %
HCT: 43.3 % (ref 39.0–52.0)
Hemoglobin: 14.3 g/dL (ref 13.0–17.0)
Immature Granulocytes: 0 %
Lymphocytes Relative: 20 %
Lymphs Abs: 1.7 10*3/uL (ref 0.7–4.0)
MCH: 35.1 pg — ABNORMAL HIGH (ref 26.0–34.0)
MCHC: 33 g/dL (ref 30.0–36.0)
MCV: 106.4 fL — ABNORMAL HIGH (ref 80.0–100.0)
Monocytes Absolute: 0.7 10*3/uL (ref 0.1–1.0)
Monocytes Relative: 9 %
Neutro Abs: 5.9 10*3/uL (ref 1.7–7.7)
Neutrophils Relative %: 68 %
Platelets: 253 10*3/uL (ref 150–400)
RBC: 4.07 MIL/uL — ABNORMAL LOW (ref 4.22–5.81)
RDW: 13 % (ref 11.5–15.5)
WBC: 8.6 10*3/uL (ref 4.0–10.5)
nRBC: 0 % (ref 0.0–0.2)

## 2019-04-16 MED ORDER — ASPIRIN EC 81 MG PO TBEC
81.0000 mg | DELAYED_RELEASE_TABLET | Freq: Every day | ORAL | Status: DC
  Administered 2019-04-17 – 2019-04-18 (×2): 81 mg via ORAL
  Filled 2019-04-16 (×2): qty 1

## 2019-04-16 MED ORDER — SODIUM CHLORIDE 0.9 % IV BOLUS
1000.0000 mL | Freq: Once | INTRAVENOUS | Status: AC
  Administered 2019-04-16: 1000 mL via INTRAVENOUS

## 2019-04-16 MED ORDER — ONDANSETRON HCL 4 MG PO TABS: 4.0000 mg | ORAL_TABLET | Freq: Four times a day (QID) | ORAL | Status: DC | PRN

## 2019-04-16 MED ORDER — PANTOPRAZOLE SODIUM 40 MG PO TBEC
40.0000 mg | DELAYED_RELEASE_TABLET | Freq: Every day | ORAL | Status: DC
  Administered 2019-04-17 – 2019-04-18 (×2): 40 mg via ORAL
  Filled 2019-04-16 (×2): qty 1

## 2019-04-16 MED ORDER — SODIUM CHLORIDE 0.9% FLUSH
3.0000 mL | Freq: Two times a day (BID) | INTRAVENOUS | Status: DC
  Administered 2019-04-16 – 2019-04-18 (×4): 3 mL via INTRAVENOUS

## 2019-04-16 MED ORDER — LIDOCAINE HCL 1 % IJ SOLN
INTRAMUSCULAR | Status: AC
  Filled 2019-04-16: qty 20

## 2019-04-16 MED ORDER — SODIUM CHLORIDE 0.9 % IV SOLN: INTRAVENOUS | Status: DC

## 2019-04-16 MED ORDER — MONTELUKAST SODIUM 10 MG PO TABS
10.0000 mg | ORAL_TABLET | Freq: Every day | ORAL | Status: DC
  Administered 2019-04-17 – 2019-04-18 (×2): 10 mg via ORAL
  Filled 2019-04-16 (×2): qty 1

## 2019-04-16 MED ORDER — ALBUTEROL SULFATE HFA 108 (90 BASE) MCG/ACT IN AERS: 1.0000 | INHALATION_SPRAY | Freq: Four times a day (QID) | RESPIRATORY_TRACT | Status: DC | PRN

## 2019-04-16 MED ORDER — DILTIAZEM LOAD VIA INFUSION
10.0000 mg | Freq: Once | INTRAVENOUS | Status: AC
  Administered 2019-04-16: 10 mg via INTRAVENOUS
  Filled 2019-04-16: qty 10

## 2019-04-16 MED ORDER — DILTIAZEM HCL-DEXTROSE 100-5 MG/100ML-% IV SOLN (PREMIX)
5.0000 mg/h | INTRAVENOUS | Status: DC
  Administered 2019-04-16: 5 mg/h via INTRAVENOUS
  Administered 2019-04-17: 10 mg/h via INTRAVENOUS
  Filled 2019-04-16 (×3): qty 100

## 2019-04-16 MED ORDER — ENOXAPARIN SODIUM 40 MG/0.4ML ~~LOC~~ SOLN
40.0000 mg | SUBCUTANEOUS | Status: DC
  Administered 2019-04-16 – 2019-04-17 (×2): 40 mg via SUBCUTANEOUS
  Filled 2019-04-16 (×2): qty 0.4

## 2019-04-16 MED ORDER — ONDANSETRON HCL 4 MG/2ML IJ SOLN: 4.0000 mg | Freq: Four times a day (QID) | INTRAMUSCULAR | Status: DC | PRN

## 2019-04-16 MED ORDER — ACETAMINOPHEN 650 MG RE SUPP: 650.0000 mg | Freq: Four times a day (QID) | RECTAL | Status: DC | PRN

## 2019-04-16 MED ORDER — ATENOLOL 25 MG PO TABS
50.0000 mg | ORAL_TABLET | Freq: Every day | ORAL | Status: DC
  Administered 2019-04-17 – 2019-04-18 (×2): 50 mg via ORAL
  Filled 2019-04-16 (×2): qty 2

## 2019-04-16 MED ORDER — FOLIC ACID 1 MG PO TABS
1600.0000 ug | ORAL_TABLET | Freq: Every day | ORAL | Status: DC
  Administered 2019-04-17 – 2019-04-18 (×2): 1.5 mg via ORAL
  Filled 2019-04-16 (×2): qty 2

## 2019-04-16 MED ORDER — ALBUTEROL SULFATE (2.5 MG/3ML) 0.083% IN NEBU: 2.5000 mg | INHALATION_SOLUTION | Freq: Four times a day (QID) | RESPIRATORY_TRACT | Status: DC

## 2019-04-16 MED ORDER — ALFUZOSIN HCL ER 10 MG PO TB24
10.0000 mg | ORAL_TABLET | Freq: Every day | ORAL | Status: DC
  Administered 2019-04-17 – 2019-04-18 (×2): 10 mg via ORAL
  Filled 2019-04-16 (×2): qty 1

## 2019-04-16 MED ORDER — ACETAMINOPHEN 325 MG PO TABS: 650.0000 mg | ORAL_TABLET | Freq: Four times a day (QID) | ORAL | Status: DC | PRN

## 2019-04-16 NOTE — Progress Notes (Addendum)
Patient arrived to IR and placed on monitor. In IR patient HR 140-160 with irregular rhythm. Patient with history of RVR. Stat ECG ordered. Patient in afib rvr but asyptomatic. Procedure cancelled. Patient taken to emergency department RM 24. Transfer to ED coordinated with Ophelia Charter, RN.

## 2019-04-16 NOTE — ED Triage Notes (Signed)
Pt brought to the ed from IR where he was supposed to have an adrenal mass biopsy done. When hooked up to the monitor, pt found to be in afib RVR. Takes cardizem daily. Has been off of xarelto for a month due to CA tx. 90/68, HR 140. Pt in NAD.
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
## 2019-04-16 NOTE — Progress Notes (Signed)
   Pt was scheduled today for adrenal gland mass biopsy  Known afib-- taking Cardizem and Atenolol Has been compliant per pt [facility] in [facility]  On monitor pt HR in 150-130s  Discussed with Dr Kathlene Cote He spoke to pt regarding danger of biopsy and sedation at this time  Will rescheduled biopsy for different day-- schedulers aware  Send to ED now for evaluation and cardio consult  I spoke to [facility] She is aware consult plan
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
## 2019-04-17 ENCOUNTER — Encounter (HOSPITAL_COMMUNITY): Payer: Self-pay | Admitting: General Practice

## 2019-04-17 DIAGNOSIS — I10 Essential (primary) hypertension: Secondary | ICD-10-CM | POA: Diagnosis not present

## 2019-04-17 DIAGNOSIS — R339 Retention of urine, unspecified: Secondary | ICD-10-CM | POA: Diagnosis not present

## 2019-04-17 DIAGNOSIS — C220 Liver cell carcinoma: Secondary | ICD-10-CM | POA: Diagnosis not present

## 2019-04-17 DIAGNOSIS — I480 Paroxysmal atrial fibrillation: Secondary | ICD-10-CM | POA: Diagnosis not present

## 2019-04-17 DIAGNOSIS — I4891 Unspecified atrial fibrillation: Secondary | ICD-10-CM | POA: Diagnosis not present

## 2019-04-17 DIAGNOSIS — E278 Other specified disorders of adrenal gland: Secondary | ICD-10-CM | POA: Diagnosis not present

## 2019-04-17 LAB — CBC
HCT: 39.4 % (ref 39.0–52.0)
Hemoglobin: 13.2 g/dL (ref 13.0–17.0)
MCH: 34.9 pg — ABNORMAL HIGH (ref 26.0–34.0)
MCHC: 33.5 g/dL (ref 30.0–36.0)
MCV: 104.2 fL — ABNORMAL HIGH (ref 80.0–100.0)
Platelets: 251 10*3/uL (ref 150–400)
RBC: 3.78 MIL/uL — ABNORMAL LOW (ref 4.22–5.81)
RDW: 13.1 % (ref 11.5–15.5)
WBC: 10.4 10*3/uL (ref 4.0–10.5)
nRBC: 0 % (ref 0.0–0.2)

## 2019-04-17 LAB — BASIC METABOLIC PANEL
Anion gap: 7 (ref 5–15)
BUN: 10 mg/dL (ref 8–23)
CO2: 23 mmol/L (ref 22–32)
Calcium: 8.3 mg/dL — ABNORMAL LOW (ref 8.9–10.3)
Chloride: 105 mmol/L (ref 98–111)
Creatinine, Ser: 0.81 mg/dL (ref 0.61–1.24)
GFR calc Af Amer: >60 mL/min (ref >60)
GFR calc non Af Amer: >60 mL/min (ref >60)
Glucose, Bld: 98 mg/dL (ref 70–99)
Potassium: 3.8 mmol/L (ref 3.5–5.1)
Sodium: 135 mmol/L (ref 135–145)

## 2019-04-17 LAB — MAGNESIUM: Magnesium: 1.6 mg/dL — ABNORMAL LOW (ref 1.7–2.4)

## 2019-04-17 MED ORDER — GUAIFENESIN 100 MG/5ML PO SOLN
5.0000 mL | ORAL | Status: DC | PRN
  Administered 2019-04-18: 100 mg via ORAL
  Filled 2019-04-17: qty 5

## 2019-04-17 MED ORDER — MAGNESIUM SULFATE 2 GM/50ML IV SOLN
2.0000 g | Freq: Once | INTRAVENOUS | Status: AC
  Administered 2019-04-17: 2 g via INTRAVENOUS
  Filled 2019-04-17: qty 50

## 2019-04-17 MED ORDER — DILTIAZEM HCL ER COATED BEADS 180 MG PO CP24
360.0000 mg | ORAL_CAPSULE | Freq: Every day | ORAL | Status: DC
  Administered 2019-04-18: 11:00:00 360 mg via ORAL
  Filled 2019-04-17 (×2): qty 2

## 2019-04-17 MED ORDER — ALBUTEROL SULFATE (2.5 MG/3ML) 0.083% IN NEBU: 2.5000 mg | INHALATION_SOLUTION | Freq: Four times a day (QID) | RESPIRATORY_TRACT | Status: DC | PRN

## 2019-04-17 NOTE — Care Management Obs Status (Signed)
[place] NOTIFICATION   Patient Details  Name: BRITTIAN RENALDO MRN: [id number] Date of Birth: 1955-10-04   Medicare Observation Status Notification Given:  Yes    Bethena Roys, RN 04/17/2019, 4:48 PM

## 2019-04-17 NOTE — Progress Notes (Signed)
Patient ID: Alejandro Rice, male   DOB: 12-04-1955, 62 y.o.   MRN: [id number]  PROGRESS NOTE    Alejandro Rice  WFU:[id number] DOB: 10/30/1955 DOA: 04/16/2019 PCP: Rogers Blocker, MD   Brief Narrative:  63 year old male with history of persistent A. 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 Delton Coombes who is scheduled for elective biopsy on 04/16/2019 by IR but was found to have A. fib with heart rates into 140s to 160s.  He was subsequently admitted and started on Cardizem drip.  Cardiology was consulted.  Assessment & Plan:   Persistent A. fib with RVR -Currently on Cardizem drip.  Cardiology following.  Still tachycardic.  Continue atenolol. -Not on anticoagulation/Xarelto since June 2020 due to hematuria  History of recent urinary retention -Patient had a Foley catheter which was recently discontinued by urology a day prior to presentation -Patient states that he is not having any problems with urination currently -Outpatient follow-up with urology  Hypomagnesemia -Replace.  Repeat a.m. labs  Left adrenal mass with metastatic bony lesions -Patient was supposed to have IR guided biopsy on 04/16/2019 which had to be postponed.  This will have to be arranged as an outpatient upon discharge.  Outpatient follow-up with Dr. Delton Coombes.  History of sick sinus syndrome status post pacemaker -Follows up with Dr. Lovena Le  History of hepatocellular carcinoma/history of hepatitis C -History of progressed see treatment status post Harvoni treatment. -Hepatocellular carcinoma measuring up to 2.4 cm in the right hepatic lobe status post microwave ablation in 03/2017 and most recent AFP was noted to be within normal limits.  Outpatient follow-up  Tobacco abuse -Patient needs to stop smoking.    DVT prophylaxis: Lovenox Code Status: Full Family Communication: None at bedside Disposition Plan: Home in  1 to 2 days once clinically improved and once cleared by cardiology.  Consultants: Cardiology  Procedures: None  Antimicrobials: None   Subjective: Patient seen and examined at bedside.  He denies any current chest pain or palpitations.  No overnight fever, nausea or vomiting.  Objective: Vitals:   04/17/19 0735 04/17/19 0805 04/17/19 0835 04/17/19 0933  BP: 102/83 106/89 93/80 121/89  Pulse: (!) 152 (!) 137 (!) 126 (!) 105  Resp: (!) 27  19   Temp:      TempSrc:      SpO2: 94% 93% 94%   Weight:      Height:        Intake/Output Summary (Last 24 hours) at 04/17/2019 0959 Last data filed at 04/17/2019 0500 Gross per 24 hour  Intake 243 ml  Output 850 ml  Net -607 ml   Filed Weights   04/17/19 0503  Weight: 65.5 kg    Examination:  General exam: Appears calm and comfortable.  No distress Respiratory system: Bilateral decreased breath sounds at bases Cardiovascular system: S1 & S2 heard, tachycardic  gastrointestinal system: Abdomen is nondistended, soft and nontender. Normal bowel sounds heard. Extremities: No cyanosis, clubbing, edema    Data Reviewed: I have personally reviewed following labs and imaging studies  CBC: Recent Labs  Lab 04/16/19 0944 04/16/19 1218 04/17/19 0427  WBC 9.5 8.6 10.4  NEUTROABS  --  5.9  --   HGB 16.2 14.3 13.2  HCT 48.4 43.3 39.4  MCV 106.6* 106.4* 104.2*  PLT 272 253 202   Basic Metabolic Panel: Recent Labs  Lab 04/16/19 1218 04/17/19 0427  NA 139 135  K 4.4 3.8  CL 106 105  CO2 25 23  GLUCOSE 105* 98  BUN 12 10  CREATININE 0.92 0.81  CALCIUM 8.4* 8.3*  MG  --  1.6*   GFR: Estimated Creatinine Clearance: 86.5 mL/min (by C-G formula based on SCr of 0.81 mg/dL). Liver Function Tests: Recent Labs  Lab 04/16/19 1218  AST 27  ALT 114*  ALKPHOS 95  BILITOT 0.6  PROT 6.4*  ALBUMIN 2.9*   No results for input(s): LIPASE, AMYLASE in the last 168 hours. No results for input(s): AMMONIA in the last 168 hours.  Coagulation Profile: Recent Labs  Lab 04/16/19 0944  INR 1.3*   Cardiac Enzymes: No results for input(s): CKTOTAL, CKMB, CKMBINDEX, TROPONINI in the last 168 hours. BNP (last 3 results) No results for input(s): PROBNP in the last 8760 hours. HbA1C: No results for input(s): HGBA1C in the last 72 hours. CBG: No results for input(s): GLUCAP in the last 168 hours. Lipid Profile: No results for input(s): CHOL, HDL, LDLCALC, TRIG, CHOLHDL, LDLDIRECT in the last 72 hours. Thyroid Function Tests: No results for input(s): TSH, T4TOTAL, FREET4, T3FREE, THYROIDAB in the last 72 hours. Anemia Panel: No results for input(s): VITAMINB12, FOLATE, FERRITIN, TIBC, IRON, RETICCTPCT in the last 72 hours. Sepsis Labs: No results for input(s): PROCALCITON, LATICACIDVEN in the last 168 hours.  Recent Results (from the past 240 hour(s))  SARS CORONAVIRUS 2 Nasal Swab Aptima Multi Swab     Status: None   Collection Time: 04/16/19  1:12 PM   Specimen: Aptima Multi Swab; Nasal Swab  Result Value Ref Range Status   SARS Coronavirus 2 NEGATIVE NEGATIVE Final    Comment: (NOTE) SARS-CoV-2 target nucleic acids are NOT DETECTED. The SARS-CoV-2 RNA is generally detectable in upper and lower respiratory specimens during the acute phase of infection. Negative results do not preclude SARS-CoV-2 infection, do not rule out co-infections with other pathogens, and should not be used as the sole basis for treatment or other patient management decisions. Negative results must be combined with clinical observations, patient history, and epidemiological information. The expected result is Negative. Fact Sheet for Patients: SugarRoll.be Fact Sheet for Healthcare Providers: https://www.woods-mathews.com/ This test is not yet approved or cleared by the [place] FDA and  has been authorized for detection and/or diagnosis of SARS-CoV-2 by FDA under an Emergency Use  Authorization (EUA). This EUA will remain  in effect (meaning this test can be used) for the duration of the COVID-19 declaration under Section 56 4(b)(1) of the Act, 21 U.S.C. section 360bbb-3(b)(1), unless the authorization is terminated or revoked sooner. Performed at [facility], [place] [address] [place], [place] [address]          Radiology Studies: Dg Chest Portable 1 View  Result Date: 04/16/2019 CLINICAL DATA:  Atrial fibrillation with rapid ventricular response. EXAM: PORTABLE CHEST 1 VIEW COMPARISON:  09/24/2018 prior radiographs FINDINGS: Cardiomediastinal silhouette is unchanged. A LEFT-sided pacemaker again identified. Hyperinflation again noted. There is no evidence of focal airspace disease, pulmonary edema, suspicious pulmonary nodule/mass, pleural effusion, or pneumothorax. No acute bony abnormalities are identified. IMPRESSION: No evidence of acute cardiopulmonary disease. Electronically Signed   By: Margarette Canada M.D.   On: 04/16/2019 12:51        Scheduled Meds: . albuterol  2.5 mg Nebulization Q6H  . alfuzosin  10 mg Oral Q breakfast  . aspirin EC  81 mg Oral Daily  . atenolol  50 mg Oral Daily  . enoxaparin (LOVENOX) injection  40 mg Subcutaneous  V69I  . folic acid  5,038 mcg Oral Daily  . montelukast  10 mg Oral Daily  . pantoprazole  40 mg Oral Daily  . sodium chloride flush  3 mL Intravenous Q12H   Continuous Infusions: . diltiazem (CARDIZEM) infusion 15 mg/hr (04/17/19 0742)     LOS: 0 days        Aline August, MD [facility] 04/17/2019, 9:59 AM

## 2019-04-17 NOTE — Progress Notes (Signed)
Progress Note  Patient Name: Alejandro Rice Date of Encounter: 04/17/2019  Primary Cardiologist: Jenkins Rouge, MD Dr. Lovena Le  Subjective   Overall with Foley catheter, no chest pain  Inpatient Medications    Scheduled Meds: . albuterol  2.5 mg Nebulization Q6H  . alfuzosin  10 mg Oral Q breakfast  . aspirin EC  81 mg Oral Daily  . atenolol  50 mg Oral Daily  . enoxaparin (LOVENOX) injection  40 mg Subcutaneous Q24H  . folic acid  2,952 mcg Oral Daily  . montelukast  10 mg Oral Daily  . pantoprazole  40 mg Oral Daily  . sodium chloride flush  3 mL Intravenous Q12H   Continuous Infusions: . diltiazem (CARDIZEM) infusion 15 mg/hr (04/17/19 0742)   PRN Meds: acetaminophen **OR** acetaminophen, ondansetron **OR** ondansetron (ZOFRAN) IV   Vital Signs    Vitals:   04/17/19 0735 04/17/19 0805 04/17/19 0835 04/17/19 0933  BP: 102/83 106/89 93/80 121/89  Pulse: (!) 152 (!) 137 (!) 126 (!) 105  Resp: (!) 27  19   Temp:      TempSrc:      SpO2: 94% 93% 94%   Weight:      Height:        Intake/Output Summary (Last 24 hours) at 04/17/2019 1042 Last data filed at 04/17/2019 0500 Gross per 24 hour  Intake 243 ml  Output 850 ml  Net -607 ml   Last 3 Weights 04/17/2019 04/16/2019 03/31/2019  Weight (lbs) 144 lb 6.4 oz 149 lb 11.2 oz 149 lb 6 oz  Weight (kg) 65.5 kg 67.903 kg 67.756 kg      Telemetry    Currently sinus rhythm A paced- Personally Reviewed  ECG    EKG yesterday with atrial fibrillation rapid ventricular response 137 bpm- Personally Reviewed  Physical Exam   GEN: No acute distress.   Neck: No JVD Cardiac: RRR, no murmurs, rubs, or gallops.  Respiratory: Clear to auscultation bilaterally. GI: Soft, nontender, non-distended  MS: No edema; BKA. Neuro:  Nonfocal  Psych: Normal affect   Labs    High Sensitivity Troponin:  No results for input(s): TROPONINIHS in the last 720 hours.    Cardiac EnzymesNo results for input(s): TROPONINI in the last 168  hours. No results for input(s): TROPIPOC in the last 168 hours.   Chemistry Recent Labs  Lab 04/16/19 1218 04/17/19 0427  NA 139 135  K 4.4 3.8  CL 106 105  CO2 25 23  GLUCOSE 105* 98  BUN 12 10  CREATININE 0.92 0.81  CALCIUM 8.4* 8.3*  PROT 6.4*  --   ALBUMIN 2.9*  --   AST 27  --   ALT 114*  --   ALKPHOS 95  --   BILITOT 0.6  --   GFRNONAA >60 >60  GFRAA >60 >60  ANIONGAP 8 7     Hematology Recent Labs  Lab 04/16/19 0944 04/16/19 1218 04/17/19 0427  WBC 9.5 8.6 10.4  RBC 4.54 4.07* 3.78*  HGB 16.2 14.3 13.2  HCT 48.4 43.3 39.4  MCV 106.6* 106.4* 104.2*  MCH 35.7* 35.1* 34.9*  MCHC 33.5 33.0 33.5  RDW 13.2 13.0 13.1  PLT 272 253 251    BNPNo results for input(s): BNP, PROBNP in the last 168 hours.   DDimer No results for input(s): DDIMER in the last 168 hours.   Radiology    Dg Chest Portable 1 View  Result Date: 04/16/2019 CLINICAL DATA:  Atrial fibrillation with rapid ventricular  response. EXAM: PORTABLE CHEST 1 VIEW COMPARISON:  09/24/2018 prior radiographs FINDINGS: Cardiomediastinal silhouette is unchanged. A LEFT-sided pacemaker again identified. Hyperinflation again noted. There is no evidence of focal airspace disease, pulmonary edema, suspicious pulmonary nodule/mass, pleural effusion, or pneumothorax. No acute bony abnormalities are identified. IMPRESSION: No evidence of acute cardiopulmonary disease. Electronically Signed   By: Margarette Canada M.D.   On: 04/16/2019 12:51    Cardiac Studies   Echocardiogram 09/2018- normal ejection fraction, pacemaker, estimated pulmonary artery pressures were 60.  Patient Profile     63 y.o. male with permanent atrial fibrillation RVR.  Canceled adrenal mass surgery secondary to rapid ventricular response A. fib.  Dr. Lovena Le patient.  Pacemaker.  Assessment & Plan    Atrial fibrillation persistent with pacemaker and rapid ventricular response - On atenolol -On diltiazem drip 15/h. - This morning converted.   Clearly sinus rhythm on telemetry. -Blood pressure fairly soft.  I will change his diltiazem over to p.o. Cardizem CD 360 mg once a day.  Chronic anticoagulation -He has been off of Xarelto for anticipated surgery and for prior hematuria.  Pacemaker -Functioning well.  Dr. Lovena Le.  Adrenal mass/hepatocellular carcinoma status post resection/cirrhosis/left BKA - Per primary team.  [facility] will sign off.   Medication Recommendations: Continue with atenolol and diltiazem as above. Other recommendations (labs, testing, etc):  none Follow up as an outpatient: As previously scheduled with Dr. Lovena Le  For questions or updates, please contact [facility] Please consult [email or web address] for contact info under        Signed, Candee Furbish, MD  04/17/2019, 10:42 AM

## 2019-04-17 NOTE — Progress Notes (Signed)
Pt currently in nsr w. Paced rhythm. Cardiologist at bedside and aware. ekg being done to confirm. Diltiazem drip d/c'd & new orders for oral diltz. Pt updated. Will continue to monitor the pt Alejandro Rice, [name], RN

## 2019-04-17 NOTE — Progress Notes (Signed)
Bladder scan done and showed 200 cc. Foley cath placed per order with 125 cc output. Patient tolerated well. Will continue to monitor.

## 2019-04-18 DIAGNOSIS — I4821 Permanent atrial fibrillation: Secondary | ICD-10-CM

## 2019-04-18 DIAGNOSIS — I480 Paroxysmal atrial fibrillation: Secondary | ICD-10-CM | POA: Diagnosis not present

## 2019-04-18 LAB — BASIC METABOLIC PANEL
Anion gap: 9 (ref 5–15)
BUN: 6 mg/dL — ABNORMAL LOW (ref 8–23)
CO2: 25 mmol/L (ref 22–32)
Calcium: 8.7 mg/dL — ABNORMAL LOW (ref 8.9–10.3)
Chloride: 104 mmol/L (ref 98–111)
Creatinine, Ser: 0.7 mg/dL (ref 0.61–1.24)
GFR calc Af Amer: >60 mL/min (ref >60)
GFR calc non Af Amer: >60 mL/min (ref >60)
Glucose, Bld: 99 mg/dL (ref 70–99)
Potassium: 3.8 mmol/L (ref 3.5–5.1)
Sodium: 138 mmol/L (ref 135–145)

## 2019-04-18 LAB — MAGNESIUM: Magnesium: 1.8 mg/dL (ref 1.7–2.4)

## 2019-04-18 MED ORDER — DILTIAZEM HCL ER COATED BEADS 360 MG PO CP24: 360.0000 mg | ORAL_CAPSULE | Freq: Every day | ORAL | 0 refills | Status: DC

## 2019-04-18 NOTE — Discharge Summary (Addendum)
Physician Discharge Summary  Alejandro Rice ZOX:[id number] DOB: [birth year] DOA: 04/16/2019  PCP: Rogers Blocker, MD  Admit date: 04/16/2019 Discharge date: 04/18/2019  Admitted From: Home Disposition: Home  Recommendations for Outpatient Follow-up:  1. Follow up with PCP in 1 week with repeat CBC/BMP 2. Outpatient follow-up with cardiology, oncology.  Patient needs to reschedule his biopsy appointment with IR. 3. Follow up in ED if symptoms worsen or new appear 4. Patient needs to stop smoking.   Home Health: No Equipment/Devices: Foley catheter Discharge Condition: Stable CODE STATUS: Full Diet recommendation: Heart healthy  Brief/Interim Summary: 63 year old male with history of persistent A. 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 Delton Coombes who is scheduled for elective biopsy on 04/16/2019 by IR but was found to have A. fib with heart rates into 140s to 160s.  He was subsequently admitted and started on Cardizem drip.  Cardiology was consulted.  During the hospitalization, patient converted to sinus rhythm.  Cardizem was increased to 360 mg once daily.  Patient has remained stable.  Cardiology has signed off.  Patient will be discharged home with outpatient follow-up with cardiology.  Discharge Diagnoses:   Persistent A. fib with RVR -Treated with Cardizem drip.  Cardiology evaluated the patient. - During the hospitalization, patient converted to sinus rhythm.  Cardizem was increased to 360 mg once daily by cardiology.  Patient has remained stable.  Cardiology has signed off.  Patient will be discharged home with outpatient follow-up with cardiology.  Continue atenolol and increased dose of Cardizem on discharge. -Not on anticoagulation/Xarelto since June 2020 due to hematuria  History of recent urinary retention -Patient had a Foley catheter which was recently discontinued by urology a  day prior to presentation -Foley catheter had to be reinserted because of urinary retention.  He will be discharged with Foley catheter. -Outpatient follow-up with urology  Hypomagnesemia -Replaced.  Improved.  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  This will have to be arranged as an outpatient upon discharge.  Outpatient follow-up with Dr. Delton Coombes.  History of sick sinus syndrome status post pacemaker -Follows up with Dr. Lovena Le  History of hepatocellular carcinoma/history of hepatitis C -History of progressed see treatment status post Harvoni treatment. -Hepatocellular carcinoma measuring up to 2.4 cm in the right hepatic lobe status post microwave ablation in 03/2017 and most recent AFP was noted to be within normal limits.  Outpatient follow-up  Tobacco abuse -Patient needs to stop smoking.   Discharge Instructions  Discharge Instructions    Ambulatory referral to Cardiology   Complete by: As directed    Ambulatory referral to Oncology   Complete by: As directed    Diet - low sodium heart healthy   Complete by: As directed    Increase activity slowly   Complete by: As directed      Allergies as of 04/18/2019   No Known Allergies     Medication List    TAKE these medications   albuterol 108 (90 Base) MCG/ACT inhaler Commonly known as: VENTOLIN HFA Inhale 1-2 puffs into the lungs every 6 (six) hours as needed for wheezing or shortness of breath.   alfuzosin 10 MG 24 hr tablet Commonly known as: UROXATRAL Take 10 mg by mouth daily with breakfast.   aspirin 81 MG EC tablet Take 1 tablet (81 mg total) by mouth daily.  atenolol 50 MG tablet Commonly known as: TENORMIN Take 1 tablet (50 mg total) by mouth daily.   diltiazem 360 MG 24 hr capsule Commonly known as: CARDIZEM CD Take 1 capsule (360 mg total) by mouth daily. What changed:   medication  strength  how much to take   folic acid 742 MCG tablet Commonly known as: FOLVITE Take 1,600 mcg by mouth daily.   montelukast 10 MG tablet Commonly known as: SINGULAIR Take 10 mg by mouth daily.   multivitamin with minerals Tabs tablet Take 1 tablet by mouth daily. Adult Multivitamin for Men 50+   omeprazole 20 MG capsule Commonly known as: PRILOSEC TAKE 1 CAPSULE BY MOUTH 30 MINUTES BEFORE BREAKFAST What changed:   how much to take  how to take this  when to take this  additional instructions   Vitamin D 50 MCG (2000 UT) Caps Take 2,000 Units by mouth daily.      Follow-up Information    Rogers Blocker, MD. Schedule an appointment as soon as possible for a visit in 1 week(s).   Specialty: Internal Medicine Why: With repeat CBC/BMP Contact information: [place] [address] [phone]        Josue Hector, MD .   Specialty: Cardiology Contact information: [phone] [address] [place] [address] [phone]        Evans Lance, MD .   Specialty: Cardiology Contact information: [phone] [address] [place] [address] [phone]        Derek Jack, MD. Schedule an appointment as soon as possible for a visit in 1 week(s).   Specialty: Hematology Contact information: [address] [place] [address] [phone]          No Known Allergies  Consultations:  Cardiology   Procedures/Studies: Ct Biopsy  Result Date: 04/04/19 INDICATION: 63 year old male with a history of metastatic carcinoma, with sacral mass EXAM: CT BIOPSY MEDICATIONS: None. ANESTHESIA/SEDATION: No moderate sedation. 50 mcg fentanyl The patient's level of consciousness and vital signs were monitored continuously by radiology nursing throughout the procedure under my direct supervision. FLUOROSCOPY TIME:  CT COMPLICATIONS: None PROCEDURE: Informed written consent was obtained from the patient after a thorough  discussion of the procedural risks, benefits and alternatives. All questions were addressed. Maximal Sterile Barrier Technique was utilized including caps, mask, sterile gowns, sterile gloves, sterile drape, hand hygiene and skin antiseptic. A timeout was performed prior to the initiation of the procedure. Patient position prone position on the CT gantry table. Scout CT images acquired. Using CT imaging, a guide needle, 17 gauge was advanced into the soft tissue mass of the sacrum. Once we confirmed needle position, multiple 18 gauge core biopsy were acquired. Sample placed into formalin. Needle was removed and a sterile bandage was placed. Patient tolerated the procedure well and remained hemodynamically stable throughout. No complications were encountered and no significant blood loss. IMPRESSION: Status post CT-guided biopsy of soft tissue mass involving the sacrum. Signed, Dulcy Fanny. Dellia Nims, RPVI [facility] Electronically Signed   By: Corrie Mckusick D.O.   On: 04-04-2019 15:48   Dg Chest Portable 1 View  Result Date: 04/16/2019 CLINICAL DATA:  Atrial fibrillation with rapid ventricular response. EXAM: PORTABLE CHEST 1 VIEW COMPARISON:  09/24/2018 prior radiographs FINDINGS: Cardiomediastinal silhouette is unchanged. A LEFT-sided pacemaker again identified. Hyperinflation again noted. There is no evidence of focal airspace disease, pulmonary edema, suspicious pulmonary nodule/mass, pleural effusion, or  pneumothorax. No acute bony abnormalities are identified. IMPRESSION: No evidence of acute cardiopulmonary disease. Electronically Signed   By: Margarette Canada M.D.   On: 04/16/2019 12:51       Subjective: Patient seen and examined at bedside.  He feels much better and wants to go home.  No overnight fever, nausea, vomiting or palpitations.  Discharge Exam: Vitals:   04/17/19 2100 04/18/19 0515  BP: 120/89 119/84  Pulse: 77 76  Resp: 20 20   Temp: 98.1 F (36.7 C) 97.9 F (36.6 C)  SpO2: 94% 94%    General: Pt is alert, awake, not in acute distress Cardiovascular: rate controlled, S1/S2 + Respiratory: bilateral decreased breath sounds at bases Abdominal: Soft, NT, ND, bowel sounds + Extremities: no edema, no cyanosis    The results of significant diagnostics from this hospitalization (including imaging, microbiology, ancillary and laboratory) are listed below for reference.     Microbiology: Recent Results (from the past 240 hour(s))  SARS CORONAVIRUS 2 Nasal Swab Aptima Multi Swab     Status: None   Collection Time: 04/16/19  1:12 PM   Specimen: Aptima Multi Swab; Nasal Swab  Result Value Ref Range Status   SARS Coronavirus 2 NEGATIVE NEGATIVE Final    Comment: (NOTE) SARS-CoV-2 target nucleic acids are NOT DETECTED. The SARS-CoV-2 RNA is generally detectable in upper and lower respiratory specimens during the acute phase of infection. Negative results do not preclude SARS-CoV-2 infection, do not rule out co-infections with other pathogens, and should not be used as the sole basis for treatment or other patient management decisions. Negative results must be combined with clinical observations, patient history, and epidemiological information. The expected result is Negative. Fact Sheet for Patients: SugarRoll.be Fact Sheet for Healthcare Providers: https://www.woods-mathews.com/ This test is not yet approved or cleared by the [place] FDA and  has been authorized for detection and/or diagnosis of SARS-CoV-2 by FDA under an Emergency Use Authorization (EUA). This EUA will remain  in effect (meaning this test can be used) for the duration of the COVID-19 declaration under Section 56 4(b)(1) of the Act, 21 U.S.C. section 360bbb-3(b)(1), unless the authorization is terminated or revoked sooner. Performed at [facility], [place] [address], [place],  [place] [address]      Labs: BNP (last 3 results) No results for input(s): BNP in the last 8760 hours. Basic Metabolic Panel: Recent Labs  Lab 04/16/19 1218 04/17/19 0427 04/18/19 0414  NA 139 135 138  K 4.4 3.8 3.8  CL 106 105 104  CO2 25 23 25   GLUCOSE 105* 98 99  BUN 12 10 6*  CREATININE 0.92 0.81 0.70  CALCIUM 8.4* 8.3* 8.7*  MG  --  1.6* 1.8   Liver Function Tests: Recent Labs  Lab 04/16/19 1218  AST 27  ALT 114*  ALKPHOS 95  BILITOT 0.6  PROT 6.4*  ALBUMIN 2.9*   No results for input(s): LIPASE, AMYLASE in the last 168 hours. No results for input(s): AMMONIA in the last 168 hours. CBC: Recent Labs  Lab 04/16/19 0944 04/16/19 1218 04/17/19 0427  WBC 9.5 8.6 10.4  NEUTROABS  --  5.9  --   HGB 16.2 14.3 13.2  HCT 48.4 43.3 39.4  MCV 106.6* 106.4* 104.2*  PLT 272 253 251   Cardiac Enzymes: No results for input(s): CKTOTAL, CKMB, CKMBINDEX, TROPONINI in the last 168 hours. BNP: Invalid input(s): POCBNP CBG: No results for input(s): GLUCAP in the last 168 hours. D-Dimer No results for input(s):  DDIMER in the last 72 hours. Hgb A1c No results for input(s): HGBA1C in the last 72 hours. Lipid Profile No results for input(s): CHOL, HDL, LDLCALC, TRIG, CHOLHDL, LDLDIRECT in the last 72 hours. Thyroid function studies No results for input(s): TSH, T4TOTAL, T3FREE, THYROIDAB in the last 72 hours.  Invalid input(s): FREET3 Anemia work up No results for input(s): VITAMINB12, FOLATE, FERRITIN, TIBC, IRON, RETICCTPCT in the last 72 hours. Urinalysis    Component Value Date/Time   COLORURINE YELLOW 02/05/2019 1340   APPEARANCEUR HAZY (A) 02/05/2019 1340   LABSPEC 1.015 02/05/2019 1340   PHURINE 6.0 02/05/2019 1340   GLUCOSEU NEGATIVE 02/05/2019 1340   HGBUR NEGATIVE 02/05/2019 1340   BILIRUBINUR NEGATIVE 02/05/2019 1340   KETONESUR 5 (A) 02/05/2019 1340   PROTEINUR NEGATIVE 02/05/2019 1340   NITRITE NEGATIVE 02/05/2019 1340   LEUKOCYTESUR NEGATIVE  02/05/2019 1340   Sepsis Labs Invalid input(s): PROCALCITONIN,  WBC,  LACTICIDVEN Microbiology Recent Results (from the past 240 hour(s))  SARS CORONAVIRUS 2 Nasal Swab Aptima Multi Swab     Status: None   Collection Time: 04/16/19  1:12 PM   Specimen: Aptima Multi Swab; Nasal Swab  Result Value Ref Range Status   SARS Coronavirus 2 NEGATIVE NEGATIVE Final    Comment: (NOTE) SARS-CoV-2 target nucleic acids are NOT DETECTED. The SARS-CoV-2 RNA is generally detectable in upper and lower respiratory specimens during the acute phase of infection. Negative results do not preclude SARS-CoV-2 infection, do not rule out co-infections with other pathogens, and should not be used as the sole basis for treatment or other patient management decisions. Negative results must be combined with clinical observations, patient history, and epidemiological information. The expected result is Negative. Fact Sheet for Patients: SugarRoll.be Fact Sheet for Healthcare Providers: https://www.woods-mathews.com/ This test is not yet approved or cleared by the [place] FDA and  has been authorized for detection and/or diagnosis of SARS-CoV-2 by FDA under an Emergency Use Authorization (EUA). This EUA will remain  in effect (meaning this test can be used) for the duration of the COVID-19 declaration under Section 56 4(b)(1) of the Act, 21 U.S.C. section 360bbb-3(b)(1), unless the authorization is terminated or revoked sooner. Performed at [facility], [place] [address], [place], [place] [address]      Time coordinating discharge: 35 minutes  SIGNED:   Aline August, MD  [facility] 04/18/2019, 9:46 AM

## 2019-04-23 ENCOUNTER — Ambulatory Visit (HOSPITAL_COMMUNITY): Payer: Medicare HMO | Admitting: Hematology

## 2019-05-01 ENCOUNTER — Encounter (HOSPITAL_COMMUNITY): Payer: Medicare HMO

## 2019-05-01 ENCOUNTER — Ambulatory Visit: Payer: Medicare HMO | Admitting: Family

## 2019-05-07 ENCOUNTER — Other Ambulatory Visit: Payer: Self-pay | Admitting: Physician Assistant

## 2019-05-08 ENCOUNTER — Other Ambulatory Visit: Payer: Self-pay

## 2019-05-08 ENCOUNTER — Encounter (HOSPITAL_COMMUNITY): Payer: Self-pay

## 2019-05-08 ENCOUNTER — Ambulatory Visit (HOSPITAL_COMMUNITY)
Admission: RE | Admit: 2019-05-08 | Discharge: 2019-05-08 | Disposition: A | Discharge status: Home / Self Care | Payer: Medicare HMO | Source: Ambulatory Visit | Attending: Hematology | Admitting: Hematology

## 2019-05-08 DIAGNOSIS — C7951 Secondary malignant neoplasm of bone: Secondary | ICD-10-CM | POA: Insufficient documentation

## 2019-05-08 DIAGNOSIS — Z950 Presence of cardiac pacemaker: Secondary | ICD-10-CM | POA: Diagnosis not present

## 2019-05-08 DIAGNOSIS — I480 Paroxysmal atrial fibrillation: Secondary | ICD-10-CM | POA: Diagnosis not present

## 2019-05-08 DIAGNOSIS — Z8619 Personal history of other infectious and parasitic diseases: Secondary | ICD-10-CM | POA: Diagnosis not present

## 2019-05-08 DIAGNOSIS — I10 Essential (primary) hypertension: Secondary | ICD-10-CM | POA: Insufficient documentation

## 2019-05-08 DIAGNOSIS — J449 Chronic obstructive pulmonary disease, unspecified: Secondary | ICD-10-CM | POA: Diagnosis not present

## 2019-05-08 DIAGNOSIS — Z8249 Family history of ischemic heart disease and other diseases of the circulatory system: Secondary | ICD-10-CM | POA: Diagnosis not present

## 2019-05-08 DIAGNOSIS — I495 Sick sinus syndrome: Secondary | ICD-10-CM | POA: Diagnosis not present

## 2019-05-08 DIAGNOSIS — I4891 Unspecified atrial fibrillation: Secondary | ICD-10-CM | POA: Diagnosis not present

## 2019-05-08 DIAGNOSIS — I739 Peripheral vascular disease, unspecified: Secondary | ICD-10-CM | POA: Diagnosis not present

## 2019-05-08 DIAGNOSIS — Z79899 Other long term (current) drug therapy: Secondary | ICD-10-CM | POA: Diagnosis not present

## 2019-05-08 DIAGNOSIS — Z89512 Acquired absence of left leg below knee: Secondary | ICD-10-CM | POA: Diagnosis not present

## 2019-05-08 DIAGNOSIS — Z811 Family history of alcohol abuse and dependence: Secondary | ICD-10-CM | POA: Diagnosis not present

## 2019-05-08 DIAGNOSIS — C7972 Secondary malignant neoplasm of left adrenal gland: Secondary | ICD-10-CM | POA: Insufficient documentation

## 2019-05-08 DIAGNOSIS — C220 Liver cell carcinoma: Secondary | ICD-10-CM | POA: Insufficient documentation

## 2019-05-08 DIAGNOSIS — Z7982 Long term (current) use of aspirin: Secondary | ICD-10-CM | POA: Diagnosis not present

## 2019-05-08 DIAGNOSIS — F17210 Nicotine dependence, cigarettes, uncomplicated: Secondary | ICD-10-CM | POA: Diagnosis not present

## 2019-05-08 DIAGNOSIS — I272 Pulmonary hypertension, unspecified: Secondary | ICD-10-CM | POA: Insufficient documentation

## 2019-05-08 DIAGNOSIS — I4819 Other persistent atrial fibrillation: Secondary | ICD-10-CM | POA: Diagnosis not present

## 2019-05-08 DIAGNOSIS — E279 Disorder of adrenal gland, unspecified: Secondary | ICD-10-CM | POA: Diagnosis present

## 2019-05-08 DIAGNOSIS — K7460 Unspecified cirrhosis of liver: Secondary | ICD-10-CM | POA: Diagnosis not present

## 2019-05-08 HISTORY — DX: Other specified disorders of adrenal gland: E27.8

## 2019-05-08 HISTORY — DX: Sick sinus syndrome: I49.5

## 2019-05-08 HISTORY — DX: Peripheral vascular disease, unspecified: I73.9

## 2019-05-08 HISTORY — DX: Chronic obstructive pulmonary disease, unspecified: J44.9

## 2019-05-08 HISTORY — DX: Atherosclerosis of aorta: I70.0

## 2019-05-08 LAB — CBC
HCT: 48.3 % (ref 39.0–52.0)
Hemoglobin: 16.1 g/dL (ref 13.0–17.0)
MCH: 34.3 pg — ABNORMAL HIGH (ref 26.0–34.0)
MCHC: 33.3 g/dL (ref 30.0–36.0)
MCV: 103 fL — ABNORMAL HIGH (ref 80.0–100.0)
Platelets: 256 10*3/uL (ref 150–400)
RBC: 4.69 MIL/uL (ref 4.22–5.81)
RDW: 13.1 % (ref 11.5–15.5)
WBC: 5.7 10*3/uL (ref 4.0–10.5)
nRBC: 0 % (ref 0.0–0.2)

## 2019-05-08 LAB — COMPREHENSIVE METABOLIC PANEL
ALT: 117 U/L — ABNORMAL HIGH (ref 0–44)
AST: 29 U/L (ref 15–41)
Albumin: 3 g/dL — ABNORMAL LOW (ref 3.5–5.0)
Alkaline Phosphatase: 84 U/L (ref 38–126)
Anion gap: 9 (ref 5–15)
BUN: 12 mg/dL (ref 8–23)
CO2: 25 mmol/L (ref 22–32)
Calcium: 8.9 mg/dL (ref 8.9–10.3)
Chloride: 105 mmol/L (ref 98–111)
Creatinine, Ser: 0.72 mg/dL (ref 0.61–1.24)
GFR calc Af Amer: >60 mL/min (ref >60)
GFR calc non Af Amer: >60 mL/min (ref >60)
Glucose, Bld: 97 mg/dL (ref 70–99)
Potassium: 4.5 mmol/L (ref 3.5–5.1)
Sodium: 139 mmol/L (ref 135–145)
Total Bilirubin: 0.7 mg/dL (ref 0.3–1.2)
Total Protein: 6.8 g/dL (ref 6.5–8.1)

## 2019-05-08 LAB — PROTIME-INR
INR: 1.3 — ABNORMAL HIGH (ref 0.8–1.2)
Prothrombin Time: 16 seconds — ABNORMAL HIGH (ref 11.4–15.2)

## 2019-05-08 LAB — TSH: TSH: 0.873 u[IU]/mL (ref 0.350–4.500)

## 2019-05-08 LAB — T4, FREE: Free T4: 1.03 ng/dL (ref 0.61–1.12)

## 2019-05-08 MED ORDER — METOPROLOL SUCCINATE ER 100 MG PO TB24: 100.0000 mg | ORAL_TABLET | Freq: Every day | ORAL | 6 refills | Status: AC

## 2019-05-08 MED ORDER — MIDAZOLAM HCL 2 MG/2ML IJ SOLN
INTRAMUSCULAR | Status: AC
  Filled 2019-05-08: qty 4

## 2019-05-08 MED ORDER — FENTANYL CITRATE (PF) 100 MCG/2ML IJ SOLN
INTRAMUSCULAR | Status: AC | PRN
  Administered 2019-05-08 (×2): 25 ug via INTRAVENOUS
  Administered 2019-05-08: 50 ug via INTRAVENOUS

## 2019-05-08 MED ORDER — SODIUM CHLORIDE 0.9 % IV SOLN
INTRAVENOUS | Status: DC
  Administered 2019-05-08: 15:00:00 via INTRAVENOUS

## 2019-05-08 MED ORDER — MIDAZOLAM HCL 2 MG/2ML IJ SOLN
INTRAMUSCULAR | Status: AC | PRN
  Administered 2019-05-08 (×2): 0.5 mg via INTRAVENOUS
  Administered 2019-05-08: 1 mg via INTRAVENOUS

## 2019-05-08 MED ORDER — FENTANYL CITRATE (PF) 100 MCG/2ML IJ SOLN
INTRAMUSCULAR | Status: AC
  Filled 2019-05-08: qty 4

## 2019-05-08 NOTE — Discharge Instructions (Addendum)
Needle Biopsy, Care After °These instructions tell you how to care for yourself after your procedure. Your doctor may also give you more specific instructions. Call your doctor if you have any problems or questions. °What can I expect after the procedure? °After the procedure, it is common to have: °· Soreness. °· Bruising. °· Mild pain. °Follow these instructions at home: ° °· Return to your normal activities as told by your doctor. Ask your doctor what activities are safe for you. °· Take over-the-counter and prescription medicines only as told by your doctor. °· Wash your hands with soap and water before you change your bandage (dressing). If you cannot use soap and water, use hand sanitizer. °· Follow instructions from your doctor about: °? How to take care of your puncture site. °? When and how to change your bandage. °? When to remove your bandage. °· Check your puncture site every day for signs of infection. Watch for: °? Redness, swelling, or pain. °? Fluid or blood.  °? Pus or a bad smell. °? Warmth. °· Do not take baths, swim, or use a hot tub until your doctor approves. Ask your doctor if you may take showers. You may only be allowed to take sponge baths. °· Keep all follow-up visits as told by your doctor. This is important. °Contact a doctor if you have: °· A fever. °· Redness, swelling, or pain at the puncture site, and it lasts longer than a few days. °· Fluid, blood, or pus coming from the puncture site. °· Warmth coming from the puncture site. °Get help right away if: °· You have a lot of bleeding from the puncture site. °Summary °· After the procedure, it is common to have soreness, bruising, or mild pain at the puncture site. °· Check your puncture site every day for signs of infection, such as redness, swelling, or pain. °· Get help right away if you have severe bleeding from your puncture site. °This information is not intended to replace advice given to you by your health care provider. Make  sure you discuss any questions you have with your health care provider. °Document Released: 08/02/2008 Document Revised: 09/02/2017 Document Reviewed: 09/02/2017 °Elsevier Patient Education © 2020 Else[employer] Inc[employer] °Moderate Conscious Sedation, Adult, Care After °These instructions provide you with information about caring for yourself after your procedure. Your health care provider may also give you more specific instructions. Your treatment has been planned according to current medical practices, but problems sometimes occur. Call your health care provider if you have any problems or questions after your procedure. °What can I expect after the procedure? °After your procedure, it is common: °· To feel sleepy for several hours. °· To feel clumsy and have poor balance for several hours. °· To have poor judgment for several hours. °· To vomit if you eat too soon. °Follow these instructions at home: °For at least 24 hours after the procedure: ° °· Do not: °? Participate in activities where you could fall or become injured. °? Drive. °? Use heavy machinery. °? Drink alcohol. °? Take sleeping pills or medicines that cause drowsiness. °? Make important decisions or sign legal documents. °? Take care of children on your own. °· Rest. °Eating and drinking °· Follow the diet recommended by your health care provider. °· If you vomit: °? Drink water, juice, or soup when you can drink without vomiting. °? Make sure you have little or no nausea before eating solid foods. °General instructions °· Have a responsible adult stay with   you until you are awake and alert. °· Take over-the-counter and prescription medicines only as told by your health care provider. °· If you smoke, do not smoke without supervision. °· Keep all follow-up visits as told by your health care provider. This is important. °Contact a health care provider if: °· You keep feeling nauseous or you keep vomiting. °· You feel light-headed. °· You develop a rash. °· You  have a fever. °Get help right away if: °· You have trouble breathing. °This information is not intended to replace advice given to you by your health care provider. Make sure you discuss any questions you have with your health care provider. °Document Released: 06/10/2013 Document Revised: 08/02/2017 Document Reviewed: 12/10/2015 °Elsevier Patient Education © 2020 Elsevier Inc. ° °
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
## 2019-05-08 NOTE — Progress Notes (Signed)
F/u appointment scheduled for 9/25 with me (no availability on Dr. Kyla Balzarine care team so will see me in f/u for continuity). Dayna Dunn PA-C

## 2019-05-08 NOTE — Procedures (Signed)
Interventional Radiology Procedure Note  Procedure: CT Guided Biopsy of left adrenal mass  Complications: None  Estimated Blood Loss: < 10 mL  Findings: 18 G core biopsy of left adrenal mass performed under CT guidance.  Three core samples obtained and sent to Pathology.  Markitta Ausburn T. Jahred Tatar, M.D Pager:  [phone]   

## 2019-05-18 ENCOUNTER — Encounter (HOSPITAL_COMMUNITY): Payer: Self-pay | Admitting: Hematology

## 2019-05-18 ENCOUNTER — Other Ambulatory Visit: Payer: Self-pay

## 2019-05-18 ENCOUNTER — Encounter (HOSPITAL_COMMUNITY): Payer: Self-pay | Admitting: *Deleted

## 2019-05-18 ENCOUNTER — Inpatient Hospital Stay (HOSPITAL_COMMUNITY): Payer: Medicare HMO | Attending: Hematology | Admitting: Hematology

## 2019-05-18 VITALS — BP 99/55 | HR 63 | Temp 97.3°F | Resp 18 | Wt 151.0 lb

## 2019-05-18 DIAGNOSIS — R319 Hematuria, unspecified: Secondary | ICD-10-CM | POA: Insufficient documentation

## 2019-05-18 DIAGNOSIS — C220 Liver cell carcinoma: Secondary | ICD-10-CM | POA: Diagnosis present

## 2019-05-18 DIAGNOSIS — Z23 Encounter for immunization: Secondary | ICD-10-CM | POA: Diagnosis not present

## 2019-05-18 DIAGNOSIS — C7972 Secondary malignant neoplasm of left adrenal gland: Secondary | ICD-10-CM | POA: Insufficient documentation

## 2019-05-18 DIAGNOSIS — Z5112 Encounter for antineoplastic immunotherapy: Secondary | ICD-10-CM | POA: Diagnosis present

## 2019-05-18 DIAGNOSIS — Z7189 Other specified counseling: Secondary | ICD-10-CM | POA: Insufficient documentation

## 2019-05-18 DIAGNOSIS — Z79899 Other long term (current) drug therapy: Secondary | ICD-10-CM | POA: Insufficient documentation

## 2019-05-18 DIAGNOSIS — Z0000 Encounter for general adult medical examination without abnormal findings: Secondary | ICD-10-CM

## 2019-05-18 DIAGNOSIS — C7951 Secondary malignant neoplasm of bone: Secondary | ICD-10-CM | POA: Insufficient documentation

## 2019-05-18 MED ORDER — INFLUENZA VAC SPLIT QUAD 0.5 ML IM SUSY
0.5000 mL | PREFILLED_SYRINGE | Freq: Once | INTRAMUSCULAR | Status: AC
  Administered 2019-05-18: 0.5 mL via INTRAMUSCULAR

## 2019-05-18 MED ORDER — INFLUENZA VAC SPLIT QUAD 0.5 ML IM SUSY
PREFILLED_SYRINGE | INTRAMUSCULAR | Status: AC
  Filled 2019-05-18: qty 0.5
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
## 2019-05-18 NOTE — Assessment & Plan Note (Signed)
1.  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 -Previous history of hep C, treated with Harvoni, reportedly cured. - PET scan on 02/20/2019 shows hypermetabolic lesion in the left side of the mandible, SUV of 12.3.  Left scapular lesion and destructive lesion in the right sacrum.  Hypermetabolic left adrenal mass most likely metastatic.  No extra adrenal soft tissue primary noted. -CT-guided biopsy of the sacral mass on 03/25/2019 shows necrotic tissue. - Left adrenal gland biopsy on 05/08/2019 shows metastatic hepatocellular carcinoma. -We had prolonged discussion about the path report and prognosis.  We also talked about management options including active therapy versus best supportive care in the form of hospice. - I have recommended combination of bevacizumab and atezolizumab given every 21 days which has shown improvement in median OS and PFS compared to sorafenib.  Objective response rate was 30%.  We talked about common side effects including hypotension, LFT elevation, proteinuria. -We will likely start therapy after restaging CT CAP done.  We will also check his PT, PTT and AFP levels.  2.  Hematuria: - He has an indwelling Foley catheter from ER visit on 02/05/2019 for urinary retention.  He had the catheter discontinued but ended up on the Foley the very next day. - He is on alfuzosin managed by urology.

## 2019-05-18 NOTE — Progress Notes (Signed)
START OFF PATHWAY REGIMEN - Other   [id number]:Atezolizumab 1,200 mg IV D1 + Bevacizumab 15 mg/kg IV D1 q21 Days:   A cycle is every 21 Days:     Atezolizumab      Bevacizumab-xxxx   **Always confirm dose/schedule in your pharmacy ordering system**  Patient Characteristics: Intent of Therapy: Non-Curative / Palliative Intent, Discussed with Patient 

## 2019-05-18 NOTE — Patient Instructions (Signed)
[place] at [facility] Discharge Instructions  You were seen today by Dr. Delton Coombes. He went over your recent lab results. You have metastatic hepatocellular carcinoma. We can treat, and control your cancer but not cure your cancer. He discussed your plan of care and type of treatment you will receive. He will see you back after your scans for follow up.   Thank you for choosing [place] at [facility] to provide your oncology and hematology care.  To afford each patient quality time with our provider, please arrive at least 15 minutes before your scheduled appointment time.   If you have a lab appointment with the [facility] please come in thru the  Main Entrance and check in at the main information desk  You need to re-schedule your appointment should you arrive 10 or more minutes late.  We strive to give you quality time with our providers, and arriving late affects you and other patients whose appointments are after yours.  Also, if you no show three or more times for appointments you may be dismissed from the clinic at the providers discretion.     Again, thank you for choosing [facility].  Our hope is that these requests will decrease the amount of time that you wait before being seen by our physicians.       _____________________________________________________________  Should you have questions after your visit to [facility], please contact our office at [phone] between the hours of 8:00 a.m. and 4:30 p.m.  Voicemails left after 4:00 p.m. will not be returned until the following business day.  For prescription refill requests, have your pharmacy contact our office and allow 72 hours.    Cancer Center Support Programs:   > Cancer Support Group  2nd Tuesday of the month 1pm-2pm, Journey Room

## 2019-05-18 NOTE — Progress Notes (Signed)
I met with patient today during the visit with Dr. Delton Coombes. I provided my contact information to him again.  I provided written education material on atezolizumab and avastin.  I explained the next steps in his care. He was allowed to ask questions and all were answered to his satisfaction.

## 2019-05-21 NOTE — Patient Instructions (Signed)
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  You will be treated every three weeks with two immunotherapy medications, atezolizumab (Tecentriq) and bevicizumab (Avastin).  The intent of treatment is to control your cancer, keep it from spreading further, and to help alleviate any symptoms you may be having related to this cancer.  You will see the doctor regularly throughout treatment.  We will obtain blood work from you prior to every treatment and monitor your results to make sure it is safe to give your treatment. The doctor monitors your response to treatment by the way you are feeling, your blood work, and by obtaining scans periodically.  There will be wait times while you are here for treatment.  It will take about 30 minutes to 1 hour for your lab work to result.  Then there will be wait times while pharmacy mixes your medications.   Atezolizumab Gildardo Pounds)  About This Drug Huey Bienenstock is used to treat cancer. It is given by the vein (IV).  Possible Side Effects . Nausea . Tiredness and weakness . Decreased appetite (decreased hunger) . Cough . Trouble breathing  Note: Each of the side effects above was reported in 20% or greater of patients treated with atezolizumab. Your side effects may be different if you are taking atezolizumab in combination with another agent. Not all possible side effects are included above.  Warnings and Precautions . This drug works with your immune system and can cause inflammation (swelling) in any of your organs and tissues and can change how they work. This may put you at risk for developing serious medical problems, which can be life-threatening. . Inflammation of the lungs which can be life-threatening. You may have a dry cough or trouble breathing. . Severe changes in your liver function which can cause liver failure and be life-threatening. . Colitis which is  swelling in the colon. The symptoms are diarrhea, stomach cramping, and sometimes blood in the bowel movements. . Changes in your central nervous system can happen. The central nervous system is made up of your brain and spinal cord. You could feel extreme tiredness, agitation, confusion, hallucinations (see or hear things that are not there), trouble understanding or speaking, loss of control of your bowels or bladder, eyesight changes, numbness or lack of strength to your arms, legs, face, or body, and coma. If you start to have any of these symptoms let your doctor know right away. . This drug may affect your hormone glands (thyroid, adrenals, pituitary and pancreas). . Blood sugar levels may change, and you may develop diabetes. If you already have diabetes, changes may need to be made to your diabetes medication. . Severe infections, including viral, bacterial and fungal, which can be life-threatening . While you are getting this drug in your vein (IV), you may have a reaction to the drug. Sometimes you may be given medication to stop or lessen these side effects. Your nurse will check you closely for these signs: fever or shaking chills, flushing, facial swelling, feeling dizzy, headache, trouble breathing, rash, itching, chest tightness, or chest pain. These reactions may happen after your infusion. If this happens, call 911 for emergency care.  Note: Some of the side effects above are very rare. If you have concerns and/or questions, please discuss them with your medical team.  Important Information . This drug may be present in the saliva, tears, sweat, urine, stool, vomit, semen, and vaginal secretions. Talk to your doctor  and/or your nurse about the necessary precautions to take during this time.  Treating Side Effects . Manage tiredness by pacing your activities for the day. . Be sure to include periods of rest between energy-draining activities. . To decrease the risk of  infection, wash your hands regularly. . Avoid close contact with people who have a cold, the flu, or other infections. . Take your temperature as your doctor or nurse tells you, and whenever you feel like you may have a fever. . Drink plenty of fluids (a minimum of eight glasses per day is recommended). . Ask your doctor or nurse about medicine that is available to help stop or lessen loose bowel movements. . To help with nausea, eat small, frequent meals instead of three large meals a day. Choose foods and drinks that are at room temperature. Ask your nurse or doctor about other helpful tips and medicine that is available to help stop or lessen these symptoms. . To help with decreased appetite, eat small, frequent meals. Eat foods high in calories and protein, such as meat, poultry, fish, dry beans, tofu, eggs, nuts, milk, yogurt, cheese, ice cream, pudding, and nutritional supplements. . Consider using sauces and spices to increase taste. Daily exercise, with your doctor's approval, may increase your appetite. . If you have diabetes, keep good control of your blood sugar level. Tell your nurse or your doctor if your glucose levels are higher or lower than normal. . Keeping your pain under control is important to your well-being. Please tell your doctor or nurse if you are experiencing pain. . If you get a rash do not put anything on it unless your doctor or nurse says you may. Keep the area around the rash clean and dry. Ask your doctor for medicine if your rash bothers you. . If you have numbness and tingling in your hands and feet, be careful when cooking, walking, and handling sharp objects and hot liquids. . Infusion reactions may happen after your infusion. If this happens, call 911 for emergency care.  Food and Drug Interactions . There are no known interactions of atezolizumab with food. . This drug may interact with other medicines. Tell your doctor and pharmacist about all  the prescription and over-the-counter medicines and dietary supplements (vitamins, minerals, herbs and others) that you are taking at this time. Also, check with your doctor or pharmacist before starting any new prescription or over-the-counter medicines, or dietary supplements to make sure that there are no interactions.  When to Call the Doctor  Call your doctor or nurse if you have any of these symptoms and/or any new or unusual symptoms:  . Fever of 100.4 F (38 C) or higher . Chills . Tiredness that interferes with your daily activities . Feeling dizzy or lightheaded . Pain in your chest . Dry cough . Coughing up yellow, green, or bloody mucus . Wheezing or trouble breathing . Feeling that your heart is beating in a fast or not normal way (palpitations) . Confusion and/or agitation . Hallucinations . Trouble understanding or speaking . Numbness or lack of strength to your arms, legs, face, or body . Blurred vision or other changes in eyesight . Diarrhea, 4 times in one day or diarrhea with lack of strength or a feeling of being dizzy . Pain in your abdomen that does not go away . Blood in your stool . Nausea that stops you from eating or drinking and/or is not relieved by prescribed medicines . Throwing up more than 3  times a day . Lasting loss of appetite or rapid weight loss of five pounds in a week . Abnormal blood sugar . Unusual thirst, passing urine often, headache, sweating, shakiness, irritability . Pain that does not go away, or is not relieved by prescribed medicines . Numbness, tingling, or pain your hands and feet . Extreme weakness that interferes with normal activities . A new rash or a rash that is not relieved by prescribed medicines . Signs of infusion reaction: fever or shaking chills, flushing, facial swelling, feeling dizzy, headache, trouble breathing, rash, itching, chest tightness, or chest pain. If this happens, call 911 for emergency care. . Signs  of possible liver problems: dark urine, pale bowel movements, bad stomach pain, feeling very tired and weak, unusual itching, or yellowing of the eyes or skin . If you think you may be pregnant  Reproduction Warnings  . Pregnancy warning: This drug can have harmful effects on the unborn baby. Women of childbearing potential should use effective methods of birth control during your cancer treatment and for at least 5 months after treatment. Let your doctor know right away if you think you may be pregnant.  . Breastfeeding warning: It is not known if this drug passes into breast milk. For this reason, Women should not breastfeed during treatment and for at least 5 months after treatment because this drug could enter the breast milk and cause harm to a breastfeeding baby.  . Fertility warning: In women, this drug may affect your ability to have children in the future. Talk with your doctor or nurse if you plan to have children. Ask for information on egg banking.   Bevacizumab (Avastin)  About This Drug Bevacizumab is used to treat cancer. It is given in the vein (IV).  Possible Side Effects . Teary eyes . Runny/stuffy nose . Nosebleed . Changes in the way food and drinks taste . Headache . Back pain . Protein in your urine . Bleeding in your rectum . Dry skin . A red skin rash which can be peeling or scaling . High blood pressure  Note: Each of the side effects above was reported in 10% or greater of patients treated with bevacizumab-xxxx. Not all possible side effects are included above.  Warnings and Precautions  . Perforation or fistula- an abnormal hole in your stomach, intestine, esophagus, or other organ, which can be life-threatening . Slow wound healing, which can be life-threatening . Abnormal bleeding which can be life-threatening - symptoms may be coughing up blood, throwing up blood (may look like coffee grounds), red or black tarry bowel movements, abnormally  heavy menstrual flow, nosebleeds or any other unusual bleeding. . Blood clots and events such as stroke and heart attack. A blood clot in your leg may cause your leg to swell, appear red and warm, and/or cause pain. A blood clot in your lungs may cause trouble breathing, pain when breathing, and/or chest pain. . Severe high blood pressure . Changes in your central nervous system can happen. The central nervous system is made up of your brain and spinal cord. You could feel extreme tiredness, agitation, confusion, hallucinations . This drug may interact with other medicines. Tell your doctor and pharmacist about all the medicines and dietary supplements (vitamins, minerals, herbs and others) that you are taking at this time. Also, check with your doctor or pharmacist before starting any new prescription or over-thecounter medicines, or dietary supplements to make sure that there are no interactions.  When to Call the Doctor  Call your doctor or nurse if you have any of these symptoms and/or any new or unusual symptoms:  . Fever of 100.4 F (38 C) or higher . Chills . Confusion and/or agitation . Hallucinations . Trouble understanding or speaking . Headache that does not go away . Nose bleed that doesn't stop bleeding after 10 -15 minutes . Feeling dizzy or lightheaded . Blurry vision or changes in your eyesight . Difficulty swallowing . Easy bleeding or bruising . Blood in your urine, vomit (bright red or coffee-ground) and/or stools ( bright red, or black/tarry) . Coughing up blood . Wheezing and/or trouble breathing . Chest pain or symptoms of a heart attack. Most heart attacks involve pain in the center of the chest that lasts more than a few minutes. The pain may go away and come back. It can feel like pressure, squeezing, fullness, or pain. Sometimes pain is felt in one or both arms, the back, neck, jaw, or stomach. If any of these symptoms last 2 minutes, call 911. [name] Symptoms  of a stroke such as sudden numbness or weakness of your face, arm, or leg, mostly on one side of your body; sudden confusion, trouble speaking or understanding; sudden trouble seeing in one or both eyes; sudden trouble walking, feeling dizzy, loss of balance or coordination; or sudden, bad headache with no known cause. If you have any of these symptoms for 2 minutes, call 911. . Numbness or lack of strength to your arms, legs, face, or body . Nausea that stops you from eating or drinking and/or relieved by prescribed medicine . Throwing up more than 3 times a day . Pain in your abdomen that does not go away . Foamy or bubbly-looking urine . Signs of infusion reaction: fever or shaking chills, flushing, facial swelling, feeling dizzy, headache, trouble breathing, rash, itching, chest tightness, or chest pain. . Pain that does not go away or is not relieved by prescribed medicine . Your leg or arm is swollen, red, warm and/or painful . Swelling of arms, hands, legs and/or feet . Weight gain of 5 pounds in one week (fluid retention) . If you think you may be pregnant  Reproduction Warnings  . Pregnancy warning: This drug can have harmful effects on the unborn baby. Women of child bearing potential should use effective methods of birth control during your cancer treatment and for 6 months after treatment. In women, changes in your ovaries may happen that may cause menstrual bleeding to become irregular or stop, do not assume you cannot get pregnant. Let your doctor know right away if you think you may be pregnant . Breastfeeding warning: Women should not breastfeed during treatment and for 6 months after treatment because this drug could enter the breast milk and cause harm to a breastfeeding baby. . Fertility warning: In women, this drug may affect your ability to have children in the future. Talk with your doctor or nurse if you plan to have children. Ask for information on egg  banking.   SELF CARE ACTIVITIES WHILE ON IMMUNOTHERAPY:  Hydration Increase your fluid intake 48 hours prior to treatment and drink at least 8 to 12 cups (64 ounces) of water/decaffeinated beverages per day after treatment. You can still have your cup of coffee or soda but these beverages do not count as part of your 8 to 12 cups that you need to drink daily. No alcohol intake.  Medications Continue taking your normal prescription medication as prescribed.  If you start any new herbal  or new supplements please let us know first to make sure it is safe.  Mouth Care Have teeth cleaned professionally before starting treatment. Keep dentures and partial plates clean. Use soft toothbrush and do not use mouthwashes that contain alcohol. Biotene is a good mouthwash that is available at most pharmacies or may be ordered by calling [phone]. Use warm salt water gargles (1 teaspoon salt per 1 quart warm water) before and after meals and at bedtime. Or you may rinse with 2 tablespoons of three-percent hydrogen peroxide mixed in eight ounces of water. If you are still having problems with your mouth or sores in your mouth please call the clinic. If you need dental work, please let the doctor know before you go for your appointment so that we can coordinate the best possible time for you in regards to your chemo regimen. You need to also let your dentist know that you are actively taking chemo. We may need to do labs prior to your dental appointment.  Skin Care Always use sunscreen that has not expired and with SPF (Sun Protection Factor) of 50 or higher. Wear hats to protect your head from the sun. Remember to use sunscreen on your hands, ears, face, & feet.  Use good moisturizing lotions such as udder cream, eucerin, or even Vaseline. Some chemotherapies can cause dry skin, color changes in your skin and nails.    . Avoid long, hot showers or baths. . Use gentle, fragrance-free soaps and laundry  detergent. . Use moisturizers, preferably creams or ointments rather than lotions because the thicker consistency is better at preventing skin dehydration. Apply the cream or ointment within 15 minutes of showering. Reapply moisturizer at night, and moisturize your hands every time after you wash them.   Infection Prevention Please wash your hands for at least 30 seconds using warm soapy water. Handwashing is the #1 way to prevent the spread of germs. Stay away from sick people or people who are getting over a cold. If you develop respiratory systems such as green/yellow mucus production or productive cough or persistent cough let us know and we will see if you need an antibiotic. It is a good idea to keep a pair of gloves on when going into grocery stores/[employer] to decrease your risk of coming into contact with germs on the carts, etc. Carry alcohol hand gel with you at all times and use it frequently if out in public. If your temperature reaches 100.5 or higher please call the clinic and let us know.  If it is after hours or on the weekend please go to the ER if your temperature is over 100.4.  Please have your own personal thermometer at home to use.    Sex and bodily fluids If you are going to have sex, a condom must be used to protect the person that isn't taking immunotherapy. For a few days after treatment, immunotherapy can be excreted through your bodily fluids.  When using the toilet please close the lid and flush the toilet twice.  Do this for a few day after you have had immunotherapy.   Contraception It is not known for sure whether or not immunotherapy drugs can be passed on through semen or secretions from the vagina. Because of this some doctors advise people to use a barrier method if you have sex during treatment. This applies to vaginal, anal or oral sex.  Generally, doctors advise a barrier method only for the time you are actually having the  treatment and for about a week after  your treatment.  Advice like this can be worrying, but this does not mean that you have to avoid being intimate with your partner. You can still have close contact with your partner and continue to enjoy sex.  Animals If you have cats or birds we just ask that you not change the litter or change the cage.  Please have someone else do this for you while you are on immunotherapy.   Food Safety During and After Cancer Treatment Food safety is important for people both during and after cancer treatment. Cancer and cancer treatments, such as chemotherapy, radiation therapy, and stem cell/bone marrow transplantation, often weaken the immune system. This makes it harder for your body to protect itself from foodborne illness, also called food poisoning. Foodborne illness is caused by eating food that contains harmful bacteria, parasites, or viruses.  Foods to avoid Some foods have a higher risk of becoming tainted with bacteria. These include: [name] Unwashed fresh fruit and vegetables, especially leafy vegetables that can hide dirt and other contaminants . Raw sprouts, such as alfalfa sprouts . Raw or undercooked beef, especially ground beef, or other raw or undercooked meat and poultry . Fatty, fried, or spicy foods immediately before or after treatment.  These can sit heavy on your stomach and make you feel nauseous. . Raw or undercooked shellfish, such as oysters. . Sushi and sashimi, which often contain raw fish.  . Unpasteurized beverages, such as unpasteurized fruit juices, raw milk, raw yogurt, or cider . Undercooked eggs, such as soft boiled, over easy, and poached; raw, unpasteurized eggs; or foods made with raw egg, such as homemade raw cookie dough and homemade mayonnaise  Simple steps for food safety  Shop smart. . Do not buy food stored or displayed in an unclean area. . Do not buy bruised or damaged fruits or vegetables. . Do not buy cans that have cracks, dents, or bulges. . Pick up  foods that can spoil at the end of your shopping trip and store them in a cooler on the way home.  Prepare and clean up foods carefully. . Rinse all fresh fruits and vegetables under running water, and dry them with a clean towel or paper towel. . Clean the top of cans before opening them. . After preparing food, wash your hands for 20 seconds with hot water and soap. Pay special attention to areas between fingers and under nails. . Clean your utensils and dishes with hot water and soap. [name] Disinfect your kitchen and cutting boards using 1 teaspoon of liquid, unscented bleach mixed into 1 quart of water.    Dispose of old food. . Eat canned and packaged food before its expiration date (the "use by" or "best before" date). . Consume refrigerated leftovers within 3 to 4 days. After that time, throw out the food. Even if the food does not smell or look spoiled, it still may be unsafe. Some bacteria, such as Listeria, can grow even on foods stored in the refrigerator if they are kept for too long.  Take precautions when eating out. . At restaurants, avoid buffets and salad bars where food sits out for a long time and comes in contact with many people. Food can become contaminated when someone with a virus, often a norovirus, or another "bug" handles it. . Put any leftover food in a "to-go" container yourself, rather than having the server do it. And, refrigerate leftovers as soon as you get home. [name]  Choose restaurants that are clean and that are willing to prepare your food as you order it cooked.     SYMPTOMS TO REPORT AS SOON AS POSSIBLE AFTER TREATMENT:   FEVER GREATER THAN 100.5 F  CHILLS WITH OR WITHOUT FEVER  NAUSEA AND VOMITING THAT IS NOT CONTROLLED WITH YOUR NAUSEA MEDICATION  UNUSUAL SHORTNESS OF BREATH  UNUSUAL BRUISING OR BLEEDING  TENDERNESS IN MOUTH AND THROAT WITH OR WITHOUT PRESENCE OF ULCERS  URINARY PROBLEMS  BOWEL PROBLEMS  UNUSUAL RASH      Wear comfortable  clothing and clothing appropriate for easy access to any Portacath or PICC line. Let us know if there is anything that we can do to make your therapy better!    What to do if you need assistance after hours or on the weekends: CALL [phone].  HOLD on the line, do not hang up.  You will hear multiple messages but at the end you will be connected with a nurse triage line.  They will contact the doctor if necessary.  Most of the time they will be able to assist you.  Do not call the hospital operator.      I have been informed and understand all of the instructions given to me and have received a copy. I have been instructed to call the clinic [phone] or my family physician as soon as possible for continued medical care, if indicated. I do not have any more questions at this time but understand that I may call the [facility] or the Patient Navigator at [phone] during office hours should I have questions or need assistance in obtaining follow-up care.

## 2019-05-25 ENCOUNTER — Other Ambulatory Visit: Payer: Self-pay | Admitting: Gastroenterology

## 2019-05-26 ENCOUNTER — Other Ambulatory Visit (HOSPITAL_COMMUNITY): Payer: Medicare HMO | Admitting: General Practice

## 2019-05-26 ENCOUNTER — Encounter: Payer: Self-pay | Admitting: Physician Assistant

## 2019-05-26 ENCOUNTER — Inpatient Hospital Stay (HOSPITAL_COMMUNITY): Payer: Medicare HMO

## 2019-05-26 ENCOUNTER — Other Ambulatory Visit: Payer: Self-pay

## 2019-05-26 DIAGNOSIS — C220 Liver cell carcinoma: Secondary | ICD-10-CM

## 2019-05-26 MED ORDER — PROCHLORPERAZINE MALEATE 10 MG PO TABS: 10.0000 mg | ORAL_TABLET | Freq: Four times a day (QID) | ORAL | 1 refills | Status: AC | PRN
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
## 2019-05-26 NOTE — Progress Notes (Signed)
Immunotherapy education packet given and discussed with pt in detail.  Discussed diagnosis, staging, tx regimen, and intent of tx.  Reviewed immunotherapy medications and side effects.  Instructed on how to manage side effects at home, and when to call the clinic.  Importance of fever/chills discussed with pt and family. Discussed precautions to implement at home after receiving tx, as well as self care strategies. Phone numbers provided for clinic during regular working hours, also how to reach the clinic after hours and on weekends. Pt provided the opportunity to ask questions - all questions answered to pt's satisfaction.     

## 2019-05-27 ENCOUNTER — Ambulatory Visit: Payer: Medicare HMO | Admitting: Urology

## 2019-05-27 ENCOUNTER — Other Ambulatory Visit: Payer: Self-pay

## 2019-05-27 ENCOUNTER — Ambulatory Visit (HOSPITAL_COMMUNITY)
Admission: RE | Admit: 2019-05-27 | Discharge: 2019-05-27 | Disposition: A | Discharge status: Home / Self Care | Payer: Medicare HMO | Source: Ambulatory Visit | Attending: Hematology | Admitting: Hematology

## 2019-05-27 DIAGNOSIS — C220 Liver cell carcinoma: Secondary | ICD-10-CM | POA: Diagnosis present

## 2019-05-27 MED ORDER — IOHEXOL 300 MG/ML  SOLN
100.0000 mL | Freq: Once | INTRAMUSCULAR | Status: AC | PRN
  Administered 2019-05-27: 100 mL via INTRAVENOUS

## 2019-05-29 ENCOUNTER — Ambulatory Visit (INDEPENDENT_AMBULATORY_CARE_PROVIDER_SITE_OTHER): Payer: Medicare HMO | Admitting: Physician Assistant

## 2019-05-29 ENCOUNTER — Other Ambulatory Visit: Payer: Self-pay

## 2019-05-29 ENCOUNTER — Encounter: Payer: Self-pay | Admitting: Physician Assistant

## 2019-05-29 ENCOUNTER — Telehealth: Payer: Self-pay | Admitting: Cardiology

## 2019-05-29 VITALS — BP 100/58 | HR 98 | Ht 69.0 in | Wt 151.2 lb

## 2019-05-29 DIAGNOSIS — I495 Sick sinus syndrome: Secondary | ICD-10-CM | POA: Diagnosis not present

## 2019-05-29 DIAGNOSIS — I10 Essential (primary) hypertension: Secondary | ICD-10-CM

## 2019-05-29 DIAGNOSIS — Z950 Presence of cardiac pacemaker: Secondary | ICD-10-CM | POA: Diagnosis not present

## 2019-05-29 DIAGNOSIS — I4819 Other persistent atrial fibrillation: Secondary | ICD-10-CM | POA: Diagnosis not present

## 2019-05-29 DIAGNOSIS — I272 Pulmonary hypertension, unspecified: Secondary | ICD-10-CM

## 2019-05-29 MED ORDER — DILTIAZEM HCL ER COATED BEADS 360 MG PO CP24: 360.0000 mg | ORAL_CAPSULE | Freq: Every day | ORAL | 3 refills | Status: DC

## 2019-05-29 NOTE — Telephone Encounter (Signed)
-----   Message from Damian Leavell, RN sent at 05/29/2019  1:34 PM EDT ----- Regarding: RE: PPM F/U? Thank you for catching this. It's a [place] Pt.  I will make sure his nurse in [place] is aware and will alert device clinic also. Sonia Baller ----- Message ----- From: Jeanann Lewandowsky, RMA Sent: 05/29/2019   1:27 PM EDT To: Charlie Pitter, PA-C, Damian Leavell, RN Subject: PPM F/U?                                       Hey ma'am! Can you check out something for Dayna?This pt she saw today and he saw Lovena Le in 02/2019 with f/u in January 2021, however, normaly you guys have ppm's downloaded every 3 months and his has nothing?  Is he set up on automatic downloads or is he supposed to send them in, please help.

## 2019-05-29 NOTE — Patient Instructions (Addendum)
Medication Instructions:  Your physician recommends that you continue on your current medications as directed. Please refer to the Current Medication list given to you today.  If you need a refill on your cardiac medications before your next appointment, please call your pharmacy.   Lab work: None ordered If you have labs (blood work) drawn today and your tests are completely normal, you will receive your results only by: [name] MyChart Message (if you have MyChart) OR . A paper copy in the mail If you have any lab test that is abnormal or we need to change your treatment, we will call you to review the results.  Testing/Procedures: None ordered  Follow-Up: At [facility], you and your health needs are our priority.  As part of our continuing mission to provide you with exceptional heart care, we have created designated Provider Care Teams.  These Care Teams include your primary Cardiologist (physician) and Advanced Practice Providers (APPs -  Physician Assistants and Nurse Practitioners) who all work together to provide you with the care you need, when you need it. You will need a follow up appointment in 3 months.  Please call our office 2 months in advance to schedule this appointment.  You may see Jenkins Rouge, MD or one of the following Advanced Practice Providers on your designated Care Team:   Truitt Merle, NP Cecilie Kicks, NP . Kathyrn Drown, NP  Any Other Special Instructions Will Be Listed Below (If Applicable). Talk to your Oncologist about restarting Xarelto

## 2019-05-29 NOTE — Telephone Encounter (Signed)
Spoke w/ pt and he informed me that he never took the monitor out of the box. He is going to hook the monitor up over the weekend and I will call him on Monday 06/01/2019 to help him send a manual transmission w/ his home monitor. Pt agreed to this plan.

## 2019-06-01 ENCOUNTER — Inpatient Hospital Stay (HOSPITAL_COMMUNITY): Payer: Medicare HMO

## 2019-06-01 ENCOUNTER — Inpatient Hospital Stay (HOSPITAL_BASED_OUTPATIENT_CLINIC_OR_DEPARTMENT_OTHER): Payer: Medicare HMO | Admitting: Hematology

## 2019-06-01 ENCOUNTER — Encounter (HOSPITAL_COMMUNITY): Payer: Self-pay | Admitting: Hematology

## 2019-06-01 ENCOUNTER — Other Ambulatory Visit: Payer: Self-pay

## 2019-06-01 VITALS — BP 109/81 | HR 130 | Temp 97.7°F | Resp 20 | Wt 151.7 lb

## 2019-06-01 VITALS — BP 92/68 | HR 71 | Temp 96.7°F | Resp 18

## 2019-06-01 DIAGNOSIS — C220 Liver cell carcinoma: Secondary | ICD-10-CM

## 2019-06-01 DIAGNOSIS — I480 Paroxysmal atrial fibrillation: Secondary | ICD-10-CM

## 2019-06-01 DIAGNOSIS — Z5112 Encounter for antineoplastic immunotherapy: Secondary | ICD-10-CM | POA: Diagnosis not present

## 2019-06-01 LAB — CBC WITH DIFFERENTIAL/PLATELET
Abs Immature Granulocytes: 0.02 10*3/uL (ref 0.00–0.07)
Basophils Absolute: 0.1 10*3/uL (ref 0.0–0.1)
Basophils Relative: 1 %
Eosinophils Absolute: 0.2 10*3/uL (ref 0.0–0.5)
Eosinophils Relative: 3 %
HCT: 50.6 % (ref 39.0–52.0)
Hemoglobin: 16.1 g/dL (ref 13.0–17.0)
Immature Granulocytes: 0 %
Lymphocytes Relative: 27 %
Lymphs Abs: 1.9 10*3/uL (ref 0.7–4.0)
MCH: 33.6 pg (ref 26.0–34.0)
MCHC: 31.8 g/dL (ref 30.0–36.0)
MCV: 105.6 fL — ABNORMAL HIGH (ref 80.0–100.0)
Monocytes Absolute: 0.8 10*3/uL (ref 0.1–1.0)
Monocytes Relative: 11 %
Neutro Abs: 3.9 10*3/uL (ref 1.7–7.7)
Neutrophils Relative %: 58 %
Platelets: 240 10*3/uL (ref 150–400)
RBC: 4.79 MIL/uL (ref 4.22–5.81)
RDW: 13.3 % (ref 11.5–15.5)
WBC: 6.8 10*3/uL (ref 4.0–10.5)
nRBC: 0 % (ref 0.0–0.2)

## 2019-06-01 LAB — URINALYSIS, DIPSTICK ONLY
Bilirubin Urine: NEGATIVE
Glucose, UA: NEGATIVE mg/dL
Ketones, ur: NEGATIVE mg/dL
Nitrite: POSITIVE — AB
Protein, ur: 100 mg/dL — AB
Specific Gravity, Urine: 1.016 (ref 1.005–1.030)
pH: 5 (ref 5.0–8.0)

## 2019-06-01 LAB — COMPREHENSIVE METABOLIC PANEL
ALT: 139 U/L — ABNORMAL HIGH (ref 0–44)
AST: 33 U/L (ref 15–41)
Albumin: 3.5 g/dL (ref 3.5–5.0)
Alkaline Phosphatase: 125 U/L (ref 38–126)
Anion gap: 11 (ref 5–15)
BUN: 13 mg/dL (ref 8–23)
CO2: 27 mmol/L (ref 22–32)
Calcium: 9.2 mg/dL (ref 8.9–10.3)
Chloride: 97 mmol/L — ABNORMAL LOW (ref 98–111)
Creatinine, Ser: 0.77 mg/dL (ref 0.61–1.24)
GFR calc Af Amer: >60 mL/min (ref >60)
GFR calc non Af Amer: >60 mL/min (ref >60)
Glucose, Bld: 101 mg/dL — ABNORMAL HIGH (ref 70–99)
Potassium: 4.6 mmol/L (ref 3.5–5.1)
Sodium: 135 mmol/L (ref 135–145)
Total Bilirubin: 0.4 mg/dL (ref 0.3–1.2)
Total Protein: 8.1 g/dL (ref 6.5–8.1)

## 2019-06-01 LAB — APTT: aPTT: 31 seconds (ref 24–36)

## 2019-06-01 LAB — TSH: TSH: 5.002 u[IU]/mL — ABNORMAL HIGH (ref 0.350–4.500)

## 2019-06-01 LAB — PROTIME-INR
INR: 1.2 (ref 0.8–1.2)
Prothrombin Time: 15.2 seconds (ref 11.4–15.2)

## 2019-06-01 MED ORDER — DIPHENHYDRAMINE HCL 25 MG PO CAPS
ORAL_CAPSULE | ORAL | Status: AC
  Filled 2019-06-01: qty 1

## 2019-06-01 MED ORDER — SODIUM CHLORIDE 0.9 % IV SOLN
Freq: Once | INTRAVENOUS | Status: AC
  Administered 2019-06-01: 12:00:00 via INTRAVENOUS

## 2019-06-01 MED ORDER — SODIUM CHLORIDE 0.9 % IV SOLN
15.0000 mg/kg | Freq: Once | INTRAVENOUS | Status: AC
  Administered 2019-06-01: 1000 mg via INTRAVENOUS
  Filled 2019-06-01: qty 32

## 2019-06-01 MED ORDER — ACETAMINOPHEN 325 MG PO TABS
650.0000 mg | ORAL_TABLET | Freq: Once | ORAL | Status: AC
  Administered 2019-06-01: 650 mg via ORAL

## 2019-06-01 MED ORDER — CIPROFLOXACIN HCL 500 MG PO TABS: 500.0000 mg | ORAL_TABLET | Freq: Two times a day (BID) | ORAL | 0 refills | Status: DC

## 2019-06-01 MED ORDER — ACETAMINOPHEN 325 MG PO TABS
ORAL_TABLET | ORAL | Status: AC
  Filled 2019-06-01: qty 2

## 2019-06-01 MED ORDER — DIPHENHYDRAMINE HCL 25 MG PO CAPS
25.0000 mg | ORAL_CAPSULE | Freq: Once | ORAL | Status: AC
  Administered 2019-06-01: 25 mg via ORAL

## 2019-06-01 MED ORDER — SODIUM CHLORIDE 0.9 % IV SOLN
1200.0000 mg | Freq: Once | INTRAVENOUS | Status: AC
  Administered 2019-06-01: 12:00:00 1200 mg via INTRAVENOUS
  Filled 2019-06-01: qty 20
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
## 2019-06-01 NOTE — Assessment & Plan Note (Addendum)
1.  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 -Previous history of hep C, treated with Harvoni, reportedly cured. - PET scan on 02/20/2019 shows hypermetabolic lesion in the left side of the mandible, SUV of 12.3.  Left scapular lesion and destructive lesion in the right sacrum.  Hypermetabolic left adrenal mass most likely metastatic.  No extra adrenal soft tissue primary noted. -CT-guided biopsy of the sacral mass on 03/25/2019 shows necrotic tissue. - Left adrenal gland biopsy on 05/08/2019 shows metastatic hepatocellular carcinoma. -CT CAP on 05/27/2019 shows interval progression of the left adrenal mass now measuring 8 cm compared to 5.2 cm on previous scan.  Tumor extends into the left adrenal vein tracking into the confluence with the left renal vein.  Similar appearance of sacral lesion.  Stable right hilar lymphadenopathy. - I have recommended combination of bevacizumab and atezolizumab given every 21 days which has shown improvement in median overall survival and PFS compared to sorafenib.  Objective response rate is 30%.  We talked about the common side effects including hypertension, LFT elevation, proteinuria. - His PT, PTT are normal.  AFP levels are pending.  He will proceed with cycle 1 today. -He will be seen back in 1 week for follow-up.  We plan to repeat scans after 3 cycles.  2.  Hematuria: - He has an indwelling Foley catheter from ER visit on 02/05/2019 for urinary retention.  He had the catheter discontinued but ended up on the Foley the very next day. - He is on alfuzosin managed by urology. -UA showed possible infection.  He will be given Cipro for 5 days. - He was told to start back on Xarelto for A. fib.  If there is profuse hematuria, he will stop it.

## 2019-06-01 NOTE — Progress Notes (Signed)
Labs reviewed with MD today. Proceed with day one today.   Treatment given per orders. Patient tolerated it well without problems. Vitals stable and discharged home from clinic ambulatory. Follow up as scheduled.

## 2019-06-01 NOTE — Progress Notes (Signed)
06/01/19  Ok to proceed with HR 130 per Dr Delton Coombes.  T.O. Dr Beckey Downing LPN/Daisey Caloca Ronnald Ramp, PharmD

## 2019-06-01 NOTE — Patient Instructions (Addendum)
[facility] Discharge Instructions  You were seen today by Dr. Katragadda. He went over your recent lab results. He will see you back in 1 week for labs and follow up.   Thank you for choosing [facility] at [facility] to provide your oncology and hematology care.  To afford each patient quality time with our provider, please arrive at least 15 minutes before your scheduled appointment time.   If you have a lab appointment with the Cancer Center please come in thru the  Main Entrance and check in at the main information desk  You need to re-schedule your appointment should you arrive 10 or more minutes late.  We strive to give you quality time with our providers, and arriving late affects you and other patients whose appointments are after yours.  Also, if you no show three or more times for appointments you may be dismissed from the clinic at the providers discretion.     Again, thank you for choosing [facility].  Our hope is that these requests will decrease the amount of time that you wait before being seen by our physicians.       _____________________________________________________________  Should you have questions after your visit to [facility], please contact our office at [phone] between the hours of 8:00 a.m. and 4:30 p.m.  Voicemails left after 4:00 p.m. will not be returned until the following business day.  For prescription refill requests, have your pharmacy contact our office and allow 72 hours.    Cancer Center Support Programs:   > Cancer Support Group  2nd Tuesday of the month 1pm-2pm, Journey Room    

## 2019-06-01 NOTE — Patient Instructions (Signed)
[facility] Discharge Instructions for Patients Receiving Chemotherapy  Today you received the following chemotherapy agents   To help prevent nausea and vomiting after your treatment, we encourage you to take your nausea medication   If you develop nausea and vomiting that is not controlled by your nausea medication, call the clinic.   BELOW ARE SYMPTOMS THAT SHOULD BE REPORTED IMMEDIATELY:  *FEVER GREATER THAN 100.5 F  *CHILLS WITH OR WITHOUT FEVER  NAUSEA AND VOMITING THAT IS NOT CONTROLLED WITH YOUR NAUSEA MEDICATION  *UNUSUAL SHORTNESS OF BREATH  *UNUSUAL BRUISING OR BLEEDING  TENDERNESS IN MOUTH AND THROAT WITH OR WITHOUT PRESENCE OF ULCERS  *URINARY PROBLEMS  *BOWEL PROBLEMS  UNUSUAL RASH Items with * indicate a potential emergency and should be followed up as soon as possible.  Feel free to call the clinic should you have any questions or concerns. The clinic phone number is [phone].  Please show the CHEMO ALERT CARD at check-in to the Emergency Department and triage nurse.   

## 2019-06-01 NOTE — Telephone Encounter (Signed)
LMOVM for pt to return call. Monitor is still not connected according to [employer].

## 2019-06-02 ENCOUNTER — Encounter (HOSPITAL_COMMUNITY): Payer: Self-pay | Admitting: General Practice

## 2019-06-02 LAB — AFP TUMOR MARKER: AFP, Serum, Tumor Marker: 8.1 ng/mL (ref 0.0–8.3)

## 2019-06-02 LAB — T4: T4, Total: 6.6 ug/dL (ref 4.5–12.0)

## 2019-06-02 NOTE — Telephone Encounter (Signed)
Chemotherapy follow up call post 24 hours.  No answer.  Message left with call back number.

## 2019-06-02 NOTE — Progress Notes (Signed)
[facility] CSW Progress Notes  Call to patient to complete social work assessment.  No answer, no VM, unable to leave message or contact patient.  Edwyna Shell, LCSW Clinical Social Worker Phone:  [phone] Cell:  [phone]

## 2019-06-09 ENCOUNTER — Ambulatory Visit (HOSPITAL_COMMUNITY): Payer: Medicare HMO | Admitting: Hematology

## 2019-06-09 ENCOUNTER — Other Ambulatory Visit (HOSPITAL_COMMUNITY): Payer: Medicare HMO

## 2019-06-09 ENCOUNTER — Other Ambulatory Visit: Payer: Self-pay

## 2019-06-09 ENCOUNTER — Inpatient Hospital Stay (HOSPITAL_COMMUNITY): Payer: Medicare HMO | Attending: Hematology

## 2019-06-09 ENCOUNTER — Inpatient Hospital Stay (HOSPITAL_BASED_OUTPATIENT_CLINIC_OR_DEPARTMENT_OTHER): Payer: Medicare HMO | Admitting: Nurse Practitioner

## 2019-06-09 DIAGNOSIS — R319 Hematuria, unspecified: Secondary | ICD-10-CM | POA: Insufficient documentation

## 2019-06-09 DIAGNOSIS — Z5112 Encounter for antineoplastic immunotherapy: Secondary | ICD-10-CM | POA: Diagnosis present

## 2019-06-09 DIAGNOSIS — C220 Liver cell carcinoma: Secondary | ICD-10-CM | POA: Diagnosis not present

## 2019-06-09 DIAGNOSIS — I4891 Unspecified atrial fibrillation: Secondary | ICD-10-CM | POA: Insufficient documentation

## 2019-06-09 DIAGNOSIS — Z7901 Long term (current) use of anticoagulants: Secondary | ICD-10-CM | POA: Diagnosis not present

## 2019-06-09 LAB — COMPREHENSIVE METABOLIC PANEL
ALT: 238 U/L — ABNORMAL HIGH (ref 0–44)
AST: 48 U/L — ABNORMAL HIGH (ref 15–41)
Albumin: 3.9 g/dL (ref 3.5–5.0)
Alkaline Phosphatase: 130 U/L — ABNORMAL HIGH (ref 38–126)
Anion gap: 13 (ref 5–15)
BUN: 13 mg/dL (ref 8–23)
CO2: 26 mmol/L (ref 22–32)
Calcium: 9.5 mg/dL (ref 8.9–10.3)
Chloride: 96 mmol/L — ABNORMAL LOW (ref 98–111)
Creatinine, Ser: 0.88 mg/dL (ref 0.61–1.24)
GFR calc Af Amer: >60 mL/min (ref >60)
GFR calc non Af Amer: >60 mL/min (ref >60)
Glucose, Bld: 104 mg/dL — ABNORMAL HIGH (ref 70–99)
Potassium: 4.7 mmol/L (ref 3.5–5.1)
Sodium: 135 mmol/L (ref 135–145)
Total Bilirubin: 0.9 mg/dL (ref 0.3–1.2)
Total Protein: 8.8 g/dL — ABNORMAL HIGH (ref 6.5–8.1)

## 2019-06-09 LAB — CBC WITH DIFFERENTIAL/PLATELET
Abs Immature Granulocytes: 0.02 10*3/uL (ref 0.00–0.07)
Basophils Absolute: 0.1 10*3/uL (ref 0.0–0.1)
Basophils Relative: 1 %
Eosinophils Absolute: 0.2 10*3/uL (ref 0.0–0.5)
Eosinophils Relative: 2 %
HCT: 55.2 % — ABNORMAL HIGH (ref 39.0–52.0)
Hemoglobin: 18.1 g/dL — ABNORMAL HIGH (ref 13.0–17.0)
Immature Granulocytes: 0 %
Lymphocytes Relative: 19 %
Lymphs Abs: 1.6 10*3/uL (ref 0.7–4.0)
MCH: 33.9 pg (ref 26.0–34.0)
MCHC: 32.8 g/dL (ref 30.0–36.0)
MCV: 103.4 fL — ABNORMAL HIGH (ref 80.0–100.0)
Monocytes Absolute: 0.9 10*3/uL (ref 0.1–1.0)
Monocytes Relative: 10 %
Neutro Abs: 5.8 10*3/uL (ref 1.7–7.7)
Neutrophils Relative %: 68 %
Platelets: 236 10*3/uL (ref 150–400)
RBC: 5.34 MIL/uL (ref 4.22–5.81)
RDW: 13.2 % (ref 11.5–15.5)
WBC: 8.5 10*3/uL (ref 4.0–10.5)
nRBC: 0 % (ref 0.0–0.2)

## 2019-06-09 LAB — MAGNESIUM: Magnesium: 2 mg/dL (ref 1.7–2.4)

## 2019-06-09 NOTE — Assessment & Plan Note (Addendum)
1.  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 -Previous history of hep C, treated with Harvoni, reportedly cured. -PET scan on 02/20/2019 shows hypermetabolic lesion of the left side of the mandible, SUV of 12.3.  Left scapular lesion and destructive lesion in the right sacrum.  Hypermetabolic left adrenal mass most likely metastatic.  No extra adrenal soft tissue primary noted. -CT-guided biopsy of the sacral mass on 03/25/2019 shows necrotic tissue. -Left adrenal gland biopsy on 05/08/2019 shows metastatic hepatocellular carcinoma. -CT CAP on 05/27/2019 shows interval progression of the left adrenal mass now measuring 8 cm compared to the 5.2 cm on previous scan.  Tumor extends into the left adrenal vein tracking into the confluence with the left renal vein.  Similar appearance of the sacral lesion.  Stable right hilar lymphadenopathy. - She will start bevacizumab and atezolizumab given every 21 days which has shown improvement in median overall survival and PF S compared to sorafenib.  Objective response rate is 30%.  Patient and Dr. Delton Coombes discussed common side effects including hypertension, LFT elevation, and proteinuria. - His PT, PTT are normal, AFP levels are 8.1. - He completed cycle 1 on 06/01/2019. -Today he denies any nausea vomiting diarrhea.  -He will repeat scans after 3 cycles. - We will see him back on 06/22/2019 for cycle 2.  2.  Hematuria: - He has an indwelling Foley catheter from ER visit on 02/05/2020 for urinary retention.  He had the catheter discontinued but ended up with a Foley for the very next day. -He is on alfuzosin managed by urology. - His last UA showed a possible infection which she was given Cipro for 5 days. -He started back on his Xarelto for A. fib.  If there is profuse hematuria he will stop it.
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
## 2019-06-18 ENCOUNTER — Encounter (HOSPITAL_COMMUNITY): Payer: Self-pay | Admitting: Hematology

## 2019-06-18 ENCOUNTER — Inpatient Hospital Stay (HOSPITAL_BASED_OUTPATIENT_CLINIC_OR_DEPARTMENT_OTHER): Payer: Medicare HMO | Admitting: Hematology

## 2019-06-18 ENCOUNTER — Inpatient Hospital Stay (HOSPITAL_COMMUNITY): Payer: Medicare HMO

## 2019-06-18 ENCOUNTER — Other Ambulatory Visit: Payer: Self-pay

## 2019-06-18 VITALS — BP 98/71 | HR 83 | Temp 97.3°F | Resp 22 | Wt 142.7 lb

## 2019-06-18 DIAGNOSIS — C220 Liver cell carcinoma: Secondary | ICD-10-CM | POA: Diagnosis not present

## 2019-06-18 DIAGNOSIS — Z5112 Encounter for antineoplastic immunotherapy: Secondary | ICD-10-CM | POA: Diagnosis not present

## 2019-06-18 LAB — CBC WITH DIFFERENTIAL/PLATELET
Abs Immature Granulocytes: 0.03 10*3/uL (ref 0.00–0.07)
Basophils Absolute: 0.1 10*3/uL (ref 0.0–0.1)
Basophils Relative: 1 %
Eosinophils Absolute: 0.1 10*3/uL (ref 0.0–0.5)
Eosinophils Relative: 1 %
HCT: 52 % (ref 39.0–52.0)
Hemoglobin: 16.9 g/dL (ref 13.0–17.0)
Immature Granulocytes: 0 %
Lymphocytes Relative: 23 %
Lymphs Abs: 2.1 10*3/uL (ref 0.7–4.0)
MCH: 33.6 pg (ref 26.0–34.0)
MCHC: 32.5 g/dL (ref 30.0–36.0)
MCV: 103.4 fL — ABNORMAL HIGH (ref 80.0–100.0)
Monocytes Absolute: 0.9 10*3/uL (ref 0.1–1.0)
Monocytes Relative: 10 %
Neutro Abs: 6 10*3/uL (ref 1.7–7.7)
Neutrophils Relative %: 65 %
Platelets: 281 10*3/uL (ref 150–400)
RBC: 5.03 MIL/uL (ref 4.22–5.81)
RDW: 13.4 % (ref 11.5–15.5)
WBC: 9.1 10*3/uL (ref 4.0–10.5)
nRBC: 0 % (ref 0.0–0.2)

## 2019-06-18 LAB — COMPREHENSIVE METABOLIC PANEL
ALT: 247 U/L — ABNORMAL HIGH (ref 0–44)
AST: 52 U/L — ABNORMAL HIGH (ref 15–41)
Albumin: 3.4 g/dL — ABNORMAL LOW (ref 3.5–5.0)
Alkaline Phosphatase: 126 U/L (ref 38–126)
Anion gap: 11 (ref 5–15)
BUN: 17 mg/dL (ref 8–23)
CO2: 23 mmol/L (ref 22–32)
Calcium: 9.1 mg/dL (ref 8.9–10.3)
Chloride: 99 mmol/L (ref 98–111)
Creatinine, Ser: 1 mg/dL (ref 0.61–1.24)
GFR calc Af Amer: >60 mL/min (ref >60)
GFR calc non Af Amer: >60 mL/min (ref >60)
Glucose, Bld: 165 mg/dL — ABNORMAL HIGH (ref 70–99)
Potassium: 3.8 mmol/L (ref 3.5–5.1)
Sodium: 133 mmol/L — ABNORMAL LOW (ref 135–145)
Total Bilirubin: 0.7 mg/dL (ref 0.3–1.2)
Total Protein: 8 g/dL (ref 6.5–8.1)

## 2019-06-18 LAB — LACTATE DEHYDROGENASE: LDH: 164 U/L (ref 98–192)

## 2019-06-18 LAB — MAGNESIUM: Magnesium: 2 mg/dL (ref 1.7–2.4)

## 2019-06-18 NOTE — Patient Instructions (Signed)
[place] at [facility] Discharge Instructions  You were seen today by Dr. Delton Coombes. He went over your recent lab results. He will see you back in 3 weeks from Monday for labs, treatment and follow up.   Thank you for choosing [place] at [facility] to provide your oncology and hematology care.  To afford each patient quality time with our provider, please arrive at least 15 minutes before your scheduled appointment time.   If you have a lab appointment with the [facility] please come in thru the  Main Entrance and check in at the main information desk  You need to re-schedule your appointment should you arrive 10 or more minutes late.  We strive to give you quality time with our providers, and arriving late affects you and other patients whose appointments are after yours.  Also, if you no show three or more times for appointments you may be dismissed from the clinic at the providers discretion.     Again, thank you for choosing [facility].  Our hope is that these requests will decrease the amount of time that you wait before being seen by our physicians.       _____________________________________________________________  Should you have questions after your visit to [facility], please contact our office at [phone] between the hours of 8:00 a.m. and 4:30 p.m.  Voicemails left after 4:00 p.m. will not be returned until the following business day.  For prescription refill requests, have your pharmacy contact our office and allow 72 hours.    Cancer Center Support Programs:   > Cancer Support Group  2nd Tuesday of the month 1pm-2pm, Journey Room

## 2019-06-18 NOTE — Assessment & Plan Note (Signed)
1.  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 -Previous history of hep C, treated with Harvoni, reportedly cured. -Left adrenal biopsy on 05/08/2019 shows metastatic hepatocellular carcinoma. -CT CAP on 05/27/2019 shows interval progression of the left adrenal mass measuring 8 cm compared to 5.2 cm on the previous scan.  Tumor extends into the left adrenal vein tracking into the confluence with the left renal vein.  Similar appearance of sacral lesion.  Stable right hilar lymphadenopathy. -Cycle 1 of bevacizumab and atezolizumab on 06/01/2019. -He lost about 4 pounds in the last 2 and1/2 weeks.  He is drinking 2 cans of Ensure per day.  I have told him to increase to 3 cans/day. -I have reviewed his blood work which is grossly within normal limits.  He will proceed with cycle 2 on 06/22/2019. -I will reevaluate him in 3 weeks.  I plan to repeat scans after 3 cycles.  2.  Hematuria: -He has an indwelling Foley catheter from ER visit on 02/05/2019 for urinary retention.  He had the catheter discontinued but ended up with Foley  the very next day. -He is on alfuzosin managed by urology.  3.  A. fib: -He is on Xarelto.  If he develops profuse hematuria, he will stop it.
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
## 2019-06-19 ENCOUNTER — Other Ambulatory Visit: Payer: Self-pay

## 2019-06-19 MED ORDER — PEGFILGRASTIM-JMDB 6 MG/0.6ML ~~LOC~~ SOSY
PREFILLED_SYRINGE | SUBCUTANEOUS | Status: AC
  Filled 2019-06-19: qty 0.6

## 2019-06-22 ENCOUNTER — Encounter: Payer: Medicare HMO | Admitting: *Deleted

## 2019-06-22 ENCOUNTER — Other Ambulatory Visit (HOSPITAL_COMMUNITY): Payer: Self-pay | Admitting: Hematology

## 2019-06-22 ENCOUNTER — Other Ambulatory Visit: Payer: Self-pay

## 2019-06-22 ENCOUNTER — Inpatient Hospital Stay (HOSPITAL_COMMUNITY): Payer: Medicare HMO

## 2019-06-22 VITALS — BP 122/70 | HR 64 | Temp 97.6°F | Resp 18 | Wt 144.0 lb

## 2019-06-22 DIAGNOSIS — Z5112 Encounter for antineoplastic immunotherapy: Secondary | ICD-10-CM | POA: Diagnosis not present

## 2019-06-22 DIAGNOSIS — C220 Liver cell carcinoma: Secondary | ICD-10-CM

## 2019-06-22 LAB — URINALYSIS, COMPLETE (UACMP) WITH MICROSCOPIC
Bilirubin Urine: NEGATIVE
Glucose, UA: NEGATIVE mg/dL
Ketones, ur: NEGATIVE mg/dL
Nitrite: POSITIVE — AB
Protein, ur: 100 mg/dL — AB
Specific Gravity, Urine: 1.019 (ref 1.005–1.030)
WBC, UA: >50 WBC/hpf — ABNORMAL HIGH (ref 0–5)
pH: 5 (ref 5.0–8.0)

## 2019-06-22 MED ORDER — SODIUM CHLORIDE 0.9 % IV SOLN
15.0000 mg/kg | Freq: Once | INTRAVENOUS | Status: AC
  Administered 2019-06-22: 1000 mg via INTRAVENOUS
  Filled 2019-06-22: qty 32

## 2019-06-22 MED ORDER — SODIUM CHLORIDE 0.9 % IV SOLN
1200.0000 mg | Freq: Once | INTRAVENOUS | Status: AC
  Administered 2019-06-22: 1200 mg via INTRAVENOUS
  Filled 2019-06-22: qty 20

## 2019-06-22 MED ORDER — SODIUM CHLORIDE 0.9 % IV SOLN
Freq: Once | INTRAVENOUS | Status: AC
  Administered 2019-06-22: 11:00:00 via INTRAVENOUS

## 2019-06-22 MED ORDER — ACETAMINOPHEN 325 MG PO TABS
650.0000 mg | ORAL_TABLET | Freq: Once | ORAL | Status: AC
  Administered 2019-06-22: 650 mg via ORAL
  Filled 2019-06-22: qty 2

## 2019-06-22 MED ORDER — DIPHENHYDRAMINE HCL 25 MG PO CAPS
25.0000 mg | ORAL_CAPSULE | Freq: Once | ORAL | Status: AC
  Administered 2019-06-22: 12:00:00 25 mg via ORAL
  Filled 2019-06-22: qty 1

## 2019-06-22 MED ORDER — SULFAMETHOXAZOLE-TRIMETHOPRIM 800-160 MG PO TABS: 1.0000 | ORAL_TABLET | Freq: Two times a day (BID) | ORAL | 0 refills | Status: DC

## 2019-06-22 NOTE — Patient Instructions (Signed)
[facility] Discharge Instructions for Patients Receiving Chemotherapy   Beginning January 23rd 2017 lab work for the [facility] will be done in the  Main lab at [facility] on 1st floor. If you have a lab appointment with the [facility] please come in thru the  Main Entrance and check in at the main information desk   Today you received the following chemotherapy agents Tecentriq and Avastin  To help prevent nausea and vomiting after your treatment, we encourage you to take your nausea medication  If you develop nausea and vomiting, or diarrhea that is not controlled by your medication, call the clinic.  The clinic phone number is [phone]. Office hours are Monday-Friday 8:30am-5:00pm.  BELOW ARE SYMPTOMS THAT SHOULD BE REPORTED IMMEDIATELY:  *FEVER GREATER THAN 101.0 F  *CHILLS WITH OR WITHOUT FEVER  NAUSEA AND VOMITING THAT IS NOT CONTROLLED WITH YOUR NAUSEA MEDICATION  *UNUSUAL SHORTNESS OF BREATH  *UNUSUAL BRUISING OR BLEEDING  TENDERNESS IN MOUTH AND THROAT WITH OR WITHOUT PRESENCE OF ULCERS  *URINARY PROBLEMS  *BOWEL PROBLEMS  UNUSUAL RASH Items with * indicate a potential emergency and should be followed up as soon as possible. If you have an emergency after office hours please contact your primary care physician or go to the nearest emergency department.  Please call the clinic during office hours if you have any questions or concerns.   You may also contact the Patient Navigator at [phone] should you have any questions or need assistance in obtaining follow up care.      Resources For Cancer Patients and their Caregivers ? American Cancer Society: Can assist with transportation, wigs, general needs, runs Look Good Feel Better.        ([phone] ? Cancer Care: Provides financial assistance, online support groups, medication/co-pay assistance.  1-800-813-HOPE (224)232-0288) ? [place] Assists  [place] cancer patients and their families through emotional , educational and financial support.  734-395-3894 ? [place] DSS Where to apply for food stamps, Medicaid and utility assistance. 484-232-1946 ? [facility]: Transportation to medical appointments. (515)166-1188 ? Social Security Administration: May apply for disability if have a Stage IV cancer. ([phone] 019-8092 8676311819 ? [employer], Disability and Transit Services: Assists with nutrition, care and transit needs. 508-746-1939

## 2019-06-22 NOTE — Progress Notes (Signed)
Alejandro Rice presents today for treatment. Pt states to me that since Saturday, he has been having burning when he urinates through his foley as well as bloody urine. Upon assessment of his urine bag, it is dark colored and cloudy. Pt states he has not been taking his Xarelto since this began last month. He had completed a 5 day course of Cipro last month and the symptoms improved temporarily. Harriet Pho, NP made aware of these findings. VO received to get a UA and culture today and she will send him in a RX for Bactrim. Pt made aware and understands. ALT of 247 also noted. Per Joseph Art, NP, ok to proceed with Avastin and Tecentriq today.   Alejandro Rice tolerated treatment without incident or complaint. VSS. Peripheral IV site noted for positive blood return prior to, during, and after infusions by 2 RNs. Discharged in satisfactory condition. Reminded to pick up antibiotic. Understanding verbalized.

## 2019-06-25 LAB — URINE CULTURE

## 2019-07-03 ENCOUNTER — Telehealth (HOSPITAL_COMMUNITY): Payer: Self-pay

## 2019-07-03 NOTE — Telephone Encounter (Signed)
Nutrition Assessment:  Patient identified on Malnutrition Screening report for weight loss and poor appetite  Patient with stage IV hepatocellular cancer to bones and left adrenal gland.  Past medical history of hepc, COPD, HTN, afib, smoker  Called patient to introduce self and service at [facility].  Patient reports that his appetite comes and goes.  Reports that he is not really hungry but tries to eat anyway.  Reports portions are smaller than they should be.  Reports that he has been drinking 1-3 ensure plus per day.  Eats mostly easy to prepare foods, potted meat sandwich, deli meat sandwichs, italian meatballs, ravioli, broiled shrimp.  Likes eggs, peanut butter.  Most meals are prepared in the microwave if they have to be heated.    Denies nausea, some constipation.     Medications: folic acid, Vit D, MVI, compazine and prilosec  Labs: reviewed  Anthropometrics:   Height: 69 inches Weight: 144 lb Noted 151 lb (9/28) BMI: 21 5% weight loss in the last month, significant  Estimated Energy Needs  Kcals: 1950-2275 calories Protein: 98-113 g Fluid: > 1.9 L  NUTRITION DIAGNOSIS: Inadequate oral intake related to cancer, poor appetite as evidenced by 5% weight los in the last month   INTERVENTION:  Encouraged patient to continue drinking 3 ensure daily to prevent further weight loss. Discussed ways to add calories to current eating pattern.  Encouraged patient to set schedule to eat (q 2 hours) Patient contact information provided    MONITORING, EVALUATION, GOAL: Patient will consume adequate calories and protein to prevent further weight loss   NEXT VISIT: phone Dec 4th  Mikale Silversmith B. Zenia Resides, [place], [place] Registered Dietitian [phone] (pager)

## 2019-07-06 ENCOUNTER — Encounter (HOSPITAL_COMMUNITY): Payer: Self-pay | Admitting: Emergency Medicine

## 2019-07-06 ENCOUNTER — Emergency Department (HOSPITAL_COMMUNITY)
Admission: EM | Admit: 2019-07-06 | Discharge: 2019-07-07 | Disposition: A | Discharge status: Home / Self Care | Payer: Medicare HMO | Attending: Emergency Medicine | Admitting: Emergency Medicine

## 2019-07-06 ENCOUNTER — Other Ambulatory Visit: Payer: Self-pay

## 2019-07-06 ENCOUNTER — Emergency Department (HOSPITAL_COMMUNITY): Payer: Medicare HMO

## 2019-07-06 DIAGNOSIS — N390 Urinary tract infection, site not specified: Secondary | ICD-10-CM | POA: Insufficient documentation

## 2019-07-06 DIAGNOSIS — J441 Chronic obstructive pulmonary disease with (acute) exacerbation: Secondary | ICD-10-CM

## 2019-07-06 DIAGNOSIS — Z79899 Other long term (current) drug therapy: Secondary | ICD-10-CM | POA: Insufficient documentation

## 2019-07-06 DIAGNOSIS — R300 Dysuria: Secondary | ICD-10-CM | POA: Diagnosis present

## 2019-07-06 DIAGNOSIS — Z7982 Long term (current) use of aspirin: Secondary | ICD-10-CM | POA: Diagnosis not present

## 2019-07-06 DIAGNOSIS — C220 Liver cell carcinoma: Secondary | ICD-10-CM | POA: Diagnosis not present

## 2019-07-06 DIAGNOSIS — I10 Essential (primary) hypertension: Secondary | ICD-10-CM | POA: Insufficient documentation

## 2019-07-06 DIAGNOSIS — F17210 Nicotine dependence, cigarettes, uncomplicated: Secondary | ICD-10-CM | POA: Insufficient documentation

## 2019-07-06 DIAGNOSIS — N3001 Acute cystitis with hematuria: Secondary | ICD-10-CM

## 2019-07-06 DIAGNOSIS — Z20828 Contact with and (suspected) exposure to other viral communicable diseases: Secondary | ICD-10-CM | POA: Insufficient documentation

## 2019-07-06 DIAGNOSIS — R0602 Shortness of breath: Secondary | ICD-10-CM | POA: Insufficient documentation

## 2019-07-06 DIAGNOSIS — Z950 Presence of cardiac pacemaker: Secondary | ICD-10-CM | POA: Insufficient documentation

## 2019-07-06 LAB — CBC WITH DIFFERENTIAL/PLATELET
Abs Immature Granulocytes: 0.03 10*3/uL (ref 0.00–0.07)
Basophils Absolute: 0.1 10*3/uL (ref 0.0–0.1)
Basophils Relative: 1 %
Eosinophils Absolute: 0 10*3/uL (ref 0.0–0.5)
Eosinophils Relative: 0 %
HCT: 55 % — ABNORMAL HIGH (ref 39.0–52.0)
Hemoglobin: 17.7 g/dL — ABNORMAL HIGH (ref 13.0–17.0)
Immature Granulocytes: 0 %
Lymphocytes Relative: 19 %
Lymphs Abs: 1.7 10*3/uL (ref 0.7–4.0)
MCH: 32.7 pg (ref 26.0–34.0)
MCHC: 32.2 g/dL (ref 30.0–36.0)
MCV: 101.5 fL — ABNORMAL HIGH (ref 80.0–100.0)
Monocytes Absolute: 1 10*3/uL (ref 0.1–1.0)
Monocytes Relative: 11 %
Neutro Abs: 6.2 10*3/uL (ref 1.7–7.7)
Neutrophils Relative %: 69 %
Platelets: 339 10*3/uL (ref 150–400)
RBC: 5.42 MIL/uL (ref 4.22–5.81)
RDW: 13.4 % (ref 11.5–15.5)
WBC: 9 10*3/uL (ref 4.0–10.5)
nRBC: 0 % (ref 0.0–0.2)

## 2019-07-06 LAB — URINALYSIS, ROUTINE W REFLEX MICROSCOPIC
Bilirubin Urine: NEGATIVE
Bilirubin Urine: NEGATIVE
Glucose, UA: NEGATIVE mg/dL
Glucose, UA: NEGATIVE mg/dL
Ketones, ur: NEGATIVE mg/dL
Ketones, ur: NEGATIVE mg/dL
Nitrite: NEGATIVE
Nitrite: NEGATIVE
Protein, ur: 100 mg/dL — AB
Protein, ur: 100 mg/dL — AB
RBC / HPF: >50 RBC/hpf — ABNORMAL HIGH (ref 0–5)
RBC / HPF: >50 RBC/hpf — ABNORMAL HIGH (ref 0–5)
Specific Gravity, Urine: 1.02 (ref 1.005–1.030)
Specific Gravity, Urine: 1.019 (ref 1.005–1.030)
WBC, UA: >50 WBC/hpf — ABNORMAL HIGH (ref 0–5)
WBC, UA: >50 WBC/hpf — ABNORMAL HIGH (ref 0–5)
pH: 5 (ref 5.0–8.0)
pH: 5 (ref 5.0–8.0)

## 2019-07-06 LAB — BLOOD GAS, ARTERIAL
Acid-Base Excess: 1.8 mmol/L (ref 0.0–2.0)
Bicarbonate: 26.4 mmol/L (ref 20.0–28.0)
FIO2: 21
O2 Saturation: 93.8 %
Patient temperature: 36.7
pCO2 arterial: 34.6 mmHg (ref 32.0–48.0)
pH, Arterial: 7.474 — ABNORMAL HIGH (ref 7.350–7.450)
pO2, Arterial: 69 mmHg — ABNORMAL LOW (ref 83.0–108.0)

## 2019-07-06 LAB — BASIC METABOLIC PANEL
Anion gap: 8 (ref 5–15)
BUN: 28 mg/dL — ABNORMAL HIGH (ref 8–23)
CO2: 27 mmol/L (ref 22–32)
Calcium: 9.7 mg/dL (ref 8.9–10.3)
Chloride: 100 mmol/L (ref 98–111)
Creatinine, Ser: 0.9 mg/dL (ref 0.61–1.24)
GFR calc Af Amer: >60 mL/min (ref >60)
GFR calc non Af Amer: >60 mL/min (ref >60)
Glucose, Bld: 117 mg/dL — ABNORMAL HIGH (ref 70–99)
Potassium: 4.6 mmol/L (ref 3.5–5.1)
Sodium: 135 mmol/L (ref 135–145)

## 2019-07-06 LAB — BRAIN NATRIURETIC PEPTIDE: B Natriuretic Peptide: 941 pg/mL — ABNORMAL HIGH (ref 0.0–100.0)

## 2019-07-06 MED ORDER — CEPHALEXIN 500 MG PO CAPS: 500.0000 mg | ORAL_CAPSULE | Freq: Four times a day (QID) | ORAL | 0 refills | Status: DC

## 2019-07-06 MED ORDER — ALBUTEROL SULFATE HFA 108 (90 BASE) MCG/ACT IN AERS
4.0000 | INHALATION_SPRAY | Freq: Once | RESPIRATORY_TRACT | Status: AC
  Administered 2019-07-06: 4 via RESPIRATORY_TRACT
  Filled 2019-07-06: qty 6.7

## 2019-07-06 MED ORDER — PREDNISONE 20 MG PO TABS: 40.0000 mg | ORAL_TABLET | Freq: Every day | ORAL | 0 refills | Status: DC

## 2019-07-06 MED ORDER — SODIUM CHLORIDE 0.9 % IV SOLN
1.0000 g | Freq: Once | INTRAVENOUS | Status: AC
  Administered 2019-07-06: 1 g via INTRAVENOUS
  Filled 2019-07-06: qty 10

## 2019-07-06 NOTE — ED Provider Notes (Signed)
Received patient at change of shift. Patient is a 63 year old male who presents to the emergency department with recurrent urinary tract infection. Patient has a chronic indwelling Foley catheter.  The current catheter has only been changed 1 time since January 2020.  Patient complains of pain and also complains of pain of the urethra.  During the course of the work-up the patient was noted to be hypoxic.  This improved after albuterol.  Patient stated that he had run out of his albuterol inhaler.  Work-up is ongoing. New Foley catheter was inserted.  Repeat urine analysis pending. Patient being observed for any desaturation or respiratory difficulty.  Repeat urine analysis is consistent with a urinary tract infection.  IV Rocephin has been given.  A culture has been sent to the lab.  The patient does not want to ambulate.  He says that he wears an artificial leg on the left and he has a great deal of difficulty getting the prosthesis on and off.  He says he feels fine.  The patient is speaking in complete sentences.  There is symmetrical rise of fall of the chest.  Patient has coarse breath sounds on auscultation, but no active wheezing at this time.  The patient has an albuterol inhaler given while here in the hospital.  Prescription for short course of steroid will be given to the patient to use.  Patient will also be given a prescription for Keflex for his urinary tract infection.  Patient is to return to the emergency department if any changes in his condition, problems, or concerns.   Dx. 1 urinary tract infection 2.  Exacerbation of COPD   Lily Kocher, Hershal Coria 07/07/19 0004    Noemi Chapel, MD 07/07/19 [phone]
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
## 2019-07-06 NOTE — ED Triage Notes (Signed)
Pt c/o of dysuria, foul odor and discolored. Was treated for a UTI and finish his ABX 4 days ago.

## 2019-07-07 LAB — URINE CULTURE: Special Requests: NORMAL

## 2019-07-07 LAB — SARS CORONAVIRUS 2 (TAT 6-24 HRS): SARS Coronavirus 2: NEGATIVE

## 2019-07-07 MED ORDER — PREDNISONE 20 MG PO TABS: 40.0000 mg | ORAL_TABLET | Freq: Every day | ORAL | 0 refills | Status: DC

## 2019-07-07 NOTE — Discharge Instructions (Addendum)
Your tests reveal urinary tract infection.  Please use Keflex with breakfast, lunch, dinner, and at bedtime until all taken.  Your oxygen saturation dropped a few times while here in the emergency department.  Your examination suggested an exacerbation of your chronic lung disease.  Please use albuterol 2 puffs every 4 hours.  Please use 2 tablets of prednisone daily until all taken.  Please see Dr. Marlou Sa in the office for follow-up and recheck.  Please monitor the schedule for your Foley catheter to be changed closely.

## 2019-07-08 ENCOUNTER — Ambulatory Visit: Payer: Medicare HMO | Admitting: Urology

## 2019-07-10 LAB — URINE CULTURE: Culture: >=100000 — AB

## 2019-07-11 ENCOUNTER — Telehealth (HOSPITAL_COMMUNITY): Payer: Self-pay | Admitting: Pharmacist

## 2019-07-11 ENCOUNTER — Telehealth: Payer: Self-pay | Admitting: Emergency Medicine

## 2019-07-11 NOTE — Progress Notes (Signed)
ED Antimicrobial Stewardship Positive Culture Follow Up   Alejandro Rice is an 63 y.o. male who presented to [facility] on (Not on file) with a chief complaint of No chief complaint on file.   Recent Results (from the past 720 hour(s))  Urine culture     Status: Abnormal   Collection Time: 06/22/19 10:58 AM   Specimen: Urine, Clean Catch  Result Value Ref Range Status   Specimen Description   Final    URINE, CLEAN CATCH Performed at [facility], [address], [place], [place] [address]    Special Requests   Final    NONE Performed at [facility], [address], [place], [place] [address]    Culture MULTIPLE SPECIES PRESENT, SUGGEST RECOLLECTION (A)  Final   Report Status 06/25/2019 FINAL  Final  Urine culture     Status: Abnormal   Collection Time: 07/06/19  5:32 PM   Specimen: Urine, Random  Result Value Ref Range Status   Specimen Description   Final    URINE, RANDOM Performed at [facility], [address], [place], [place] [address]    Special Requests   Final    Normal Performed at [facility], [address], [place], [place] [address]    Culture MULTIPLE SPECIES PRESENT, SUGGEST RECOLLECTION (A)  Final   Report Status 07/07/2019 FINAL  Final  SARS CORONAVIRUS 2 (TAT 6-24 HRS) Nasopharyngeal Nasopharyngeal Swab     Status: None   Collection Time: 07/06/19  6:26 PM   Specimen: Nasopharyngeal Swab  Result Value Ref Range Status   SARS Coronavirus 2 NEGATIVE NEGATIVE Final    Comment: (NOTE) SARS-CoV-2 target nucleic acids are NOT DETECTED. The SARS-CoV-2 RNA is generally detectable in upper and lower respiratory specimens during the acute phase of infection. Negative results do not preclude SARS-CoV-2 infection, do not rule out co-infections with other pathogens, and should not be used as the sole basis for treatment or other patient management decisions. Negative results must be combined with clinical observations, patient history, and epidemiological  information. The expected result is Negative. Fact Sheet for Patients: SugarRoll.be Fact Sheet for Healthcare Providers: https://www.woods-mathews.com/ This test is not yet approved or cleared by the [place] FDA and  has been authorized for detection and/or diagnosis of SARS-CoV-2 by FDA under an Emergency Use Authorization (EUA). This EUA will remain  in effect (meaning this test can be used) for the duration of the COVID-19 declaration under Section 56 4(b)(1) of the Act, 21 U.S.C. section 360bbb-3(b)(1), unless the authorization is terminated or revoked sooner. Performed at [facility], [place] [address], [place], [place] [address]   Urine Culture     Status: Abnormal   Collection Time: 07/06/19  9:00 PM   Specimen: Urine, Catheterized  Result Value Ref Range Status   Specimen Description   Final    URINE, CATHETERIZED Performed at [facility], [address] [place], [place] [address]    Special Requests   Final    NONE Performed at [facility], [address] [place], [place] [address]    Culture >=100,000 COLONIES/mL ENTEROCOCCUS FAECALIS (A)  Final   Report Status 07/10/2019 FINAL  Final   Organism ID, Bacteria ENTEROCOCCUS FAECALIS (A)  Final      Susceptibility   Enterococcus faecalis - MIC*    AMPICILLIN <=2 SENSITIVE Sensitive     LEVOFLOXACIN 1 SENSITIVE Sensitive     NITROFURANTOIN <=16 SENSITIVE Sensitive     VANCOMYCIN 1 SENSITIVE Sensitive     * >=  100,000 COLONIES/mL ENTEROCOCCUS FAECALIS    [x]  Treated with Cephalexin (Keflex), organism resistant to prescribed antimicrobial []  Patient discharged originally without antimicrobial agent and treatment is now indicated  New antibiotic prescription: Amoxicillin 500 mg po BID x 7 days  ED Provider: B. Laurette Schimke 07/11/2019, 9:59 AM Clinical Pharmacist Monday - Friday phone -  [phone] Saturday - Sunday phone - [phone]

## 2019-07-11 NOTE — Telephone Encounter (Signed)
Post ED Visit - Positive Culture Follow-up: Unsuccessful Patient Follow-up  Culture assessed and recommendations reviewed by:  []  Elenor Quinones, Pharm.D. []  Heide Guile, Pharm.D., BCPS AQ-ID []  Parks Neptune, Pharm.D., BCPS []  Alycia Rossetti, Pharm.D., BCPS []  [place], Pharm.D., BCPS, AAHIVP []  Legrand Como, Pharm.D., BCPS, AAHIVP [x]  Willis Modena, PharmD []  Vincenza Hews, PharmD, BCPS  Positive urine culture  []  Patient discharged without antimicrobial prescription and treatment is now indicated [x]  Organism is resistant to prescribed ED discharge antimicrobial []  Patient with positive blood cultures   Unable to contact patient @ phone number on file, letter will be sent to address on file   Milus Mallick 07/11/2019, 4:15 PM

## 2019-07-13 ENCOUNTER — Inpatient Hospital Stay (HOSPITAL_BASED_OUTPATIENT_CLINIC_OR_DEPARTMENT_OTHER): Payer: Medicare HMO | Admitting: Hematology

## 2019-07-13 ENCOUNTER — Encounter (HOSPITAL_COMMUNITY): Payer: Self-pay | Admitting: Hematology

## 2019-07-13 ENCOUNTER — Other Ambulatory Visit: Payer: Self-pay

## 2019-07-13 ENCOUNTER — Inpatient Hospital Stay (HOSPITAL_COMMUNITY): Payer: Medicare HMO

## 2019-07-13 ENCOUNTER — Inpatient Hospital Stay (HOSPITAL_COMMUNITY): Payer: Medicare HMO | Attending: Hematology

## 2019-07-13 VITALS — BP 114/70 | HR 67 | Temp 97.5°F | Resp 18 | Wt 144.8 lb

## 2019-07-13 DIAGNOSIS — Z79899 Other long term (current) drug therapy: Secondary | ICD-10-CM | POA: Diagnosis not present

## 2019-07-13 DIAGNOSIS — I4891 Unspecified atrial fibrillation: Secondary | ICD-10-CM | POA: Insufficient documentation

## 2019-07-13 DIAGNOSIS — Z5112 Encounter for antineoplastic immunotherapy: Secondary | ICD-10-CM | POA: Insufficient documentation

## 2019-07-13 DIAGNOSIS — C7972 Secondary malignant neoplasm of left adrenal gland: Secondary | ICD-10-CM | POA: Insufficient documentation

## 2019-07-13 DIAGNOSIS — C7951 Secondary malignant neoplasm of bone: Secondary | ICD-10-CM | POA: Insufficient documentation

## 2019-07-13 DIAGNOSIS — M545 Low back pain: Secondary | ICD-10-CM | POA: Insufficient documentation

## 2019-07-13 DIAGNOSIS — Z7901 Long term (current) use of anticoagulants: Secondary | ICD-10-CM | POA: Insufficient documentation

## 2019-07-13 DIAGNOSIS — C220 Liver cell carcinoma: Secondary | ICD-10-CM

## 2019-07-13 DIAGNOSIS — R319 Hematuria, unspecified: Secondary | ICD-10-CM | POA: Insufficient documentation

## 2019-07-13 LAB — COMPREHENSIVE METABOLIC PANEL
ALT: 362 U/L — ABNORMAL HIGH (ref 0–44)
AST: 51 U/L — ABNORMAL HIGH (ref 15–41)
Albumin: 3 g/dL — ABNORMAL LOW (ref 3.5–5.0)
Alkaline Phosphatase: 154 U/L — ABNORMAL HIGH (ref 38–126)
Anion gap: 8 (ref 5–15)
BUN: 19 mg/dL (ref 8–23)
CO2: 28 mmol/L (ref 22–32)
Calcium: 9.1 mg/dL (ref 8.9–10.3)
Chloride: 99 mmol/L (ref 98–111)
Creatinine, Ser: 0.73 mg/dL (ref 0.61–1.24)
GFR calc Af Amer: >60 mL/min (ref >60)
GFR calc non Af Amer: >60 mL/min (ref >60)
Glucose, Bld: 106 mg/dL — ABNORMAL HIGH (ref 70–99)
Potassium: 4.2 mmol/L (ref 3.5–5.1)
Sodium: 135 mmol/L (ref 135–145)
Total Bilirubin: 0.5 mg/dL (ref 0.3–1.2)
Total Protein: 7 g/dL (ref 6.5–8.1)

## 2019-07-13 LAB — CBC WITH DIFFERENTIAL/PLATELET
Abs Immature Granulocytes: 0.2 10*3/uL — ABNORMAL HIGH (ref 0.00–0.07)
Basophils Absolute: 0 10*3/uL (ref 0.0–0.1)
Basophils Relative: 0 %
Eosinophils Absolute: 0.1 10*3/uL (ref 0.0–0.5)
Eosinophils Relative: 1 %
HCT: 42.7 % (ref 39.0–52.0)
Hemoglobin: 14 g/dL (ref 13.0–17.0)
Immature Granulocytes: 2 %
Lymphocytes Relative: 11 %
Lymphs Abs: 1.2 10*3/uL (ref 0.7–4.0)
MCH: 33.5 pg (ref 26.0–34.0)
MCHC: 32.8 g/dL (ref 30.0–36.0)
MCV: 102.2 fL — ABNORMAL HIGH (ref 80.0–100.0)
Monocytes Absolute: 1 10*3/uL (ref 0.1–1.0)
Monocytes Relative: 9 %
Neutro Abs: 8.4 10*3/uL — ABNORMAL HIGH (ref 1.7–7.7)
Neutrophils Relative %: 77 %
Platelets: 320 10*3/uL (ref 150–400)
RBC: 4.18 MIL/uL — ABNORMAL LOW (ref 4.22–5.81)
RDW: 13.9 % (ref 11.5–15.5)
WBC: 10.9 10*3/uL — ABNORMAL HIGH (ref 4.0–10.5)
nRBC: 0.2 % (ref 0.0–0.2)

## 2019-07-13 LAB — MAGNESIUM: Magnesium: 2 mg/dL (ref 1.7–2.4)

## 2019-07-13 LAB — LACTATE DEHYDROGENASE: LDH: 166 U/L (ref 98–192)

## 2019-07-13 MED ORDER — SODIUM CHLORIDE 0.9% FLUSH: 10.0000 mL | INTRAVENOUS | Status: DC | PRN

## 2019-07-13 MED ORDER — SODIUM CHLORIDE 0.9 % IV SOLN
Freq: Once | INTRAVENOUS | Status: AC
  Administered 2019-07-13: 11:00:00 via INTRAVENOUS

## 2019-07-13 MED ORDER — DIPHENHYDRAMINE HCL 25 MG PO CAPS
25.0000 mg | ORAL_CAPSULE | Freq: Once | ORAL | Status: AC
  Administered 2019-07-13: 25 mg via ORAL
  Filled 2019-07-13: qty 1

## 2019-07-13 MED ORDER — SODIUM CHLORIDE 0.9 % IV SOLN
1200.0000 mg | Freq: Once | INTRAVENOUS | Status: AC
  Administered 2019-07-13: 1200 mg via INTRAVENOUS
  Filled 2019-07-13: qty 20

## 2019-07-13 MED ORDER — SODIUM CHLORIDE 0.9 % IV SOLN
15.0000 mg/kg | Freq: Once | INTRAVENOUS | Status: AC
  Administered 2019-07-13: 1000 mg via INTRAVENOUS
  Filled 2019-07-13: qty 8

## 2019-07-13 MED ORDER — HEPARIN SOD (PORK) LOCK FLUSH 100 UNIT/ML IV SOLN: 500.0000 [IU] | Freq: Once | INTRAVENOUS | Status: DC | PRN

## 2019-07-13 MED ORDER — ACETAMINOPHEN 325 MG PO TABS
650.0000 mg | ORAL_TABLET | Freq: Once | ORAL | Status: AC
  Administered 2019-07-13: 650 mg via ORAL
  Filled 2019-07-13: qty 2

## 2019-07-13 NOTE — Patient Instructions (Signed)
[facility] Discharge Instructions for Patients Receiving Chemotherapy  Today you received the following chemotherapy agents   To help prevent nausea and vomiting after your treatment, we encourage you to take your nausea medication   If you develop nausea and vomiting that is not controlled by your nausea medication, call the clinic.   BELOW ARE SYMPTOMS THAT SHOULD BE REPORTED IMMEDIATELY:  *FEVER GREATER THAN 100.5 F  *CHILLS WITH OR WITHOUT FEVER  NAUSEA AND VOMITING THAT IS NOT CONTROLLED WITH YOUR NAUSEA MEDICATION  *UNUSUAL SHORTNESS OF BREATH  *UNUSUAL BRUISING OR BLEEDING  TENDERNESS IN MOUTH AND THROAT WITH OR WITHOUT PRESENCE OF ULCERS  *URINARY PROBLEMS  *BOWEL PROBLEMS  UNUSUAL RASH Items with * indicate a potential emergency and should be followed up as soon as possible.  Feel free to call the clinic should you have any questions or concerns. The clinic phone number is [phone].  Please show the CHEMO ALERT CARD at check-in to the Emergency Department and triage nurse.   

## 2019-07-13 NOTE — Patient Instructions (Addendum)
[facility] at [facility] Discharge Instructions  You were seen today by Dr. Delton Coombes. He went over your recent lab results. He will schedule you for repeat scans to evaluate treatment response. He will see you back in 3 weeks for labs, treatment and follow up.   Thank you for choosing [place] [facility] to provide your oncology and hematology care.  To afford each patient quality time with our provider, please arrive at least 15 minutes before your scheduled appointment time.   If you have a lab appointment with the [facility] please come in thru the  Main Entrance and check in at the main information desk  You need to re-schedule your appointment should you arrive 10 or more minutes late.  We strive to give you quality time with our providers, and arriving late affects you and other patients whose appointments are after yours.  Also, if you no show three or more times for appointments you may be dismissed from the clinic at the providers discretion.     Again, thank you for choosing [facility].  Our hope is that these requests will decrease the amount of time that you wait before being seen by our physicians.       _____________________________________________________________  Should you have questions after your visit to [facility], please contact our office at [phone] between the hours of 8:00 a.m. and 4:30 p.m.  Voicemails left after 4:00 p.m. will not be returned until the following business day.  For prescription refill requests, have your pharmacy contact our office and allow 72 hours.    Cancer Center Support Programs:   > Cancer Support Group  2nd Tuesday of the month 1pm-2pm, Journey Room
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
## 2019-07-13 NOTE — Progress Notes (Signed)
Labs reviewed by Dr. Delton Coombes. Message received to proceed with treatment today. MAR reviewed. Pt has no complaints of any changes since the last visit.   Treatment given today per MD orders. Tolerated infusion without adverse affects. Vital signs stable. No complaints at this time. Discharged from clinic via wheel chair. F/U with [facility] as scheduled.

## 2019-07-21 ENCOUNTER — Telehealth: Payer: Self-pay | Admitting: Emergency Medicine

## 2019-07-29 ENCOUNTER — Ambulatory Visit (HOSPITAL_COMMUNITY): Admission: RE | Admit: 2019-07-29 | Payer: Medicare HMO | Source: Ambulatory Visit

## 2019-08-03 ENCOUNTER — Inpatient Hospital Stay (HOSPITAL_COMMUNITY): Payer: Medicare HMO

## 2019-08-03 ENCOUNTER — Inpatient Hospital Stay (HOSPITAL_BASED_OUTPATIENT_CLINIC_OR_DEPARTMENT_OTHER): Payer: Medicare HMO | Admitting: Hematology

## 2019-08-03 ENCOUNTER — Other Ambulatory Visit: Payer: Self-pay

## 2019-08-03 ENCOUNTER — Encounter (HOSPITAL_COMMUNITY): Payer: Self-pay | Admitting: Hematology

## 2019-08-03 VITALS — BP 125/78 | HR 89 | Temp 97.4°F | Resp 18 | Wt 137.8 lb

## 2019-08-03 VITALS — BP 103/62 | HR 69 | Temp 97.8°F

## 2019-08-03 DIAGNOSIS — C220 Liver cell carcinoma: Secondary | ICD-10-CM

## 2019-08-03 DIAGNOSIS — Z5112 Encounter for antineoplastic immunotherapy: Secondary | ICD-10-CM | POA: Diagnosis not present

## 2019-08-03 LAB — TSH: TSH: 1.717 u[IU]/mL (ref 0.350–4.500)

## 2019-08-03 LAB — CBC WITH DIFFERENTIAL/PLATELET
Abs Immature Granulocytes: 0.02 10*3/uL (ref 0.00–0.07)
Basophils Absolute: 0.1 10*3/uL (ref 0.0–0.1)
Basophils Relative: 1 %
Eosinophils Absolute: 0.2 10*3/uL (ref 0.0–0.5)
Eosinophils Relative: 2 %
HCT: 52.2 % — ABNORMAL HIGH (ref 39.0–52.0)
Hemoglobin: 17 g/dL (ref 13.0–17.0)
Immature Granulocytes: 0 %
Lymphocytes Relative: 37 %
Lymphs Abs: 2.8 10*3/uL (ref 0.7–4.0)
MCH: 33.8 pg (ref 26.0–34.0)
MCHC: 32.6 g/dL (ref 30.0–36.0)
MCV: 103.8 fL — ABNORMAL HIGH (ref 80.0–100.0)
Monocytes Absolute: 0.9 10*3/uL (ref 0.1–1.0)
Monocytes Relative: 12 %
Neutro Abs: 3.6 10*3/uL (ref 1.7–7.7)
Neutrophils Relative %: 48 %
Platelets: 323 10*3/uL (ref 150–400)
RBC: 5.03 MIL/uL (ref 4.22–5.81)
RDW: 14.6 % (ref 11.5–15.5)
WBC: 7.5 10*3/uL (ref 4.0–10.5)
nRBC: 0 % (ref 0.0–0.2)

## 2019-08-03 LAB — COMPREHENSIVE METABOLIC PANEL
ALT: 192 U/L — ABNORMAL HIGH (ref 0–44)
AST: 43 U/L — ABNORMAL HIGH (ref 15–41)
Albumin: 3.4 g/dL — ABNORMAL LOW (ref 3.5–5.0)
Alkaline Phosphatase: 150 U/L — ABNORMAL HIGH (ref 38–126)
Anion gap: 14 (ref 5–15)
BUN: 15 mg/dL (ref 8–23)
CO2: 26 mmol/L (ref 22–32)
Calcium: 9.2 mg/dL (ref 8.9–10.3)
Chloride: 95 mmol/L — ABNORMAL LOW (ref 98–111)
Creatinine, Ser: 0.94 mg/dL (ref 0.61–1.24)
GFR calc Af Amer: >60 mL/min (ref >60)
GFR calc non Af Amer: >60 mL/min (ref >60)
Glucose, Bld: 133 mg/dL — ABNORMAL HIGH (ref 70–99)
Potassium: 4.3 mmol/L (ref 3.5–5.1)
Sodium: 135 mmol/L (ref 135–145)
Total Bilirubin: 0.7 mg/dL (ref 0.3–1.2)
Total Protein: 7.9 g/dL (ref 6.5–8.1)

## 2019-08-03 LAB — MAGNESIUM: Magnesium: 1.9 mg/dL (ref 1.7–2.4)

## 2019-08-03 LAB — LACTATE DEHYDROGENASE: LDH: 204 U/L — ABNORMAL HIGH (ref 98–192)

## 2019-08-03 MED ORDER — DIPHENHYDRAMINE HCL 25 MG PO CAPS
ORAL_CAPSULE | ORAL | Status: AC
  Filled 2019-08-03: qty 1

## 2019-08-03 MED ORDER — HEPARIN SOD (PORK) LOCK FLUSH 100 UNIT/ML IV SOLN: 500.0000 [IU] | Freq: Once | INTRAVENOUS | Status: DC | PRN

## 2019-08-03 MED ORDER — SODIUM CHLORIDE 0.9 % IV SOLN
1200.0000 mg | Freq: Once | INTRAVENOUS | Status: AC
  Administered 2019-08-03: 1200 mg via INTRAVENOUS
  Filled 2019-08-03: qty 20

## 2019-08-03 MED ORDER — ACETAMINOPHEN 325 MG PO TABS
ORAL_TABLET | ORAL | Status: AC
  Filled 2019-08-03: qty 2

## 2019-08-03 MED ORDER — SODIUM CHLORIDE 0.9% FLUSH: 10.0000 mL | INTRAVENOUS | Status: DC | PRN

## 2019-08-03 MED ORDER — DIPHENHYDRAMINE HCL 25 MG PO CAPS
25.0000 mg | ORAL_CAPSULE | Freq: Once | ORAL | Status: AC
  Administered 2019-08-03: 25 mg via ORAL

## 2019-08-03 MED ORDER — SODIUM CHLORIDE 0.9 % IV SOLN
Freq: Once | INTRAVENOUS | Status: AC
  Administered 2019-08-03: 10:00:00 via INTRAVENOUS

## 2019-08-03 MED ORDER — ACETAMINOPHEN 325 MG PO TABS
650.0000 mg | ORAL_TABLET | Freq: Once | ORAL | Status: AC
  Administered 2019-08-03: 650 mg via ORAL

## 2019-08-03 MED ORDER — TRAMADOL HCL 50 MG PO TABS: 50.0000 mg | ORAL_TABLET | Freq: Two times a day (BID) | ORAL | 0 refills | Status: AC | PRN

## 2019-08-03 MED ORDER — SODIUM CHLORIDE 0.9 % IV SOLN
15.0000 mg/kg | Freq: Once | INTRAVENOUS | Status: AC
  Administered 2019-08-03: 1000 mg via INTRAVENOUS
  Filled 2019-08-03: qty 8

## 2019-08-03 NOTE — Progress Notes (Signed)
Patient presents today for treatment and follow up visit. MAR reviewed. Pt states he finished his 10 day cycle of Prednisone over a week ago. Message received from [facility] LPN to proceed with treatment.  Treatment given today per MD orders. Tolerated infusion without adverse affects. Vital signs stable. No complaints at this time. Discharged from clinic ambulatory. F/U with [facility] as scheduled.

## 2019-08-03 NOTE — Assessment & Plan Note (Signed)
1.  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 -Previous history of hep C, treated with Harvoni, reportedly cured. -Left adrenal biopsy on 05/08/2019 shows metastatic hepatocellular carcinoma. -CT CAP on 05/27/2019 shows interval progression of the left adrenal mass measuring 8 cm compared to 5.2 cm on the previous scan.  Tumor extends into the left adrenal vein tracking into the confluence with the left renal vein.  Similar appearance of sacral lesion.  Stable right hilar lymphadenopathy. -3 cycles of bevacizumab and atezolizumab from 06/01/2019 through 07/13/2019. -He missed his CT scan appointments.  He did not experience any immunotherapy related side effects. -I have reviewed his labs.  His elevated liver enzymes have improved. -We will proceed with cycle 4 today.  I plan to repeat CT CAP prior to next visit.  I will also obtain AFP level.   2.  Hematuria: -He has indwelling Foley catheter which could not be discontinued. -He is on alfuzosin managed by urology.  3.  A. fib: -He is on Xarelto.  If he develops profuse hematuria, he will stop it.  4.  Low back pain: -This is likely coming from his sacral tumor.  He reports pain waking up at nighttime. -We will start him on tramadol 50 mg twice daily as needed.

## 2019-08-03 NOTE — Patient Instructions (Addendum)
[facility] at [facility] Discharge Instructions  You were seen today by Dr. Katragadda. He went over your recent lab results. He will see you back in 3 weeks for labs, treatment, scans and follow up.   Thank you for choosing [facility] at [facility] to provide your oncology and hematology care.  To afford each patient quality time with our provider, please arrive at least 15 minutes before your scheduled appointment time.   If you have a lab appointment with the Cancer Center please come in thru the  Main Entrance and check in at the main information desk  You need to re-schedule your appointment should you arrive 10 or more minutes late.  We strive to give you quality time with our providers, and arriving late affects you and other patients whose appointments are after yours.  Also, if you no show three or more times for appointments you may be dismissed from the clinic at the providers discretion.     Again, thank you for choosing [facility].  Our hope is that these requests will decrease the amount of time that you wait before being seen by our physicians.       _____________________________________________________________  Should you have questions after your visit to [facility], please contact our office at [phone] between the hours of 8:00 a.m. and 4:30 p.m.  Voicemails left after 4:00 p.m. will not be returned until the following business day.  For prescription refill requests, have your pharmacy contact our office and allow 72 hours.    Cancer Center Support Programs:   > Cancer Support Group  2nd Tuesday of the month 1pm-2pm, Journey Room    

## 2019-08-03 NOTE — Patient Instructions (Signed)
[facility] Discharge Instructions for Patients Receiving Chemotherapy  Today you received the following chemotherapy agents   To help prevent nausea and vomiting after your treatment, we encourage you to take your nausea medication   If you develop nausea and vomiting that is not controlled by your nausea medication, call the clinic.   BELOW ARE SYMPTOMS THAT SHOULD BE REPORTED IMMEDIATELY:  *FEVER GREATER THAN 100.5 F  *CHILLS WITH OR WITHOUT FEVER  NAUSEA AND VOMITING THAT IS NOT CONTROLLED WITH YOUR NAUSEA MEDICATION  *UNUSUAL SHORTNESS OF BREATH  *UNUSUAL BRUISING OR BLEEDING  TENDERNESS IN MOUTH AND THROAT WITH OR WITHOUT PRESENCE OF ULCERS  *URINARY PROBLEMS  *BOWEL PROBLEMS  UNUSUAL RASH Items with * indicate a potential emergency and should be followed up as soon as possible.  Feel free to call the clinic should you have any questions or concerns. The clinic phone number is [phone].  Please show the CHEMO ALERT CARD at check-in to the Emergency Department and triage nurse.   
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
## 2019-08-06 LAB — T4: T4, Total: 7.2 ug/dL (ref 4.5–12.0)

## 2019-08-06 LAB — AFP TUMOR MARKER: AFP, Serum, Tumor Marker: 6 ng/mL (ref 0.0–8.3)

## 2019-08-07 ENCOUNTER — Ambulatory Visit (HOSPITAL_COMMUNITY): Payer: Medicare HMO

## 2019-08-07 NOTE — Progress Notes (Signed)
Nutrition  Called patient several different times today for nutrition follow.  No answer or option to leave voicemail message.  Triniti Gruetzmacher B. Zenia Resides, [place], [place] Registered Dietitian [phone] (pager)

## 2019-08-09 ENCOUNTER — Encounter (HOSPITAL_COMMUNITY): Payer: Self-pay | Admitting: Hematology

## 2019-08-09 NOTE — Assessment & Plan Note (Signed)
1.  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 -Previous history of hep C, treated with Harvoni, reportedly cured. -Left adrenal biopsy on 05/08/2019 shows metastatic hepatocellular carcinoma. -CT CAP on 05/27/2019 shows interval progression of the left adrenal mass measuring 8 cm compared to 5.2 cm on the previous scan.  Tumor extends into the left adrenal vein tracking into the confluence with the left renal vein.  Similar appearance of sacral lesion.  Stable right hilar lymphadenopathy. -Cycle 1 of bevacizumab and atezolizumab on 06/01/2019. -He has tolerated cycle 2 on 06/22/2019 reasonably well. -He is drinking about 3 cans of boost per day. -I have reviewed his blood work.  He may proceed with cycle 3 without any dose changes.  I plan to repeat CT scans after this cycle for response evaluation.  2.  Hematuria: -He has an indwelling Foley catheter from ER visit on 02/05/2019 for urinary retention.  He had the catheter discontinued but ended up with Foley  the very next day. -He is on alfuzosin managed by urology.  3.  A. fib: -He is on Xarelto.  If he develops profuse hematuria, he will stop it.

## 2019-08-19 ENCOUNTER — Ambulatory Visit (HOSPITAL_COMMUNITY)
Admission: RE | Admit: 2019-08-19 | Discharge: 2019-08-19 | Disposition: A | Discharge status: Home / Self Care | Payer: Medicare HMO | Source: Ambulatory Visit | Attending: Hematology | Admitting: Hematology

## 2019-08-19 ENCOUNTER — Other Ambulatory Visit: Payer: Self-pay

## 2019-08-19 DIAGNOSIS — C220 Liver cell carcinoma: Secondary | ICD-10-CM | POA: Diagnosis present

## 2019-08-19 MED ORDER — IOHEXOL 300 MG/ML  SOLN
100.0000 mL | Freq: Once | INTRAMUSCULAR | Status: AC | PRN
  Administered 2019-08-19: 15:00:00 100 mL via INTRAVENOUS
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
## 2019-08-21 ENCOUNTER — Inpatient Hospital Stay (HOSPITAL_COMMUNITY): Payer: Medicare HMO | Attending: Hematology

## 2019-08-21 DIAGNOSIS — M545 Low back pain: Secondary | ICD-10-CM | POA: Insufficient documentation

## 2019-08-21 DIAGNOSIS — Z5112 Encounter for antineoplastic immunotherapy: Secondary | ICD-10-CM | POA: Insufficient documentation

## 2019-08-21 DIAGNOSIS — F329 Major depressive disorder, single episode, unspecified: Secondary | ICD-10-CM | POA: Insufficient documentation

## 2019-08-21 DIAGNOSIS — C7951 Secondary malignant neoplasm of bone: Secondary | ICD-10-CM | POA: Insufficient documentation

## 2019-08-21 DIAGNOSIS — I4891 Unspecified atrial fibrillation: Secondary | ICD-10-CM | POA: Insufficient documentation

## 2019-08-21 DIAGNOSIS — Z7901 Long term (current) use of anticoagulants: Secondary | ICD-10-CM | POA: Insufficient documentation

## 2019-08-21 DIAGNOSIS — R319 Hematuria, unspecified: Secondary | ICD-10-CM | POA: Insufficient documentation

## 2019-08-21 DIAGNOSIS — E279 Disorder of adrenal gland, unspecified: Secondary | ICD-10-CM | POA: Insufficient documentation

## 2019-08-21 DIAGNOSIS — C220 Liver cell carcinoma: Secondary | ICD-10-CM | POA: Insufficient documentation

## 2019-08-21 NOTE — Progress Notes (Signed)
Nutrition Follow-up:  Patient with stage IV hepatocellular cancer to bones and left adrenal gland.  Patient receiving bevacizumab and atezolizumab.    Spoke with patient today via phone.  Reports that appetite is still not that good. "I force myself to eat."  Reports that foods have an off taste.  Reports that he tries to eat 2 meals per day.  Drinks ensure 1-3 times per day.    Medications: reviewed  Labs: reviewed  Anthropometrics:   Weight 137 lb 12.8 oz on 11/30 decreased from 144 lb on 10/30   NUTRITION DIAGNOSIS: Inadequate oral intake continues    INTERVENTION:  Patient to drink 3 ensure plus daily. Offered case of ensure and declined at this time saying that he has enough right now.  Discussed strategies to help with taste change.    Patient has contact information  MONITORING, EVALUATION, GOAL: Patient will consume adequate calories and protein to prevent further weight loss   NEXT VISIT: phone f/u Jan 29  Nikholas Geffre B. Zenia Resides, [place], [place] Registered Dietitian [phone] (pager)

## 2019-08-24 ENCOUNTER — Inpatient Hospital Stay (HOSPITAL_BASED_OUTPATIENT_CLINIC_OR_DEPARTMENT_OTHER): Payer: Medicare HMO | Admitting: Hematology

## 2019-08-24 ENCOUNTER — Encounter (HOSPITAL_COMMUNITY): Payer: Self-pay | Admitting: Hematology

## 2019-08-24 ENCOUNTER — Inpatient Hospital Stay (HOSPITAL_COMMUNITY): Payer: Medicare HMO

## 2019-08-24 ENCOUNTER — Other Ambulatory Visit: Payer: Self-pay

## 2019-08-24 VITALS — BP 101/69 | HR 61 | Temp 96.8°F | Resp 18

## 2019-08-24 DIAGNOSIS — C220 Liver cell carcinoma: Secondary | ICD-10-CM

## 2019-08-24 DIAGNOSIS — F329 Major depressive disorder, single episode, unspecified: Secondary | ICD-10-CM | POA: Diagnosis not present

## 2019-08-24 DIAGNOSIS — E279 Disorder of adrenal gland, unspecified: Secondary | ICD-10-CM | POA: Diagnosis not present

## 2019-08-24 DIAGNOSIS — Z7901 Long term (current) use of anticoagulants: Secondary | ICD-10-CM | POA: Diagnosis not present

## 2019-08-24 DIAGNOSIS — C7951 Secondary malignant neoplasm of bone: Secondary | ICD-10-CM | POA: Diagnosis present

## 2019-08-24 DIAGNOSIS — Z5112 Encounter for antineoplastic immunotherapy: Secondary | ICD-10-CM | POA: Diagnosis present

## 2019-08-24 DIAGNOSIS — M545 Low back pain: Secondary | ICD-10-CM | POA: Diagnosis not present

## 2019-08-24 DIAGNOSIS — R319 Hematuria, unspecified: Secondary | ICD-10-CM | POA: Diagnosis not present

## 2019-08-24 DIAGNOSIS — I4891 Unspecified atrial fibrillation: Secondary | ICD-10-CM | POA: Diagnosis not present

## 2019-08-24 LAB — CBC WITH DIFFERENTIAL/PLATELET
Abs Immature Granulocytes: 0.03 10*3/uL (ref 0.00–0.07)
Basophils Absolute: 0.1 10*3/uL (ref 0.0–0.1)
Basophils Relative: 1 %
Eosinophils Absolute: 0.1 10*3/uL (ref 0.0–0.5)
Eosinophils Relative: 1 %
HCT: 53.7 % — ABNORMAL HIGH (ref 39.0–52.0)
Hemoglobin: 17.5 g/dL — ABNORMAL HIGH (ref 13.0–17.0)
Immature Granulocytes: 0 %
Lymphocytes Relative: 27 %
Lymphs Abs: 2.7 10*3/uL (ref 0.7–4.0)
MCH: 33.3 pg (ref 26.0–34.0)
MCHC: 32.6 g/dL (ref 30.0–36.0)
MCV: 102.1 fL — ABNORMAL HIGH (ref 80.0–100.0)
Monocytes Absolute: 1 10*3/uL (ref 0.1–1.0)
Monocytes Relative: 10 %
Neutro Abs: 6.2 10*3/uL (ref 1.7–7.7)
Neutrophils Relative %: 61 %
Platelets: 281 10*3/uL (ref 150–400)
RBC: 5.26 MIL/uL (ref 4.22–5.81)
RDW: 14.4 % (ref 11.5–15.5)
WBC: 10.1 10*3/uL (ref 4.0–10.5)
nRBC: 0 % (ref 0.0–0.2)

## 2019-08-24 LAB — URINALYSIS, DIPSTICK ONLY
Bilirubin Urine: NEGATIVE
Glucose, UA: NEGATIVE mg/dL
Ketones, ur: NEGATIVE mg/dL
Nitrite: POSITIVE — AB
Protein, ur: 100 mg/dL — AB
Specific Gravity, Urine: 1.025 (ref 1.005–1.030)
pH: 5 (ref 5.0–8.0)

## 2019-08-24 LAB — MAGNESIUM: Magnesium: 1.9 mg/dL (ref 1.7–2.4)

## 2019-08-24 LAB — COMPREHENSIVE METABOLIC PANEL
ALT: 264 U/L — ABNORMAL HIGH (ref 0–44)
AST: 43 U/L — ABNORMAL HIGH (ref 15–41)
Albumin: 3.2 g/dL — ABNORMAL LOW (ref 3.5–5.0)
Alkaline Phosphatase: 130 U/L — ABNORMAL HIGH (ref 38–126)
Anion gap: 15 (ref 5–15)
BUN: 18 mg/dL (ref 8–23)
CO2: 22 mmol/L (ref 22–32)
Calcium: 9 mg/dL (ref 8.9–10.3)
Chloride: 98 mmol/L (ref 98–111)
Creatinine, Ser: 0.78 mg/dL (ref 0.61–1.24)
GFR calc Af Amer: >60 mL/min (ref >60)
GFR calc non Af Amer: >60 mL/min (ref >60)
Glucose, Bld: 179 mg/dL — ABNORMAL HIGH (ref 70–99)
Potassium: 4.2 mmol/L (ref 3.5–5.1)
Sodium: 135 mmol/L (ref 135–145)
Total Bilirubin: 1 mg/dL (ref 0.3–1.2)
Total Protein: 7.7 g/dL (ref 6.5–8.1)

## 2019-08-24 MED ORDER — SODIUM CHLORIDE 0.9 % IV SOLN: Freq: Once | INTRAVENOUS | Status: AC

## 2019-08-24 MED ORDER — DIPHENHYDRAMINE HCL 25 MG PO CAPS
25.0000 mg | ORAL_CAPSULE | Freq: Once | ORAL | Status: AC
  Administered 2019-08-24: 25 mg via ORAL
  Filled 2019-08-24: qty 1

## 2019-08-24 MED ORDER — ACETAMINOPHEN 325 MG PO TABS
650.0000 mg | ORAL_TABLET | Freq: Once | ORAL | Status: AC
  Administered 2019-08-24: 650 mg via ORAL
  Filled 2019-08-24: qty 2

## 2019-08-24 MED ORDER — SODIUM CHLORIDE 0.9 % IV SOLN
15.0000 mg/kg | Freq: Once | INTRAVENOUS | Status: AC
  Administered 2019-08-24: 1000 mg via INTRAVENOUS
  Filled 2019-08-24: qty 32

## 2019-08-24 MED ORDER — CIPROFLOXACIN HCL 500 MG PO TABS: 500.0000 mg | ORAL_TABLET | Freq: Two times a day (BID) | ORAL | 0 refills | Status: DC

## 2019-08-24 MED ORDER — MIRTAZAPINE 30 MG PO TABS: 30.0000 mg | ORAL_TABLET | Freq: Every day | ORAL | 1 refills | Status: DC

## 2019-08-24 MED ORDER — SODIUM CHLORIDE 0.9 % IV SOLN
1200.0000 mg | Freq: Once | INTRAVENOUS | Status: AC
  Administered 2019-08-24: 1200 mg via INTRAVENOUS
  Filled 2019-08-24: qty 20
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
## 2019-08-24 NOTE — Patient Instructions (Addendum)
[place] at [facility] Discharge Instructions  You were seen today by Dr. Delton Coombes. He went over your recent scan and lab results. He recommends you getting radiation to your adrenal gland. He will refer you to radiation in [facility] for targeted radiation treatment. He will send in a new prescription for Remeron, this is an antidepressant that can also help with your appetite. He will see you back in 3 weeks for labs, treatment and follow up.   Thank you for choosing [place] at [facility] to provide your oncology and hematology care.  To afford each patient quality time with our provider, please arrive at least 15 minutes before your scheduled appointment time.   If you have a lab appointment with the [facility] please come in thru the  Main Entrance and check in at the main information desk  You need to re-schedule your appointment should you arrive 10 or more minutes late.  We strive to give you quality time with our providers, and arriving late affects you and other patients whose appointments are after yours.  Also, if you no show three or more times for appointments you may be dismissed from the clinic at the providers discretion.     Again, thank you for choosing [facility].  Our hope is that these requests will decrease the amount of time that you wait before being seen by our physicians.       _____________________________________________________________  Should you have questions after your visit to [facility], please contact our office at [phone] between the hours of 8:00 a.m. and 4:30 p.m.  Voicemails left after 4:00 p.m. will not be returned until the following business day.  For prescription refill requests, have your pharmacy contact our office and allow 72 hours.    Cancer Center Support Programs:   > Cancer Support Group  2nd Tuesday of the month 1pm-2pm, Journey Room

## 2019-08-24 NOTE — Assessment & Plan Note (Signed)
1.  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 -Previous history of hep C, treated with Harvoni, reportedly cured. -Left adrenal biopsy on 05/08/2019 shows metastatic hepatocellular carcinoma. -CT CAP on 05/27/2019 shows interval progression of the left adrenal mass measuring 8 cm compared to 5.2 cm on the previous scan.  Tumor extends into the left adrenal vein tracking into the confluence with the left renal vein.  Similar appearance of sacral lesion.  Stable right hilar lymphadenopathy. -4 cycles of bevacizumab and atezolizumab from 06/01/2019 through 08/03/2019. -AFP was within normal limits. -We reviewed results of the CT scan 08/19/2019.  Left adrenal lesion has slightly increased in size.  Lytic lesion in the sacrum is stable.  No new lesions seen. -I have recommended continuing systemic therapy and considering radiation therapy to the lesion. -He is agreeable to this option.   2.  Hematuria: -He has an indwelling Foley catheter which could not be discontinued.  He is on alfuzosin. -UA today consistent with infection.  I have given Cipro 500 mg twice daily for 7 days.  3.  A. fib: -He is on Xarelto.  If he develops profuse hematuria, he will stop it.  4.  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  5.  Decreased appetite: -He has decreased appetite and some depression.  He also has sleep problems. -I will start him on mirtazapine 30 mg at bedtime.

## 2019-08-24 NOTE — Progress Notes (Signed)
Treatment given per orders. Patient tolerated it well without problems. Vitals stable and discharged home from clinic via wheelchair Follow up as scheduled.  

## 2019-08-24 NOTE — Progress Notes (Signed)
Labs reviewed with MD today. Proceed as planned per MD. Urinalysis results noted by MD. Continue as planned.

## 2019-08-24 NOTE — Patient Instructions (Signed)
[facility] Discharge Instructions for Patients Receiving Chemotherapy  Today you received the following chemotherapy agents   To help prevent nausea and vomiting after your treatment, we encourage you to take your nausea medication   If you develop nausea and vomiting that is not controlled by your nausea medication, call the clinic.   BELOW ARE SYMPTOMS THAT SHOULD BE REPORTED IMMEDIATELY:  *FEVER GREATER THAN 100.5 F  *CHILLS WITH OR WITHOUT FEVER  NAUSEA AND VOMITING THAT IS NOT CONTROLLED WITH YOUR NAUSEA MEDICATION  *UNUSUAL SHORTNESS OF BREATH  *UNUSUAL BRUISING OR BLEEDING  TENDERNESS IN MOUTH AND THROAT WITH OR WITHOUT PRESENCE OF ULCERS  *URINARY PROBLEMS  *BOWEL PROBLEMS  UNUSUAL RASH Items with * indicate a potential emergency and should be followed up as soon as possible.  Feel free to call the clinic should you have any questions or concerns. The clinic phone number is [phone].  Please show the CHEMO ALERT CARD at check-in to the Emergency Department and triage nurse.   

## 2019-08-25 ENCOUNTER — Encounter: Payer: Self-pay | Admitting: Cardiovascular Disease

## 2019-08-25 ENCOUNTER — Ambulatory Visit (INDEPENDENT_AMBULATORY_CARE_PROVIDER_SITE_OTHER): Payer: Medicare HMO | Admitting: Cardiovascular Disease

## 2019-08-25 VITALS — BP 112/79 | HR 89 | Temp 97.3°F | Ht 69.0 in | Wt 136.0 lb

## 2019-08-25 DIAGNOSIS — I482 Chronic atrial fibrillation, unspecified: Secondary | ICD-10-CM

## 2019-08-25 LAB — AFP TUMOR MARKER: AFP, Serum, Tumor Marker: 5 ng/mL (ref 0.0–8.3)

## 2019-08-25 NOTE — Patient Instructions (Signed)

## 2019-08-26 ENCOUNTER — Telehealth: Payer: Self-pay | Admitting: Radiation Oncology

## 2019-08-26 NOTE — Telephone Encounter (Signed)
New Message:   Called and no answer unable to leave vcmail due to memory being full.

## 2019-08-26 NOTE — Telephone Encounter (Signed)
New message:   Per patient Alejandro Rice he will be going to [facility] for radiation treatment due to it being closer to his home.

## 2019-09-10 ENCOUNTER — Emergency Department (HOSPITAL_COMMUNITY)
Admission: EM | Admit: 2019-09-10 | Discharge: 2019-09-10 | Disposition: A | Discharge status: Home / Self Care | Payer: Medicare HMO | Attending: Emergency Medicine | Admitting: Emergency Medicine

## 2019-09-10 ENCOUNTER — Other Ambulatory Visit: Payer: Self-pay

## 2019-09-10 ENCOUNTER — Encounter (HOSPITAL_COMMUNITY): Payer: Self-pay | Admitting: Emergency Medicine

## 2019-09-10 DIAGNOSIS — Z7982 Long term (current) use of aspirin: Secondary | ICD-10-CM | POA: Diagnosis not present

## 2019-09-10 DIAGNOSIS — T83098A Other mechanical complication of other indwelling urethral catheter, initial encounter: Secondary | ICD-10-CM | POA: Diagnosis present

## 2019-09-10 DIAGNOSIS — T839XXA Unspecified complication of genitourinary prosthetic device, implant and graft, initial encounter: Secondary | ICD-10-CM

## 2019-09-10 DIAGNOSIS — I10 Essential (primary) hypertension: Secondary | ICD-10-CM | POA: Diagnosis not present

## 2019-09-10 DIAGNOSIS — Z8505 Personal history of malignant neoplasm of liver: Secondary | ICD-10-CM | POA: Insufficient documentation

## 2019-09-10 DIAGNOSIS — Z79899 Other long term (current) drug therapy: Secondary | ICD-10-CM | POA: Insufficient documentation

## 2019-09-10 DIAGNOSIS — N390 Urinary tract infection, site not specified: Secondary | ICD-10-CM

## 2019-09-10 DIAGNOSIS — J449 Chronic obstructive pulmonary disease, unspecified: Secondary | ICD-10-CM | POA: Diagnosis not present

## 2019-09-10 DIAGNOSIS — T83511A Infection and inflammatory reaction due to indwelling urethral catheter, initial encounter: Secondary | ICD-10-CM | POA: Diagnosis not present

## 2019-09-10 DIAGNOSIS — Z950 Presence of cardiac pacemaker: Secondary | ICD-10-CM | POA: Diagnosis not present

## 2019-09-10 DIAGNOSIS — F17210 Nicotine dependence, cigarettes, uncomplicated: Secondary | ICD-10-CM | POA: Insufficient documentation

## 2019-09-10 DIAGNOSIS — Y69 Unspecified misadventure during surgical and medical care: Secondary | ICD-10-CM | POA: Diagnosis not present

## 2019-09-10 DIAGNOSIS — I480 Paroxysmal atrial fibrillation: Secondary | ICD-10-CM | POA: Insufficient documentation

## 2019-09-10 LAB — URINALYSIS, ROUTINE W REFLEX MICROSCOPIC
Bilirubin Urine: NEGATIVE
Glucose, UA: NEGATIVE mg/dL
Ketones, ur: 5 mg/dL — AB
Nitrite: POSITIVE — AB
Protein, ur: 100 mg/dL — AB
RBC / HPF: >50 RBC/hpf — ABNORMAL HIGH (ref 0–5)
Specific Gravity, Urine: 1.027 (ref 1.005–1.030)
WBC, UA: >50 WBC/hpf — ABNORMAL HIGH (ref 0–5)
pH: 5 (ref 5.0–8.0)

## 2019-09-10 MED ORDER — SULFAMETHOXAZOLE-TRIMETHOPRIM 800-160 MG PO TABS: 1.0000 | ORAL_TABLET | Freq: Two times a day (BID) | ORAL | 0 refills | Status: AC

## 2019-09-10 MED ORDER — SULFAMETHOXAZOLE-TRIMETHOPRIM 800-160 MG PO TABS
1.0000 | ORAL_TABLET | Freq: Once | ORAL | Status: AC
  Administered 2019-09-10: 1 via ORAL
  Filled 2019-09-10: qty 1

## 2019-09-10 NOTE — ED Triage Notes (Signed)
Pt reports catheter leg bag leaking. Pt denies any urinary symptoms. Pt reports is currently being treated for UTI and took last dose of abx yesterday. Pt denies any fever, abd pain, urinary symptoms. Pt reports has had catheter since January 2020.
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
## 2019-09-10 NOTE — ED Notes (Signed)
Leg bag emptied and urine sample collected in triage.

## 2019-09-10 NOTE — Discharge Instructions (Addendum)
Take the entire course of the antibiotics prescribed.  Make sure you are drinking plenty of fluids.  Get rechecked for any fever, nausea, vomiting or new symptoms.  Plan to have your urine rechecked after the antibiotics are completed to ensure this infection is better.

## 2019-09-12 LAB — URINE CULTURE

## 2019-09-14 ENCOUNTER — Inpatient Hospital Stay (HOSPITAL_COMMUNITY): Payer: Medicare HMO

## 2019-09-14 ENCOUNTER — Encounter (HOSPITAL_COMMUNITY): Payer: Self-pay | Admitting: Hematology

## 2019-09-14 ENCOUNTER — Inpatient Hospital Stay (HOSPITAL_COMMUNITY): Payer: Medicare HMO | Attending: Hematology

## 2019-09-14 ENCOUNTER — Inpatient Hospital Stay (HOSPITAL_BASED_OUTPATIENT_CLINIC_OR_DEPARTMENT_OTHER): Payer: Medicare HMO | Admitting: Hematology

## 2019-09-14 ENCOUNTER — Other Ambulatory Visit: Payer: Self-pay

## 2019-09-14 VITALS — BP 130/70 | HR 72 | Temp 97.1°F | Resp 18

## 2019-09-14 DIAGNOSIS — C220 Liver cell carcinoma: Secondary | ICD-10-CM | POA: Insufficient documentation

## 2019-09-14 DIAGNOSIS — C7972 Secondary malignant neoplasm of left adrenal gland: Secondary | ICD-10-CM | POA: Diagnosis not present

## 2019-09-14 DIAGNOSIS — C7951 Secondary malignant neoplasm of bone: Secondary | ICD-10-CM | POA: Diagnosis not present

## 2019-09-14 DIAGNOSIS — Z79899 Other long term (current) drug therapy: Secondary | ICD-10-CM | POA: Diagnosis not present

## 2019-09-14 DIAGNOSIS — Z5112 Encounter for antineoplastic immunotherapy: Secondary | ICD-10-CM | POA: Diagnosis not present

## 2019-09-14 DIAGNOSIS — N390 Urinary tract infection, site not specified: Secondary | ICD-10-CM | POA: Diagnosis not present

## 2019-09-14 DIAGNOSIS — E875 Hyperkalemia: Secondary | ICD-10-CM | POA: Diagnosis not present

## 2019-09-14 DIAGNOSIS — M545 Low back pain: Secondary | ICD-10-CM | POA: Diagnosis not present

## 2019-09-14 DIAGNOSIS — I4891 Unspecified atrial fibrillation: Secondary | ICD-10-CM | POA: Diagnosis not present

## 2019-09-14 DIAGNOSIS — R319 Hematuria, unspecified: Secondary | ICD-10-CM | POA: Diagnosis not present

## 2019-09-14 LAB — CBC WITH DIFFERENTIAL/PLATELET
Abs Immature Granulocytes: 0.01 10*3/uL (ref 0.00–0.07)
Basophils Absolute: 0.1 10*3/uL (ref 0.0–0.1)
Basophils Relative: 1 %
Eosinophils Absolute: 0.5 10*3/uL (ref 0.0–0.5)
Eosinophils Relative: 7 %
HCT: 49.7 % (ref 39.0–52.0)
Hemoglobin: 16.2 g/dL (ref 13.0–17.0)
Immature Granulocytes: 0 %
Lymphocytes Relative: 29 %
Lymphs Abs: 1.9 10*3/uL (ref 0.7–4.0)
MCH: 34.4 pg — ABNORMAL HIGH (ref 26.0–34.0)
MCHC: 32.6 g/dL (ref 30.0–36.0)
MCV: 105.5 fL — ABNORMAL HIGH (ref 80.0–100.0)
Monocytes Absolute: 0.6 10*3/uL (ref 0.1–1.0)
Monocytes Relative: 10 %
Neutro Abs: 3.4 10*3/uL (ref 1.7–7.7)
Neutrophils Relative %: 53 %
Platelets: 222 10*3/uL (ref 150–400)
RBC: 4.71 MIL/uL (ref 4.22–5.81)
RDW: 14.6 % (ref 11.5–15.5)
WBC: 6.5 10*3/uL (ref 4.0–10.5)
nRBC: 0 % (ref 0.0–0.2)

## 2019-09-14 LAB — COMPREHENSIVE METABOLIC PANEL
ALT: 44 U/L (ref 0–44)
AST: 31 U/L (ref 15–41)
Albumin: 3.5 g/dL (ref 3.5–5.0)
Alkaline Phosphatase: 112 U/L (ref 38–126)
Anion gap: 9 (ref 5–15)
BUN: 21 mg/dL (ref 8–23)
CO2: 30 mmol/L (ref 22–32)
Calcium: 9.7 mg/dL (ref 8.9–10.3)
Chloride: 101 mmol/L (ref 98–111)
Creatinine, Ser: 0.92 mg/dL (ref 0.61–1.24)
GFR calc Af Amer: >60 mL/min (ref >60)
GFR calc non Af Amer: >60 mL/min (ref >60)
Glucose, Bld: 131 mg/dL — ABNORMAL HIGH (ref 70–99)
Potassium: 5.3 mmol/L — ABNORMAL HIGH (ref 3.5–5.1)
Sodium: 140 mmol/L (ref 135–145)
Total Bilirubin: 0.5 mg/dL (ref 0.3–1.2)
Total Protein: 7.8 g/dL (ref 6.5–8.1)

## 2019-09-14 LAB — TSH: TSH: 1.947 u[IU]/mL (ref 0.350–4.500)

## 2019-09-14 LAB — MAGNESIUM: Magnesium: 2.1 mg/dL (ref 1.7–2.4)

## 2019-09-14 MED ORDER — SODIUM POLYSTYRENE SULFONATE 15 GM/60ML PO SUSP
30.0000 g | Freq: Once | ORAL | Status: AC
  Administered 2019-09-14: 30 g via ORAL
  Filled 2019-09-14: qty 120

## 2019-09-14 MED ORDER — SODIUM CHLORIDE 0.9 % IV SOLN
900.0000 mg | Freq: Once | INTRAVENOUS | Status: AC
  Administered 2019-09-14: 900 mg via INTRAVENOUS
  Filled 2019-09-14: qty 4

## 2019-09-14 MED ORDER — HEPARIN SOD (PORK) LOCK FLUSH 100 UNIT/ML IV SOLN: 500.0000 [IU] | Freq: Once | INTRAVENOUS | Status: DC | PRN

## 2019-09-14 MED ORDER — SODIUM CHLORIDE 0.9 % IV SOLN
1200.0000 mg | Freq: Once | INTRAVENOUS | Status: AC
  Administered 2019-09-14: 1200 mg via INTRAVENOUS
  Filled 2019-09-14: qty 20

## 2019-09-14 MED ORDER — DIPHENHYDRAMINE HCL 25 MG PO CAPS
25.0000 mg | ORAL_CAPSULE | Freq: Once | ORAL | Status: AC
  Administered 2019-09-14: 25 mg via ORAL
  Filled 2019-09-14: qty 1

## 2019-09-14 MED ORDER — ACETAMINOPHEN 325 MG PO TABS
ORAL_TABLET | ORAL | Status: AC
  Filled 2019-09-14: qty 2

## 2019-09-14 MED ORDER — ACETAMINOPHEN 325 MG PO TABS
650.0000 mg | ORAL_TABLET | Freq: Once | ORAL | Status: AC
  Administered 2019-09-14: 650 mg via ORAL
  Filled 2019-09-14: qty 2

## 2019-09-14 MED ORDER — SODIUM CHLORIDE 0.9% FLUSH: 10.0000 mL | INTRAVENOUS | Status: DC | PRN

## 2019-09-14 MED ORDER — SODIUM CHLORIDE 0.9 % IV SOLN: Freq: Once | INTRAVENOUS | Status: AC

## 2019-09-14 NOTE — Progress Notes (Signed)
09/14/19  Adjust Zirabev dose to 900 mg (15 mg/kg)  based on new weight of 60.7 kg.  T.O. Dr Beckey Downing LPN/Ameliana Brashear Ronnald Ramp, PharmD

## 2019-09-14 NOTE — Patient Instructions (Addendum)
[facility] at [facility] Discharge Instructions  You were seen today by Dr. Katragadda. He went over your recent lab results. He will see you back in 3 weeks for labs, treatment and follow up.   Thank you for choosing [facility] at [facility] to provide your oncology and hematology care.  To afford each patient quality time with our provider, please arrive at least 15 minutes before your scheduled appointment time.   If you have a lab appointment with the Cancer Center please come in thru the  Main Entrance and check in at the main information desk  You need to re-schedule your appointment should you arrive 10 or more minutes late.  We strive to give you quality time with our providers, and arriving late affects you and other patients whose appointments are after yours.  Also, if you no show three or more times for appointments you may be dismissed from the clinic at the providers discretion.     Again, thank you for choosing [facility].  Our hope is that these requests will decrease the amount of time that you wait before being seen by our physicians.       _____________________________________________________________  Should you have questions after your visit to [facility], please contact our office at [phone] between the hours of 8:00 a.m. and 4:30 p.m.  Voicemails left after 4:00 p.m. will not be returned until the following business day.  For prescription refill requests, have your pharmacy contact our office and allow 72 hours.    Cancer Center Support Programs:   > Cancer Support Group  2nd Tuesday of the month 1pm-2pm, Journey Room    

## 2019-09-14 NOTE — Assessment & Plan Note (Signed)
1.  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 -Previous history of hep C, treated with Harvoni, reportedly cured. -Left adrenal biopsy on 05/08/2019 shows metastatic hepatocellular carcinoma. -CT CAP on 05/27/2019 shows interval progression of the left adrenal mass measuring 8 cm compared to 5.2 cm on the previous scan.  Tumor extends into the left adrenal vein tracking into the confluence with the left renal vein.  Similar appearance of sacral lesion.  Stable right hilar lymphadenopathy. -5 cycles of bevacizumab and atezolizumab from 06/01/2019 through 08/24/2019. -CT scan reviewed by me on 08/19/2019 showed left adrenal lesion increased in size.  Lytic lesions in the sacrum is stable.  No new lesions seen. -He was evaluated in the ER on 09/10/2019 for Foley bag leakage.  He was found to have questionable UTI and was given Bactrim.  He is still continuing it. -He was evaluated by Dr.Yanagihara and underwent simulation scans.  He will likely start radiation therapy this Friday. -I have reviewed his labs.  He will proceed with his next cycle today. -He will be seen back in 3 weeks for follow-up.   2.  Hematuria: -He has an indwelling Foley catheter which could not be discontinued.  He is on alfuzosin. -He has intermittent hematuria.  3.  Atrial fibrillation: -He is currently off of Xarelto because of hematuria.  4.  Lower back pain from sacral tumor: -He is taking tramadol 50 mg 1 tablet daily as needed.  5.  Decreased appetite: -I have started him on mirtazapine 30 mg at bedtime.  He has only taken 2 doses. -I have suggested him to take mirtazapine every night.  6.  Hyperkalemia: -His potassium today is 5.3. -We will give him Kayexalate 30 g today.
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
## 2019-09-14 NOTE — Progress Notes (Signed)
Patient presents today for treatment and follow up visit with Dr. Delton Coombes. Labs pending. Vital signs within parameters for treatment. Patient has no complaints of any changes since the last visit. Urine on 09/09/18 100 Protein. TSH 11/30 1.717.  Potassium 5.3 today. Message received to proceed with treatment. Order received to give patient 30Grams of Kayexalate to take home.   Treatment given today per MD orders. Tolerated infusion without adverse affects. Vital signs stable. No complaints at this time. Discharged from clinic via wheel chair.  F/U with [facility] as scheduled.

## 2019-09-14 NOTE — Patient Instructions (Signed)
[facility] Discharge Instructions for Patients Receiving Chemotherapy  Today you received the following chemotherapy agents   To help prevent nausea and vomiting after your treatment, we encourage you to take your nausea medication   If you develop nausea and vomiting that is not controlled by your nausea medication, call the clinic.   BELOW ARE SYMPTOMS THAT SHOULD BE REPORTED IMMEDIATELY:  *FEVER GREATER THAN 100.5 F  *CHILLS WITH OR WITHOUT FEVER  NAUSEA AND VOMITING THAT IS NOT CONTROLLED WITH YOUR NAUSEA MEDICATION  *UNUSUAL SHORTNESS OF BREATH  *UNUSUAL BRUISING OR BLEEDING  TENDERNESS IN MOUTH AND THROAT WITH OR WITHOUT PRESENCE OF ULCERS  *URINARY PROBLEMS  *BOWEL PROBLEMS  UNUSUAL RASH Items with * indicate a potential emergency and should be followed up as soon as possible.  Feel free to call the clinic should you have any questions or concerns. The clinic phone number is [phone].  Please show the CHEMO ALERT CARD at check-in to the Emergency Department and triage nurse.   

## 2019-10-02 ENCOUNTER — Inpatient Hospital Stay (HOSPITAL_COMMUNITY): Payer: Medicare HMO

## 2019-10-02 NOTE — Progress Notes (Signed)
Nutrition  Called patient times 2 during the day today for nutrition follow-up and no answer or option to leave voice mail. Will follow-up as able.   Sukari Grist B. Zenia Resides, [place], [place] Registered Dietitian [phone] (pager)

## 2019-10-05 ENCOUNTER — Ambulatory Visit (HOSPITAL_COMMUNITY): Payer: Medicare HMO | Admitting: Hematology

## 2019-10-05 ENCOUNTER — Ambulatory Visit (HOSPITAL_COMMUNITY): Payer: Medicare HMO

## 2019-10-05 ENCOUNTER — Other Ambulatory Visit (HOSPITAL_COMMUNITY): Payer: Medicare HMO

## 2019-10-12 ENCOUNTER — Inpatient Hospital Stay (HOSPITAL_BASED_OUTPATIENT_CLINIC_OR_DEPARTMENT_OTHER): Payer: Medicare HMO | Admitting: Hematology

## 2019-10-12 ENCOUNTER — Encounter (HOSPITAL_COMMUNITY): Payer: Self-pay | Admitting: Hematology

## 2019-10-12 ENCOUNTER — Inpatient Hospital Stay (HOSPITAL_COMMUNITY): Payer: Medicare HMO

## 2019-10-12 ENCOUNTER — Inpatient Hospital Stay (HOSPITAL_COMMUNITY): Payer: Medicare HMO | Attending: Hematology

## 2019-10-12 ENCOUNTER — Other Ambulatory Visit: Payer: Self-pay

## 2019-10-12 VITALS — BP 113/85 | HR 88 | Temp 97.3°F | Resp 20

## 2019-10-12 DIAGNOSIS — I4891 Unspecified atrial fibrillation: Secondary | ICD-10-CM | POA: Insufficient documentation

## 2019-10-12 DIAGNOSIS — R319 Hematuria, unspecified: Secondary | ICD-10-CM | POA: Insufficient documentation

## 2019-10-12 DIAGNOSIS — G893 Neoplasm related pain (acute) (chronic): Secondary | ICD-10-CM | POA: Insufficient documentation

## 2019-10-12 DIAGNOSIS — C7951 Secondary malignant neoplasm of bone: Secondary | ICD-10-CM | POA: Insufficient documentation

## 2019-10-12 DIAGNOSIS — C7972 Secondary malignant neoplasm of left adrenal gland: Secondary | ICD-10-CM | POA: Diagnosis not present

## 2019-10-12 DIAGNOSIS — C220 Liver cell carcinoma: Secondary | ICD-10-CM

## 2019-10-12 DIAGNOSIS — Z5112 Encounter for antineoplastic immunotherapy: Secondary | ICD-10-CM | POA: Insufficient documentation

## 2019-10-12 LAB — CBC WITH DIFFERENTIAL/PLATELET
Abs Immature Granulocytes: 0.02 10*3/uL (ref 0.00–0.07)
Basophils Absolute: 0.1 10*3/uL (ref 0.0–0.1)
Basophils Relative: 1 %
Eosinophils Absolute: 0.1 10*3/uL (ref 0.0–0.5)
Eosinophils Relative: 1 %
HCT: 49.5 % (ref 39.0–52.0)
Hemoglobin: 16.3 g/dL (ref 13.0–17.0)
Immature Granulocytes: 0 %
Lymphocytes Relative: 12 %
Lymphs Abs: 0.9 10*3/uL (ref 0.7–4.0)
MCH: 35.4 pg — ABNORMAL HIGH (ref 26.0–34.0)
MCHC: 32.9 g/dL (ref 30.0–36.0)
MCV: 107.6 fL — ABNORMAL HIGH (ref 80.0–100.0)
Monocytes Absolute: 0.9 10*3/uL (ref 0.1–1.0)
Monocytes Relative: 13 %
Neutro Abs: 5.1 10*3/uL (ref 1.7–7.7)
Neutrophils Relative %: 73 %
Platelets: 230 10*3/uL (ref 150–400)
RBC: 4.6 MIL/uL (ref 4.22–5.81)
RDW: 13.9 % (ref 11.5–15.5)
WBC: 7 10*3/uL (ref 4.0–10.5)
nRBC: 0 % (ref 0.0–0.2)

## 2019-10-12 LAB — COMPREHENSIVE METABOLIC PANEL
ALT: 33 U/L (ref 0–44)
AST: 36 U/L (ref 15–41)
Albumin: 3.7 g/dL (ref 3.5–5.0)
Alkaline Phosphatase: 120 U/L (ref 38–126)
Anion gap: 14 (ref 5–15)
BUN: 16 mg/dL (ref 8–23)
CO2: 27 mmol/L (ref 22–32)
Calcium: 9.2 mg/dL (ref 8.9–10.3)
Chloride: 101 mmol/L (ref 98–111)
Creatinine, Ser: 0.91 mg/dL (ref 0.61–1.24)
GFR calc Af Amer: >60 mL/min (ref >60)
GFR calc non Af Amer: >60 mL/min (ref >60)
Glucose, Bld: 121 mg/dL — ABNORMAL HIGH (ref 70–99)
Potassium: 4 mmol/L (ref 3.5–5.1)
Sodium: 142 mmol/L (ref 135–145)
Total Bilirubin: 1.1 mg/dL (ref 0.3–1.2)
Total Protein: 7.8 g/dL (ref 6.5–8.1)

## 2019-10-12 LAB — MAGNESIUM: Magnesium: 1.9 mg/dL (ref 1.7–2.4)

## 2019-10-12 MED ORDER — SODIUM CHLORIDE 0.9 % IV SOLN
1200.0000 mg | Freq: Once | INTRAVENOUS | Status: AC
  Administered 2019-10-12: 1200 mg via INTRAVENOUS
  Filled 2019-10-12: qty 20

## 2019-10-12 MED ORDER — SODIUM CHLORIDE 0.9 % IV SOLN
15.0000 mg/kg | Freq: Once | INTRAVENOUS | Status: AC
  Administered 2019-10-12: 900 mg via INTRAVENOUS
  Filled 2019-10-12: qty 32

## 2019-10-12 MED ORDER — DIPHENHYDRAMINE HCL 25 MG PO CAPS
25.0000 mg | ORAL_CAPSULE | Freq: Once | ORAL | Status: AC
  Administered 2019-10-12: 11:00:00 25 mg via ORAL
  Filled 2019-10-12: qty 1

## 2019-10-12 MED ORDER — SODIUM CHLORIDE 0.9 % IV SOLN: Freq: Once | INTRAVENOUS | Status: AC

## 2019-10-12 MED ORDER — ACETAMINOPHEN 325 MG PO TABS
650.0000 mg | ORAL_TABLET | Freq: Once | ORAL | Status: AC
  Administered 2019-10-12: 650 mg via ORAL
  Filled 2019-10-12: qty 2

## 2019-10-12 NOTE — Patient Instructions (Addendum)
[facility] Discharge Instructions  You were seen today by Dr. Katragadda. He went over your recent lab results. He will see you back in 3 weeks for labs, treatment and follow up.   Thank you for choosing [facility] at [facility] to provide your oncology and hematology care.  To afford each patient quality time with our provider, please arrive at least 15 minutes before your scheduled appointment time.   If you have a lab appointment with the Cancer Center please come in thru the  Main Entrance and check in at the main information desk  You need to re-schedule your appointment should you arrive 10 or more minutes late.  We strive to give you quality time with our providers, and arriving late affects you and other patients whose appointments are after yours.  Also, if you no show three or more times for appointments you may be dismissed from the clinic at the providers discretion.     Again, thank you for choosing [facility].  Our hope is that these requests will decrease the amount of time that you wait before being seen by our physicians.       _____________________________________________________________  Should you have questions after your visit to [facility], please contact our office at [phone] between the hours of 8:00 a.m. and 4:30 p.m.  Voicemails left after 4:00 p.m. will not be returned until the following business day.  For prescription refill requests, have your pharmacy contact our office and allow 72 hours.    Cancer Center Support Programs:   > Cancer Support Group  2nd Tuesday of the month 1pm-2pm, Journey Room    

## 2019-10-12 NOTE — Progress Notes (Signed)
[id number] Labs reviewed with and pt seen by Dr. Delton Coombes and pt approved for Bevacizumab and Tecentriq infusions today per MD          Alejandro Rice tolerated chemo tx well without complaints or incident. Peripheral IV site checked by 2 RN's with positive blood return noted prior to and after each chemo infusion. VSS upon discharge. Pt discharged via wheelchair in satisfactory condition

## 2019-10-12 NOTE — Patient Instructions (Signed)
[facility] Discharge Instructions for Patients Receiving Chemotherapy   Beginning January 23rd 2017 lab work for the [facility] will be done in the  Main lab at [facility] on 1st floor. If you have a lab appointment with the [place] please come in thru the  Main Entrance and check in at the main information desk   Today you received the following chemotherapy agents Bevacizumab and Tecentriq. Follow-up as scheduled. Call clinic for any questions or concerns  To help prevent nausea and vomiting after your treatment, we encourage you to take your nausea medication If you develop nausea and vomiting, or diarrhea that is not controlled by your medication, call the clinic.  The clinic phone number is [phone]. Office hours are Monday-Friday 8:30am-5:00pm.  BELOW ARE SYMPTOMS THAT SHOULD BE REPORTED IMMEDIATELY:  *FEVER GREATER THAN 101.0 F  *CHILLS WITH OR WITHOUT FEVER  NAUSEA AND VOMITING THAT IS NOT CONTROLLED WITH YOUR NAUSEA MEDICATION  *UNUSUAL SHORTNESS OF BREATH  *UNUSUAL BRUISING OR BLEEDING  TENDERNESS IN MOUTH AND THROAT WITH OR WITHOUT PRESENCE OF ULCERS  *URINARY PROBLEMS  *BOWEL PROBLEMS  UNUSUAL RASH Items with * indicate a potential emergency and should be followed up as soon as possible. If you have an emergency after office hours please contact your primary care physician or go to the nearest emergency department.  Please call the clinic during office hours if you have any questions or concerns.   You may also contact the Patient Navigator at [phone] should you have any questions or need assistance in obtaining follow up care.      Resources For Cancer Patients and their Caregivers ? American Cancer Society: Can assist with transportation, wigs, general needs, runs Look Good Feel Better.        802 851 1669 ? Cancer Care: Provides financial assistance, online support groups, medication/co-pay assistance.   1-800-813-HOPE 204-119-0251) ? [place] Assists [place] cancer patients and their families through emotional , educational and financial support.  716-878-0546 ? [place] DSS Where to apply for food stamps, Medicaid and utility assistance. 684-036-2149 ? [facility]: Transportation to medical appointments. (540)595-1842 ? Social Security Administration: May apply for disability if have a Stage IV cancer. (563)214-7476 210-083-5539 ? [employer], Disability and Transit Services: Assists with nutrition, care and transit needs. 204-430-3152
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
## 2019-10-12 NOTE — Progress Notes (Signed)
Patient has been assessed, vital signs and labs have been reviewed by Dr. Katragadda. ANC, Creatinine, LFTs, and Platelets are within treatment parameters per Dr. Katragadda. The patient is good to proceed with treatment at this time.  

## 2019-10-16 ENCOUNTER — Ambulatory Visit (HOSPITAL_COMMUNITY): Payer: Medicare HMO

## 2019-10-16 NOTE — Progress Notes (Signed)
Nutrition Follow-up:  Patient with stage IV hepatocellular cancer to bones and left adrenal gland.  Patient receiving bevacizumab and atezolizumab.    Spoke with patient today via phone.  Patient reports appetite is about the same.  Reports that he has been taking remeron as prescribed.  Reports that he continues to drink 2-3 ensure per day.  Had [place] food recently. Usually always gets a cheese dog (mayo, chili and onions) during the week.    Denies nausea.    Medications: remeron  Labs: glucose 121  Anthropometrics:   Weight 138 lb on 2/8 increased from 137 lb on 11/20.  Noted weight as low as 133 lb on 1/11.     NUTRITION DIAGNOSIS: Inadequate oral intake continues    INTERVENTION:  Patient will pick up case of ensure enlive on 3/1 at next appointment.  Encouraged patient to continue 2-3 per day Encouraged high calorie, high protein foods to prevent weight loss    MONITORING, EVALUATION, GOAL: patient will consume adequate calories and protein to prevent further weight loss   NEXT VISIT: phone f/u March 12th  Jaid Quirion B. Zenia Resides, [place], [place] Registered Dietitian [phone] (pager)

## 2019-10-17 NOTE — Assessment & Plan Note (Signed)
1.  Stage IV HCC metastatic to the bones and left adrenal: -6 cycles of bevacizumab at Atezolizumab from 06/01/2019 through 09/14/2019. -He finished radiation to the left adrenal gland last week.  He also received radiation to the pelvis. -He denies any immunotherapy related side effects.  Last TSH was normal at 1.9. -His last AFP was 5 on 08/24/2019.  Last CT on 08/19/2019 showed left adrenal lesion increased in size.  Lytic lesions in the sacrum are stable.  No new lesions. -I have reviewed his labs.  We will proceed with his cycle 6 today without any dose modifications.  I will see him back in 3 weeks for follow-up.  2.  Hematuria: -He has an indwelling Foley catheter which could not be discontinued.  He is on alfuzosin. -He has intermittent hematuria which resolved after he stopped taking Xarelto.  3.  Atrial fibrillation: -Xarelto is on hold secondary to hematuria.  4.  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  He reports improvement in pain after radiation therapy to the pelvis.  5.  Decreased appetite: -He will continue Remeron 30 mg at bedtime.  Slight improvement was seen in the appetite.

## 2019-10-18 ENCOUNTER — Other Ambulatory Visit (HOSPITAL_COMMUNITY): Payer: Self-pay | Admitting: Hematology

## 2019-11-02 ENCOUNTER — Ambulatory Visit (HOSPITAL_COMMUNITY): Payer: Medicare HMO | Admitting: Hematology

## 2019-11-02 ENCOUNTER — Inpatient Hospital Stay (HOSPITAL_COMMUNITY): Payer: Medicare HMO

## 2019-11-02 ENCOUNTER — Ambulatory Visit (HOSPITAL_COMMUNITY): Payer: Medicare HMO

## 2019-11-03 ENCOUNTER — Inpatient Hospital Stay (HOSPITAL_COMMUNITY): Payer: Medicare HMO | Attending: Hematology

## 2019-11-03 ENCOUNTER — Inpatient Hospital Stay (HOSPITAL_BASED_OUTPATIENT_CLINIC_OR_DEPARTMENT_OTHER): Payer: Medicare HMO | Admitting: Hematology

## 2019-11-03 ENCOUNTER — Encounter (HOSPITAL_COMMUNITY): Payer: Self-pay | Admitting: Hematology

## 2019-11-03 ENCOUNTER — Other Ambulatory Visit: Payer: Self-pay

## 2019-11-03 ENCOUNTER — Inpatient Hospital Stay (HOSPITAL_COMMUNITY): Payer: Medicare HMO

## 2019-11-03 VITALS — BP 115/77 | HR 88 | Temp 97.9°F | Resp 18

## 2019-11-03 VITALS — BP 139/99 | HR 45 | Temp 97.1°F | Resp 20 | Wt 137.2 lb

## 2019-11-03 DIAGNOSIS — Z5112 Encounter for antineoplastic immunotherapy: Secondary | ICD-10-CM | POA: Diagnosis present

## 2019-11-03 DIAGNOSIS — C220 Liver cell carcinoma: Secondary | ICD-10-CM

## 2019-11-03 DIAGNOSIS — R300 Dysuria: Secondary | ICD-10-CM | POA: Diagnosis not present

## 2019-11-03 DIAGNOSIS — M545 Low back pain: Secondary | ICD-10-CM | POA: Insufficient documentation

## 2019-11-03 DIAGNOSIS — C7951 Secondary malignant neoplasm of bone: Secondary | ICD-10-CM | POA: Diagnosis not present

## 2019-11-03 DIAGNOSIS — I4891 Unspecified atrial fibrillation: Secondary | ICD-10-CM | POA: Diagnosis not present

## 2019-11-03 DIAGNOSIS — R319 Hematuria, unspecified: Secondary | ICD-10-CM | POA: Insufficient documentation

## 2019-11-03 DIAGNOSIS — I10 Essential (primary) hypertension: Secondary | ICD-10-CM | POA: Insufficient documentation

## 2019-11-03 LAB — CBC WITH DIFFERENTIAL/PLATELET
Abs Immature Granulocytes: 0.04 10*3/uL (ref 0.00–0.07)
Basophils Absolute: 0.1 10*3/uL (ref 0.0–0.1)
Basophils Relative: 1 %
Eosinophils Absolute: 0.2 10*3/uL (ref 0.0–0.5)
Eosinophils Relative: 2 %
HCT: 54 % — ABNORMAL HIGH (ref 39.0–52.0)
Hemoglobin: 17.4 g/dL — ABNORMAL HIGH (ref 13.0–17.0)
Immature Granulocytes: 0 %
Lymphocytes Relative: 19 %
Lymphs Abs: 2 10*3/uL (ref 0.7–4.0)
MCH: 35.2 pg — ABNORMAL HIGH (ref 26.0–34.0)
MCHC: 32.2 g/dL (ref 30.0–36.0)
MCV: 109.1 fL — ABNORMAL HIGH (ref 80.0–100.0)
Monocytes Absolute: 1.5 10*3/uL — ABNORMAL HIGH (ref 0.1–1.0)
Monocytes Relative: 14 %
Neutro Abs: 6.9 10*3/uL (ref 1.7–7.7)
Neutrophils Relative %: 64 %
Platelets: 238 10*3/uL (ref 150–400)
RBC: 4.95 MIL/uL (ref 4.22–5.81)
RDW: 13.6 % (ref 11.5–15.5)
WBC: 10.8 10*3/uL — ABNORMAL HIGH (ref 4.0–10.5)
nRBC: 0 % (ref 0.0–0.2)

## 2019-11-03 LAB — URINALYSIS, ROUTINE W REFLEX MICROSCOPIC
Bacteria, UA: NONE SEEN
Bilirubin Urine: NEGATIVE
Glucose, UA: NEGATIVE mg/dL
Ketones, ur: NEGATIVE mg/dL
Nitrite: NEGATIVE
Protein, ur: 100 mg/dL — AB
RBC / HPF: >50 RBC/hpf — ABNORMAL HIGH (ref 0–5)
Specific Gravity, Urine: 1.016 (ref 1.005–1.030)
WBC, UA: >50 WBC/hpf — ABNORMAL HIGH (ref 0–5)
pH: 6 (ref 5.0–8.0)

## 2019-11-03 LAB — MAGNESIUM: Magnesium: 2.1 mg/dL (ref 1.7–2.4)

## 2019-11-03 LAB — URINALYSIS, DIPSTICK ONLY
Bilirubin Urine: NEGATIVE
Glucose, UA: NEGATIVE mg/dL
Ketones, ur: NEGATIVE mg/dL
Nitrite: NEGATIVE
Protein, ur: 100 mg/dL — AB
Specific Gravity, Urine: 1.018 (ref 1.005–1.030)
pH: 6 (ref 5.0–8.0)

## 2019-11-03 LAB — COMPREHENSIVE METABOLIC PANEL
ALT: 20 U/L (ref 0–44)
AST: 29 U/L (ref 15–41)
Albumin: 3.6 g/dL (ref 3.5–5.0)
Alkaline Phosphatase: 111 U/L (ref 38–126)
Anion gap: 14 (ref 5–15)
BUN: 18 mg/dL (ref 8–23)
CO2: 21 mmol/L — ABNORMAL LOW (ref 22–32)
Calcium: 9.3 mg/dL (ref 8.9–10.3)
Chloride: 100 mmol/L (ref 98–111)
Creatinine, Ser: 1 mg/dL (ref 0.61–1.24)
GFR calc Af Amer: >60 mL/min (ref >60)
GFR calc non Af Amer: >60 mL/min (ref >60)
Glucose, Bld: 112 mg/dL — ABNORMAL HIGH (ref 70–99)
Potassium: 4.2 mmol/L (ref 3.5–5.1)
Sodium: 135 mmol/L (ref 135–145)
Total Bilirubin: 0.8 mg/dL (ref 0.3–1.2)
Total Protein: 8.3 g/dL — ABNORMAL HIGH (ref 6.5–8.1)

## 2019-11-03 MED ORDER — CIPROFLOXACIN HCL 500 MG PO TABS: 500.0000 mg | ORAL_TABLET | Freq: Two times a day (BID) | ORAL | 0 refills | Status: AC

## 2019-11-03 MED ORDER — SODIUM CHLORIDE 0.9 % IV SOLN
15.0000 mg/kg | Freq: Once | INTRAVENOUS | Status: AC
  Administered 2019-11-03: 900 mg via INTRAVENOUS
  Filled 2019-11-03: qty 4

## 2019-11-03 MED ORDER — ACETAMINOPHEN 325 MG PO TABS
650.0000 mg | ORAL_TABLET | Freq: Once | ORAL | Status: AC
  Administered 2019-11-03: 650 mg via ORAL
  Filled 2019-11-03: qty 2

## 2019-11-03 MED ORDER — SODIUM CHLORIDE 0.9 % IV SOLN: Freq: Once | INTRAVENOUS | Status: AC

## 2019-11-03 MED ORDER — DIPHENHYDRAMINE HCL 25 MG PO CAPS
25.0000 mg | ORAL_CAPSULE | Freq: Once | ORAL | Status: AC
  Administered 2019-11-03: 25 mg via ORAL
  Filled 2019-11-03: qty 1

## 2019-11-03 MED ORDER — SODIUM CHLORIDE 0.9 % IV SOLN
1200.0000 mg | Freq: Once | INTRAVENOUS | Status: AC
  Administered 2019-11-03: 1200 mg via INTRAVENOUS
  Filled 2019-11-03: qty 20

## 2019-11-03 NOTE — Patient Instructions (Signed)
[facility] Discharge Instructions for Patients Receiving Chemotherapy  Today you received the following chemotherapy agents   To help prevent nausea and vomiting after your treatment, we encourage you to take your nausea medication   If you develop nausea and vomiting that is not controlled by your nausea medication, call the clinic.   BELOW ARE SYMPTOMS THAT SHOULD BE REPORTED IMMEDIATELY:  *FEVER GREATER THAN 100.5 F  *CHILLS WITH OR WITHOUT FEVER  NAUSEA AND VOMITING THAT IS NOT CONTROLLED WITH YOUR NAUSEA MEDICATION  *UNUSUAL SHORTNESS OF BREATH  *UNUSUAL BRUISING OR BLEEDING  TENDERNESS IN MOUTH AND THROAT WITH OR WITHOUT PRESENCE OF ULCERS  *URINARY PROBLEMS  *BOWEL PROBLEMS  UNUSUAL RASH Items with * indicate a potential emergency and should be followed up as soon as possible.  Feel free to call the clinic should you have any questions or concerns. The clinic phone number is [phone].  Please show the CHEMO ALERT CARD at check-in to the Emergency Department and triage nurse.   

## 2019-11-03 NOTE — Progress Notes (Signed)
Patient presents today for follow up visit with Dr. Delton Coombes and treatment. Vital signs are stable. Labs within parameters for treatment.    Message received from [facility] LPN/ Dr. Delton Coombes to proceed with treatment today. Per verbal order from [facility] LPN/ Dr. Delton Coombes obtain a UA/ C & S and send to the lab. Foley bag  noted to be dry. Tubing dry. Upon assessment patient states, " I urinate around the catheter and the last time this happened it was due to the foley was too small in size." Upon assessment the foley catheter is a 60 [place]. The patient has a Depends diaper on and states he voids around the foley." The patient obtained a urine sample by voiding in a cup. Discussed patient's complaints with Dr. Delton Coombes. Explained unable to get urine from the bag or tubing . Patient voided in a cup and sample sent to the lab. Patient denies any pelvic pain. Patient states, " I am emptying my bladder but not by this foley."   Treatment given today per MD orders. Tolerated infusion without adverse affects. Vital signs stable. No complaints at this time. Discharged from clinic via wheel chair. F/U with [facility] as scheduled.

## 2019-11-03 NOTE — Progress Notes (Signed)
Patient has been assessed, vital signs and labs have been reviewed by Dr. Katragadda. ANC, Creatinine, LFTs, and Platelets are within treatment parameters per Dr. Katragadda. The patient is good to proceed with treatment at this time.  

## 2019-11-03 NOTE — Assessment & Plan Note (Addendum)
1.  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 -Denies any immunotherapy related side effects.  Last TSH was normal at 1.9.  Last CT scans were reviewed from 08/19/2019. -We reviewed his labs.  White count and platelets are adequate.  Creatinine was normal. -He will proceed with his next cycle today.  Last AFP was 5 on 08/24/2019. -He will come back in 3 weeks for follow-up.  I plan to repeat CT CAP and AFP prior to next visit.  2.  Hematuria: -He has an indwelling Foley catheter.  He complained of pain around the catheter.  He also reports leakage of urine around the catheter. -We will make referral to urology.  I have checked his UA which was consistent with infection. -We will call in Cipro 500 mg twice daily for 5 days.  3.  Atrial fibrillation: -Xarelto on hold secondary to hematuria.  4.  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  He is using tramadol at bedtime as needed.  5.  Decreased appetite: -He will continue Remeron 30 mg at bedtime.  He lost about 1 pound since last visit.  We will continue to monitor him closely.
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
## 2019-11-03 NOTE — Patient Instructions (Addendum)
[place] at [facility] Discharge Instructions  You were seen today by Dr. Delton Coombes. He went over your recent lab results. He will refer you back to urologist to check your catheter. He will repeat your CT scans prior to your next visit. He will see you back in 3 weeks for labs, treatment and follow up.   Thank you for choosing [place] at [facility] to provide your oncology and hematology care.  To afford each patient quality time with our provider, please arrive at least 15 minutes before your scheduled appointment time.   If you have a lab appointment with the [facility] please come in thru the  Main Entrance and check in at the main information desk  You need to re-schedule your appointment should you arrive 10 or more minutes late.  We strive to give you quality time with our providers, and arriving late affects you and other patients whose appointments are after yours.  Also, if you no show three or more times for appointments you may be dismissed from the clinic at the providers discretion.     Again, thank you for choosing [facility].  Our hope is that these requests will decrease the amount of time that you wait before being seen by our physicians.       _____________________________________________________________  Should you have questions after your visit to [facility], please contact our office at [phone] between the hours of 8:00 a.m. and 4:30 p.m.  Voicemails left after 4:00 p.m. will not be returned until the following business day.  For prescription refill requests, have your pharmacy contact our office and allow 72 hours.    Cancer Center Support Programs:   > Cancer Support Group  2nd Tuesday of the month 1pm-2pm, Journey Room

## 2019-11-04 LAB — URINE CULTURE

## 2019-11-04 LAB — AFP TUMOR MARKER: AFP, Serum, Tumor Marker: 4.9 ng/mL (ref 0.0–8.3)

## 2019-11-04 MED ORDER — OCTREOTIDE ACETATE 30 MG IM KIT
PACK | INTRAMUSCULAR | Status: AC
  Filled 2019-11-04: qty 2

## 2019-11-04 MED ORDER — LANREOTIDE ACETATE 120 MG/0.5ML ~~LOC~~ SOLN
SUBCUTANEOUS | Status: AC
  Filled 2019-11-04: qty 120

## 2019-11-06 ENCOUNTER — Ambulatory Visit: Payer: Medicare HMO | Admitting: Urology

## 2019-11-12 ENCOUNTER — Other Ambulatory Visit: Payer: Self-pay

## 2019-11-12 ENCOUNTER — Encounter (HOSPITAL_COMMUNITY): Payer: Self-pay | Admitting: Emergency Medicine

## 2019-11-12 ENCOUNTER — Emergency Department (HOSPITAL_COMMUNITY)
Admission: EM | Admit: 2019-11-12 | Discharge: 2019-11-12 | Disposition: A | Discharge status: Home / Self Care | Payer: Medicare HMO | Attending: Emergency Medicine | Admitting: Emergency Medicine

## 2019-11-12 DIAGNOSIS — Z7982 Long term (current) use of aspirin: Secondary | ICD-10-CM | POA: Insufficient documentation

## 2019-11-12 DIAGNOSIS — I10 Essential (primary) hypertension: Secondary | ICD-10-CM | POA: Diagnosis not present

## 2019-11-12 DIAGNOSIS — N342 Other urethritis: Secondary | ICD-10-CM | POA: Insufficient documentation

## 2019-11-12 DIAGNOSIS — F17210 Nicotine dependence, cigarettes, uncomplicated: Secondary | ICD-10-CM | POA: Diagnosis not present

## 2019-11-12 DIAGNOSIS — Z978 Presence of other specified devices: Secondary | ICD-10-CM

## 2019-11-12 DIAGNOSIS — B3749 Other urogenital candidiasis: Secondary | ICD-10-CM | POA: Diagnosis not present

## 2019-11-12 DIAGNOSIS — Z950 Presence of cardiac pacemaker: Secondary | ICD-10-CM | POA: Insufficient documentation

## 2019-11-12 DIAGNOSIS — Z960 Presence of urogenital implants: Secondary | ICD-10-CM | POA: Insufficient documentation

## 2019-11-12 DIAGNOSIS — J449 Chronic obstructive pulmonary disease, unspecified: Secondary | ICD-10-CM | POA: Diagnosis not present

## 2019-11-12 DIAGNOSIS — Z8505 Personal history of malignant neoplasm of liver: Secondary | ICD-10-CM | POA: Diagnosis not present

## 2019-11-12 DIAGNOSIS — Z89512 Acquired absence of left leg below knee: Secondary | ICD-10-CM | POA: Diagnosis not present

## 2019-11-12 DIAGNOSIS — R300 Dysuria: Secondary | ICD-10-CM | POA: Diagnosis present

## 2019-11-12 LAB — URINALYSIS, ROUTINE W REFLEX MICROSCOPIC
Bilirubin Urine: NEGATIVE
Glucose, UA: NEGATIVE mg/dL
Ketones, ur: NEGATIVE mg/dL
Nitrite: NEGATIVE
Protein, ur: >=300 mg/dL — AB
Specific Gravity, Urine: 1.014 (ref 1.005–1.030)
pH: 8 (ref 5.0–8.0)

## 2019-11-12 LAB — BASIC METABOLIC PANEL
Anion gap: 11 (ref 5–15)
BUN: 29 mg/dL — ABNORMAL HIGH (ref 8–23)
CO2: 27 mmol/L (ref 22–32)
Calcium: 9.2 mg/dL (ref 8.9–10.3)
Chloride: 96 mmol/L — ABNORMAL LOW (ref 98–111)
Creatinine, Ser: 0.91 mg/dL (ref 0.61–1.24)
GFR calc Af Amer: >60 mL/min (ref >60)
GFR calc non Af Amer: >60 mL/min (ref >60)
Glucose, Bld: 128 mg/dL — ABNORMAL HIGH (ref 70–99)
Potassium: 4.5 mmol/L (ref 3.5–5.1)
Sodium: 134 mmol/L — ABNORMAL LOW (ref 135–145)

## 2019-11-12 LAB — CBC WITH DIFFERENTIAL/PLATELET
Abs Immature Granulocytes: 0.06 10*3/uL (ref 0.00–0.07)
Basophils Absolute: 0.1 10*3/uL (ref 0.0–0.1)
Basophils Relative: 1 %
Eosinophils Absolute: 0.1 10*3/uL (ref 0.0–0.5)
Eosinophils Relative: 1 %
HCT: 45.4 % (ref 39.0–52.0)
Hemoglobin: 15 g/dL (ref 13.0–17.0)
Immature Granulocytes: 1 %
Lymphocytes Relative: 6 %
Lymphs Abs: 0.6 10*3/uL — ABNORMAL LOW (ref 0.7–4.0)
MCH: 34.6 pg — ABNORMAL HIGH (ref 26.0–34.0)
MCHC: 33 g/dL (ref 30.0–36.0)
MCV: 104.8 fL — ABNORMAL HIGH (ref 80.0–100.0)
Monocytes Absolute: 1.1 10*3/uL — ABNORMAL HIGH (ref 0.1–1.0)
Monocytes Relative: 10 %
Neutro Abs: 9.1 10*3/uL — ABNORMAL HIGH (ref 1.7–7.7)
Neutrophils Relative %: 81 %
Platelets: 246 10*3/uL (ref 150–400)
RBC: 4.33 MIL/uL (ref 4.22–5.81)
RDW: 13.2 % (ref 11.5–15.5)
WBC: 11 10*3/uL — ABNORMAL HIGH (ref 4.0–10.5)
nRBC: 0 % (ref 0.0–0.2)

## 2019-11-12 MED ORDER — NYSTATIN 100000 UNIT/GM EX CREA
TOPICAL_CREAM | Freq: Two times a day (BID) | CUTANEOUS | Status: DC
  Filled 2019-11-12: qty 15

## 2019-11-12 MED ORDER — NYSTATIN 100000 UNIT/GM EX CREA: TOPICAL_CREAM | CUTANEOUS | 2 refills | Status: AC

## 2019-11-12 NOTE — ED Triage Notes (Signed)
Pt has foley cath in place and reports burning and leaking around the cath tube.

## 2019-11-12 NOTE — ED Provider Notes (Signed)
Care of this patient was accepted at change of shift from Dr. Karle Starch.  On my exam the patient has an indwelling Foley catheter, this has been replaced, he now has a fresh clean catheter.  There does appear to be areas of skin breakdown around the penis scrotum perineum and the right hip.  He states that when he lays on his right side the urine tends to leak down into that area.  This is most consistent with a candidal type skin rash or dermatitis breakdown, nystatin has been applied here with a sterile dressing, he has been instructed on how to use this at home and refills of the medication were sent to his pharmacy.  I have instructed to follow-up with urology to determine whether he truly needs to have this catheter long-term.  He is agreeable, he does not appear to be ill, vital signs are reassuring and urinalysis does not appear overtly infected.  Culture sent.   Noemi Chapel, MD 11/12/19 1606

## 2019-11-12 NOTE — Discharge Instructions (Addendum)
See Dr. Junious Silk for follow up of the need to have your catheter - call today for next available appointment.  Apply the Nystatin ointment to your penis / scrotum and skin rash on the R hip -  ER for worsening symptoms.
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
## 2019-11-13 ENCOUNTER — Ambulatory Visit (HOSPITAL_COMMUNITY): Payer: Medicare HMO

## 2019-11-13 NOTE — Progress Notes (Signed)
Nutrition  Called patient for nutrition follow-up.  No answer.  Left message with call back number.   Taelyn Nemes B. Nathanael Krist, RD, LDN Registered Dietitian [phone] (pager)   

## 2019-11-15 LAB — AEROBIC CULTURE W GRAM STAIN (SUPERFICIAL SPECIMEN)

## 2019-11-16 ENCOUNTER — Telehealth: Payer: Self-pay

## 2019-11-16 LAB — URINE CULTURE: Culture: >=100000 — AB

## 2019-11-16 NOTE — Telephone Encounter (Signed)
No treatment for aerobic culture per Alecia Lemming PA ED 11/12/19

## 2019-11-18 ENCOUNTER — Ambulatory Visit (HOSPITAL_COMMUNITY): Admission: RE | Admit: 2019-11-18 | Payer: Medicare HMO | Source: Ambulatory Visit

## 2019-11-24 ENCOUNTER — Ambulatory Visit (HOSPITAL_COMMUNITY): Payer: Medicare HMO | Admitting: Hematology

## 2019-11-24 ENCOUNTER — Inpatient Hospital Stay (HOSPITAL_COMMUNITY): Payer: Medicare HMO

## 2019-11-24 ENCOUNTER — Ambulatory Visit (HOSPITAL_COMMUNITY): Payer: Medicare HMO

## 2019-12-01 ENCOUNTER — Other Ambulatory Visit: Payer: Self-pay | Admitting: Physician Assistant

## 2019-12-03 DEATH — deceased

## 2020-08-10 IMAGING — CT CT HEAD WITHOUT AND WITH CONTRAST
3 of 4 series · 15 of 47 positions shown, 18 images · IV contrast (omnipaque)
Comparison: PET-CT 02/20/2019 and earlier.

CLINICAL DATA: 63-year-old male with metastatic disease recently
evaluated by PET-CT. History of ablated hepatocellular carcinoma.

EXAM:
CT HEAD WITHOUT AND WITH CONTRAST
TECHNIQUE: Contiguous axial images were obtained from the base of the skull
through the vertex without and with intravenous contrast
CONTRAST:  75mL OMNIPAQUE IOHEXOL 300 MG/ML  SOLN

[Series 5: head w o · axial · 0.44mm/px · z∈[+43,+183]mm · 9 of 34 slices shown, 12 images]
[im 3/34  brain]
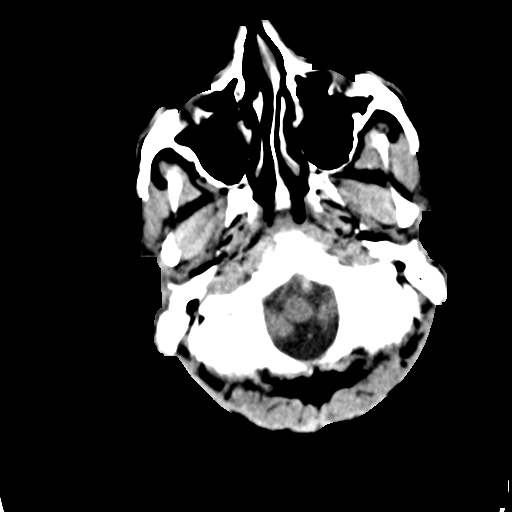
[im 3/34  bone]
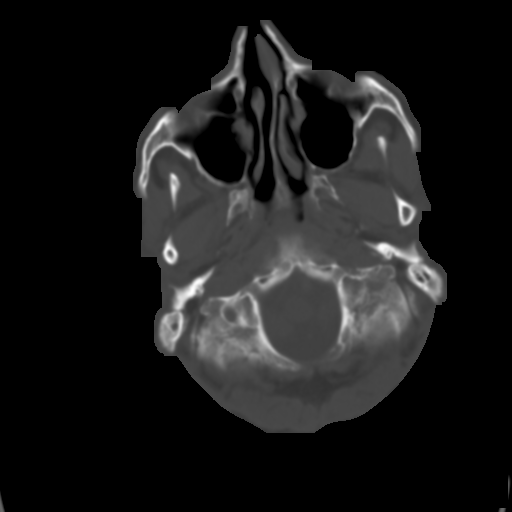
[im 8/34  brain]
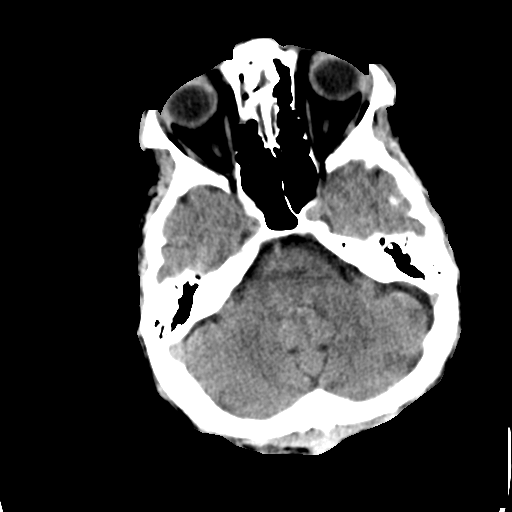
[im 10/34  brain]
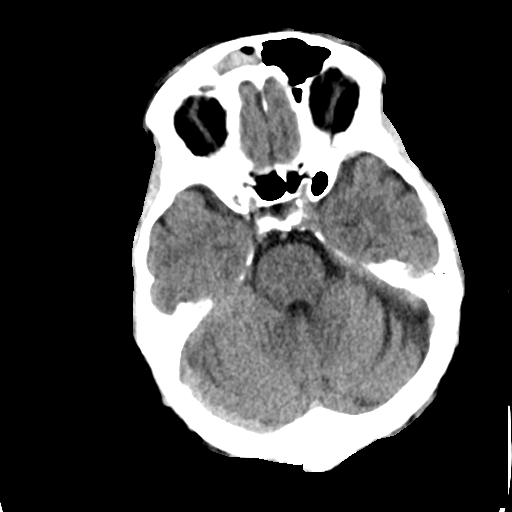
[im 15/34  brain]
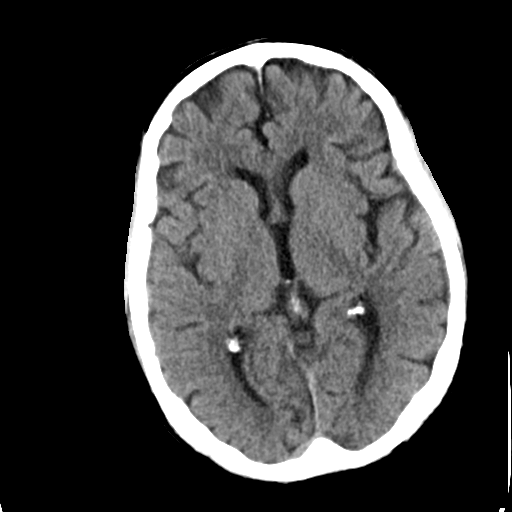
[im 17/34  brain]
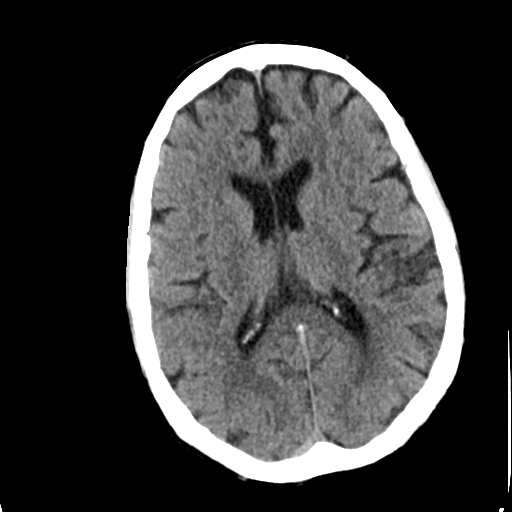
[im 17/34  bone]
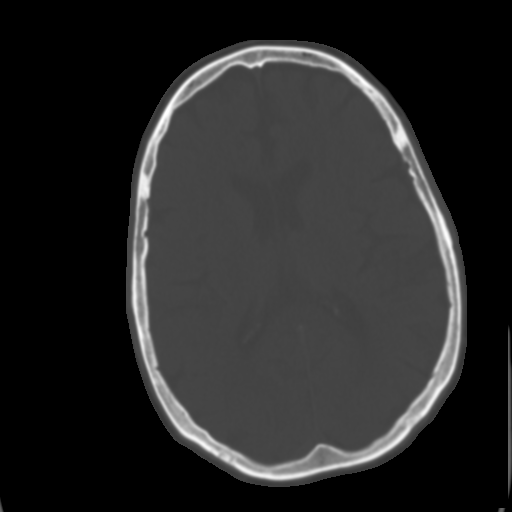
[im 19/34  brain]
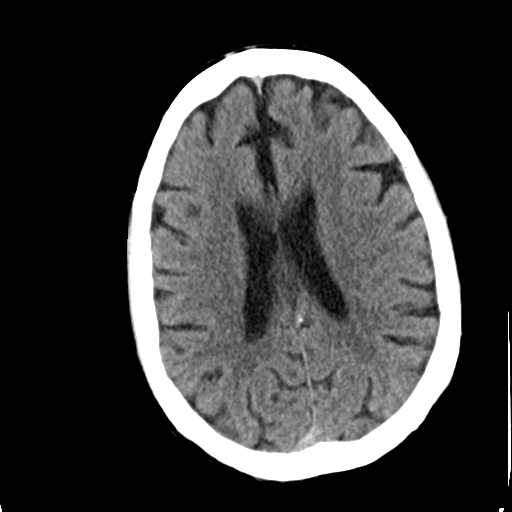
[im 24/34  brain]
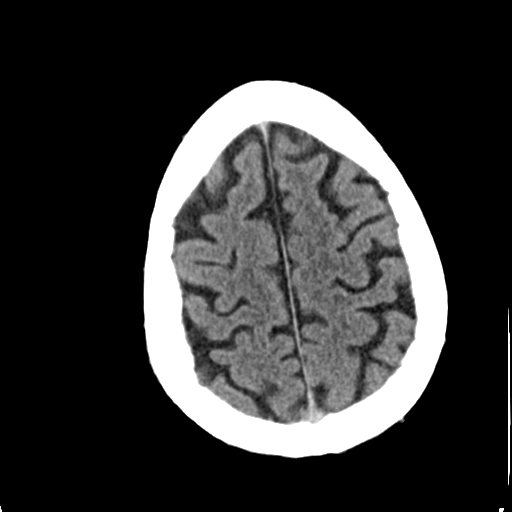
[im 26/34  brain]
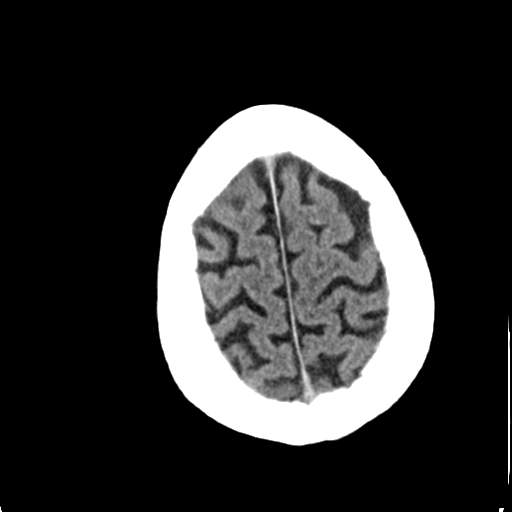
[im 31/34  brain]
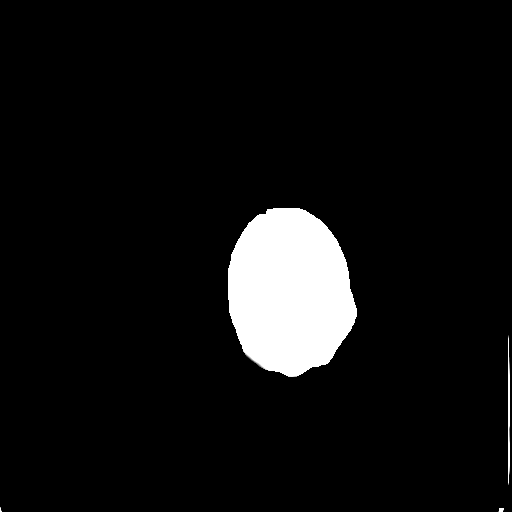
[im 31/34  bone]
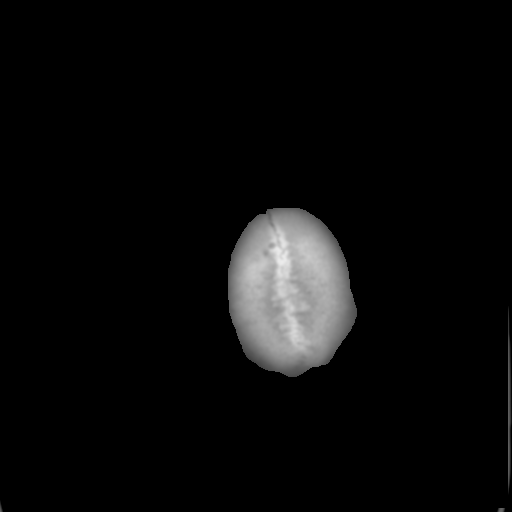

[Series 8: coronal soft · coronal · 0.34mm/px · 3 of 71 slices shown]
[im 24/71  brain]
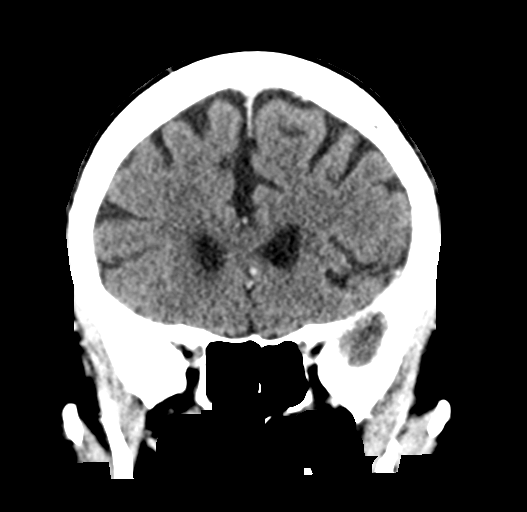
[im 32/71  brain]
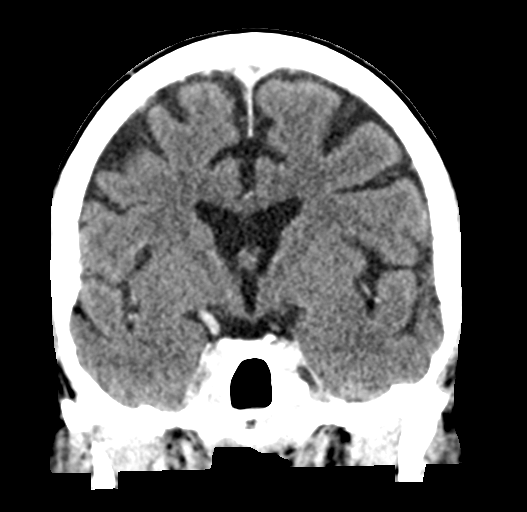
[im 39/71  brain]
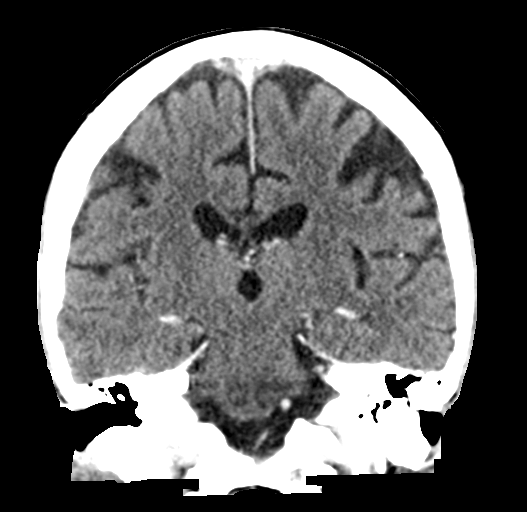

[Series 9: sagittal soft · sagittal · 0.35mm/px · 3 of 56 slices shown]
[im 19/56  brain]
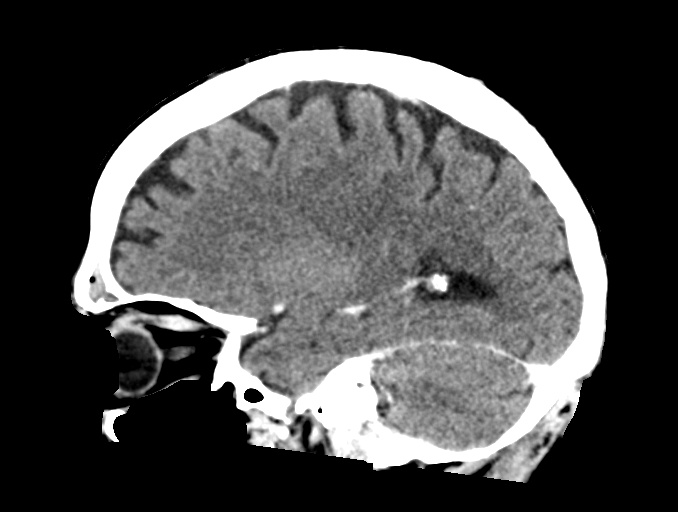
[im 28/56  brain]
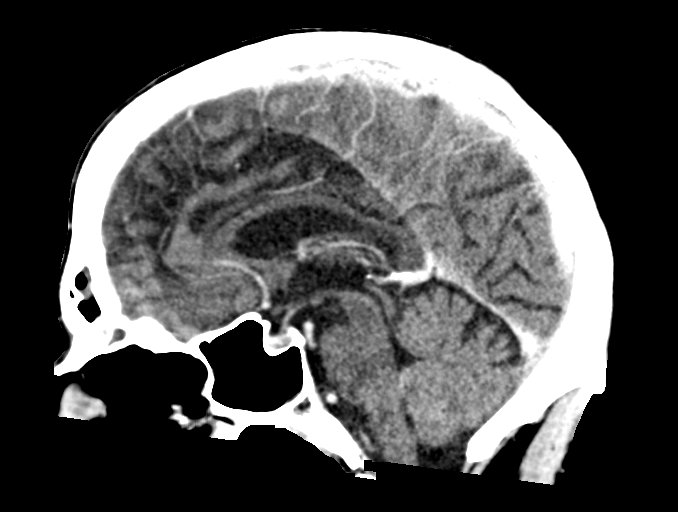
[im 37/56  brain]
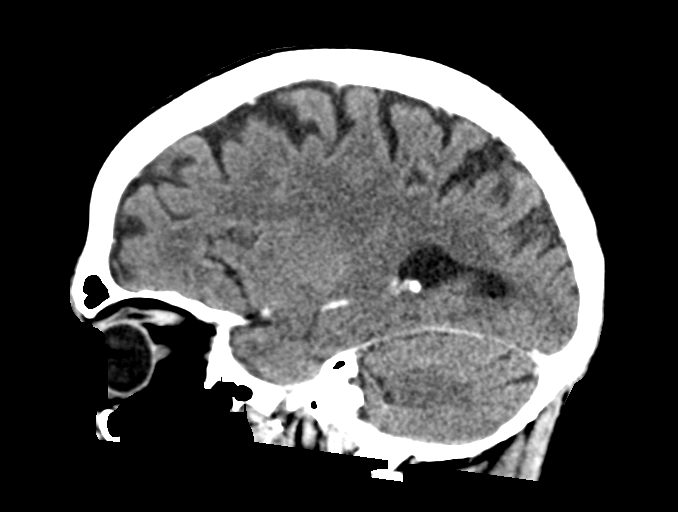

[15 of 47 positions shown; findings below may reference images not displayed]

FINDINGS: Brain: Cerebral volume is within normal limits for age. No midline
shift, ventriculomegaly, mass effect, evidence of mass lesion,
intracranial hemorrhage or evidence of cortically based acute
infarction.

Minimal nonspecific white matter hypodensity. No cortical
encephalomalacia identified.

No abnormal enhancement identified.

Vascular: The major intracranial vascular structures appear to be
normally enhancing. The left vertebral artery is dominant.

Skull: No destructive osseous lesion of the skull is identified.

Sinuses/Orbits: Largely opacified right frontal sinus and
frontoethmoidal recess with some superimposed bubbly opacity. Mild
bubbly opacity in the right maxillary sinus. Trace fluid layering in
the right sphenoid.

Tympanic cavities and mastoids appear clear.

Other: Negative orbits and scalp soft tissues.
IMPRESSION: 1. No metastatic disease or acute intracranial abnormality
identified.
2. Paranasal sinus disease, most pronounced in the right frontal
sinus.
